# Patient Record
Sex: Female | Born: 1970 | Race: Black or African American | Hispanic: No | Marital: Single | Smoking: Current every day smoker
Health system: Southern US, Community
[De-identification: ages and names within clinical notes are randomized; demographics above are authoritative.]

## PROBLEM LIST (undated history)

## (undated) DIAGNOSIS — F329 Major depressive disorder, single episode, unspecified: Secondary | ICD-10-CM

## (undated) DIAGNOSIS — I10 Essential (primary) hypertension: Secondary | ICD-10-CM

## (undated) DIAGNOSIS — D649 Anemia, unspecified: Secondary | ICD-10-CM

## (undated) DIAGNOSIS — G2581 Restless legs syndrome: Secondary | ICD-10-CM

## (undated) DIAGNOSIS — E079 Disorder of thyroid, unspecified: Secondary | ICD-10-CM

## (undated) HISTORY — PX: APPENDECTOMY: SHX54

## (undated) HISTORY — PX: TUBAL LIGATION: SHX77

---

## 2019-12-29 DIAGNOSIS — I10 Essential (primary) hypertension: Secondary | ICD-10-CM

## 2019-12-29 DIAGNOSIS — E039 Hypothyroidism, unspecified: Secondary | ICD-10-CM | POA: Diagnosis present

## 2020-01-01 DIAGNOSIS — Z0000 Encounter for general adult medical examination without abnormal findings: Secondary | ICD-10-CM | POA: Insufficient documentation

## 2021-01-12 DIAGNOSIS — F331 Major depressive disorder, recurrent, moderate: Secondary | ICD-10-CM | POA: Insufficient documentation

## 2021-01-12 DIAGNOSIS — D649 Anemia, unspecified: Secondary | ICD-10-CM | POA: Insufficient documentation

## 2021-01-12 DIAGNOSIS — G4700 Insomnia, unspecified: Secondary | ICD-10-CM | POA: Insufficient documentation

## 2021-01-12 DIAGNOSIS — F17200 Nicotine dependence, unspecified, uncomplicated: Secondary | ICD-10-CM | POA: Insufficient documentation

## 2021-01-12 DIAGNOSIS — N393 Stress incontinence (female) (male): Secondary | ICD-10-CM | POA: Insufficient documentation

## 2021-01-13 DIAGNOSIS — D509 Iron deficiency anemia, unspecified: Secondary | ICD-10-CM | POA: Diagnosis present

## 2021-11-01 DIAGNOSIS — D219 Benign neoplasm of connective and other soft tissue, unspecified: Secondary | ICD-10-CM | POA: Diagnosis not present

## 2021-11-01 DIAGNOSIS — N921 Excessive and frequent menstruation with irregular cycle: Secondary | ICD-10-CM | POA: Diagnosis not present

## 2021-12-09 ENCOUNTER — Ambulatory Visit
Admission: EM | Admit: 2021-12-09 | Discharge: 2021-12-09 | Disposition: A | Discharge status: Home / Self Care | Payer: BC Managed Care – PPO | Attending: Physician Assistant | Admitting: Physician Assistant

## 2021-12-09 ENCOUNTER — Other Ambulatory Visit: Payer: Self-pay

## 2021-12-09 DIAGNOSIS — I10 Essential (primary) hypertension: Secondary | ICD-10-CM

## 2021-12-09 DIAGNOSIS — R110 Nausea: Secondary | ICD-10-CM

## 2021-12-09 DIAGNOSIS — F5083 Pica in adults: Secondary | ICD-10-CM

## 2021-12-09 DIAGNOSIS — G2581 Restless legs syndrome: Secondary | ICD-10-CM | POA: Diagnosis not present

## 2021-12-09 DIAGNOSIS — F5089 Other specified eating disorder: Secondary | ICD-10-CM | POA: Diagnosis not present

## 2021-12-09 HISTORY — DX: Essential (primary) hypertension: I10

## 2021-12-09 HISTORY — DX: Disorder of thyroid, unspecified: E07.9

## 2021-12-09 LAB — POCT URINE PREGNANCY: Preg Test, Ur: NEGATIVE

## 2021-12-09 NOTE — ED Provider Notes (Signed)
[facility]    CSN: [id number] Arrival date & time: 12/09/21  0858      History   Chief Complaint Chief Complaint  Patient presents with   Nausea    HPI Ashley Orr is a 51 y.o. female.   Patient here today for evaluation of 2-week history of nausea, vomiting.  She states that she has also felt more cold than she typically would and has had a tendency to eat ice.  She does have known uterine fibroids and states she just moved to this area and does need a GYN.  She has known hypertension and has not been taking her medications daily.  She does not have a PCP in this area.  She does have history of thyroid disease as well and has not had her TSH checked recently.  She reports that she has also had some restless legs recently.   The history is provided by the patient.   Past Medical History:  Diagnosis Date   Hypertension    Thyroid disease     Patient Active Problem List   Diagnosis Date Noted   Essential hypertension 12/09/2021    History reviewed. No pertinent surgical history.  OB History   No obstetric history on file.      Home Medications    Prior to Admission medications   Medication Sig Start Date End Date Taking? Authorizing Provider  cyclobenzaprine (FLEXERIL) 10 MG tablet Take 1 tablet by mouth 2 (two) times daily as needed. 12/29/19  Yes [provider]  hydrochlorothiazide (HYDRODIURIL) 25 MG tablet Take by mouth. 01/12/21  Yes [provider]  levothyroxine (SYNTHROID) 200 MCG tablet TAKE 1 TABLET(200 MCG) BY MOUTH DAILY AT 6 AM 12/29/19  Yes [provider]  losartan (COZAAR) 50 MG tablet Take by mouth. 12/29/19  Yes [provider]  oxybutynin (DITROPAN-XL) 5 MG 24 hr tablet Take by mouth. 01/12/21  Yes [provider]  traZODone (DESYREL) 100 MG tablet Take by mouth. 01/12/21  Yes [provider]  venlafaxine XR (EFFEXOR-XR) 37.5 MG 24 hr capsule Take by mouth. 12/29/19  Yes  [provider]  citalopram (CELEXA) 40 MG tablet Take by mouth.    [provider]    Family History History reviewed. No pertinent family history.  Social History Social History   Tobacco Use   Smoking status: Every Day    Types: Cigarettes   Smokeless tobacco: Never     Allergies   Codeine   Review of Systems Review of Systems  Constitutional:  Negative for chills and fever.  Eyes:  Negative for discharge and redness.  Respiratory:  Negative for shortness of breath and wheezing.   Cardiovascular:  Negative for chest pain.  Gastrointestinal:  Positive for nausea and vomiting. Negative for abdominal pain.    Physical Exam Triage Vital Signs ED Triage Vitals  Enc Vitals Group     BP      Pulse      Resp      Temp      Temp src      SpO2      Weight      Height      Head Circumference      Peak Flow      Pain Score      Pain Loc      Pain Edu?      Excl. in [place]?    No data found.  Updated Vital Signs BP (!) 158/100 (BP  Location: Left Arm) Comment: Bp meds not taken yet   Pulse 77    Temp 97.7 F (36.5 C) (Oral)    Resp 18    LMP 09/24/2021 (Exact Date)    SpO2 95%      Physical Exam Vitals and nursing note reviewed.  Constitutional:      General: She is not in acute distress.    Appearance: Normal appearance. She is not ill-appearing.  HENT:     Head: Normocephalic and atraumatic.  Eyes:     Conjunctiva/sclera: Conjunctivae normal.  Cardiovascular:     Rate and Rhythm: Normal rate and regular rhythm.     Heart sounds: Normal heart sounds. No murmur heard. Pulmonary:     Effort: Pulmonary effort is normal. No respiratory distress.     Breath sounds: Normal breath sounds. No wheezing, rhonchi or rales.  Neurological:     Mental Status: She is alert.  Psychiatric:        Mood and Affect: Mood normal.        Behavior: Behavior normal.        Thought Content: Thought content normal.     UC Treatments / Results  Labs (all  labs ordered are listed, but only abnormal results are displayed) Labs Reviewed  CBC WITH DIFFERENTIAL/PLATELET  COMPREHENSIVE METABOLIC PANEL  TSH  POCT URINE PREGNANCY    EKG   Radiology No results found.  Procedures Procedures (including critical care time)  Medications Ordered in UC Medications - No data to display  Initial Impression / Assessment and Plan / UC Course  I have reviewed the triage vital signs and the nursing notes.  Pertinent labs & imaging results that were available during my care of the patient were reviewed by me and considered in my medical decision making (see chart for details).    Pregnancy test negative in office. Will order other labs for further evaluation. Contact info given for local GYN and discussed using her MyChart to establish care with local PCP as well. Encouraged follow up with any further concerns.   Final Clinical Impressions(s) / UC Diagnoses   Final diagnoses:  Pica in adults  Nausea  Restless legs  Essential hypertension     Discharge Instructions       Please follow up with any further concerns.      ED Prescriptions   None    PDMP not reviewed this encounter.   Francene Finders, PA-C 12/09/21 1053

## 2021-12-09 NOTE — Discharge Instructions (Signed)
°  Please follow up with any further concerns.

## 2021-12-09 NOTE — ED Triage Notes (Addendum)
2 week h/o nausea, fatigue and emesis. Pt also complains of feeling cold more often than normal for her. She also chews on ice often. No meds taken. Pt would like to test for pregnancy. Notes h/o fibroids. Denies URI sxs and diarrhea.

## 2021-12-10 LAB — COMPREHENSIVE METABOLIC PANEL
ALT: 11 IU/L (ref 0–32)
AST: 15 IU/L (ref 0–40)
Albumin/Globulin Ratio: 1.5 (ref 1.2–2.2)
Albumin: 4 g/dL (ref 3.8–4.9)
Alkaline Phosphatase: 77 IU/L (ref 44–121)
BUN/Creatinine Ratio: 14 (ref 9–23)
BUN: 11 mg/dL (ref 6–24)
Bilirubin Total: <0.2 mg/dL (ref 0.0–1.2)
CO2: 21 mmol/L (ref 20–29)
Calcium: 9 mg/dL (ref 8.7–10.2)
Chloride: 104 mmol/L (ref 96–106)
Creatinine, Ser: 0.76 mg/dL (ref 0.57–1.00)
Globulin, Total: 2.7 g/dL (ref 1.5–4.5)
Glucose: 75 mg/dL (ref 70–99)
Potassium: 3.9 mmol/L (ref 3.5–5.2)
Sodium: 139 mmol/L (ref 134–144)
Total Protein: 6.7 g/dL (ref 6.0–8.5)
eGFR: 94 mL/min/{1.73_m2} (ref >59)

## 2021-12-10 LAB — CBC WITH DIFFERENTIAL/PLATELET
Basophils Absolute: 0 10*3/uL (ref 0.0–0.2)
Basos: 0 %
EOS (ABSOLUTE): 0.1 10*3/uL (ref 0.0–0.4)
Eos: 1 %
Hematocrit: 27.3 % — ABNORMAL LOW (ref 34.0–46.6)
Hemoglobin: 7.6 g/dL — ABNORMAL LOW (ref 11.1–15.9)
Immature Grans (Abs): 0 10*3/uL (ref 0.0–0.1)
Immature Granulocytes: 0 %
Lymphocytes Absolute: 1.6 10*3/uL (ref 0.7–3.1)
Lymphs: 21 %
MCH: 20.1 pg — ABNORMAL LOW (ref 26.6–33.0)
MCHC: 27.8 g/dL — ABNORMAL LOW (ref 31.5–35.7)
MCV: 72 fL — ABNORMAL LOW (ref 79–97)
Monocytes Absolute: 0.7 10*3/uL (ref 0.1–0.9)
Monocytes: 9 %
Neutrophils Absolute: 5.1 10*3/uL (ref 1.4–7.0)
Neutrophils: 69 %
Platelets: 217 10*3/uL (ref 150–450)
RBC: 3.78 x10E6/uL (ref 3.77–5.28)
RDW: 17.7 % — ABNORMAL HIGH (ref 11.7–15.4)
WBC: 7.5 10*3/uL (ref 3.4–10.8)

## 2021-12-10 LAB — TSH: TSH: 24.8 u[IU]/mL — ABNORMAL HIGH (ref 0.450–4.500)

## 2021-12-16 ENCOUNTER — Other Ambulatory Visit: Payer: Self-pay

## 2021-12-16 ENCOUNTER — Ambulatory Visit
Admission: EM | Admit: 2021-12-16 | Discharge: 2021-12-16 | Disposition: A | Discharge status: Home / Self Care | Payer: BC Managed Care – PPO

## 2021-12-16 ENCOUNTER — Encounter (HOSPITAL_BASED_OUTPATIENT_CLINIC_OR_DEPARTMENT_OTHER): Payer: Self-pay | Admitting: Emergency Medicine

## 2021-12-16 DIAGNOSIS — B9562 Methicillin resistant Staphylococcus aureus infection as the cause of diseases classified elsewhere: Secondary | ICD-10-CM | POA: Diagnosis present

## 2021-12-16 DIAGNOSIS — E669 Obesity, unspecified: Secondary | ICD-10-CM | POA: Diagnosis present

## 2021-12-16 DIAGNOSIS — Z885 Allergy status to narcotic agent status: Secondary | ICD-10-CM

## 2021-12-16 DIAGNOSIS — R220 Localized swelling, mass and lump, head: Secondary | ICD-10-CM | POA: Diagnosis not present

## 2021-12-16 DIAGNOSIS — L03211 Cellulitis of face: Principal | ICD-10-CM | POA: Diagnosis present

## 2021-12-16 DIAGNOSIS — F17210 Nicotine dependence, cigarettes, uncomplicated: Secondary | ICD-10-CM | POA: Diagnosis present

## 2021-12-16 DIAGNOSIS — D500 Iron deficiency anemia secondary to blood loss (chronic): Secondary | ICD-10-CM | POA: Diagnosis present

## 2021-12-16 DIAGNOSIS — Z79899 Other long term (current) drug therapy: Secondary | ICD-10-CM

## 2021-12-16 DIAGNOSIS — F419 Anxiety disorder, unspecified: Secondary | ICD-10-CM | POA: Diagnosis present

## 2021-12-16 DIAGNOSIS — Z20822 Contact with and (suspected) exposure to covid-19: Secondary | ICD-10-CM | POA: Diagnosis not present

## 2021-12-16 DIAGNOSIS — D259 Leiomyoma of uterus, unspecified: Secondary | ICD-10-CM | POA: Diagnosis not present

## 2021-12-16 DIAGNOSIS — E039 Hypothyroidism, unspecified: Secondary | ICD-10-CM | POA: Diagnosis present

## 2021-12-16 DIAGNOSIS — Z79890 Hormone replacement therapy (postmenopausal): Secondary | ICD-10-CM | POA: Diagnosis not present

## 2021-12-16 DIAGNOSIS — N920 Excessive and frequent menstruation with regular cycle: Secondary | ICD-10-CM | POA: Diagnosis present

## 2021-12-16 DIAGNOSIS — F32A Depression, unspecified: Secondary | ICD-10-CM | POA: Diagnosis present

## 2021-12-16 DIAGNOSIS — Z6836 Body mass index (BMI) 36.0-36.9, adult: Secondary | ICD-10-CM | POA: Diagnosis not present

## 2021-12-16 DIAGNOSIS — Z87892 Personal history of anaphylaxis: Secondary | ICD-10-CM | POA: Diagnosis not present

## 2021-12-16 DIAGNOSIS — M272 Inflammatory conditions of jaws: Secondary | ICD-10-CM | POA: Diagnosis not present

## 2021-12-16 DIAGNOSIS — I10 Essential (primary) hypertension: Secondary | ICD-10-CM | POA: Diagnosis present

## 2021-12-16 LAB — COMPREHENSIVE METABOLIC PANEL
ALT: 8 U/L (ref 0–44)
AST: 13 U/L — ABNORMAL LOW (ref 15–41)
Albumin: 4 g/dL (ref 3.5–5.0)
Alkaline Phosphatase: 64 U/L (ref 38–126)
Anion gap: 9 (ref 5–15)
BUN: 12 mg/dL (ref 6–20)
CO2: 22 mmol/L (ref 22–32)
Calcium: 9 mg/dL (ref 8.9–10.3)
Chloride: 107 mmol/L (ref 98–111)
Creatinine, Ser: 0.91 mg/dL (ref 0.44–1.00)
GFR, Estimated: >60 mL/min (ref >60)
Glucose, Bld: 118 mg/dL — ABNORMAL HIGH (ref 70–99)
Potassium: 3.7 mmol/L (ref 3.5–5.1)
Sodium: 138 mmol/L (ref 135–145)
Total Bilirubin: 0.2 mg/dL — ABNORMAL LOW (ref 0.3–1.2)
Total Protein: 7.2 g/dL (ref 6.5–8.1)

## 2021-12-16 LAB — CBC WITH DIFFERENTIAL/PLATELET
Abs Immature Granulocytes: 0.03 10*3/uL (ref 0.00–0.07)
Basophils Absolute: 0.1 10*3/uL (ref 0.0–0.1)
Basophils Relative: 1 %
Eosinophils Absolute: 0.1 10*3/uL (ref 0.0–0.5)
Eosinophils Relative: 1 %
HCT: 27.8 % — ABNORMAL LOW (ref 36.0–46.0)
Hemoglobin: 7.8 g/dL — ABNORMAL LOW (ref 12.0–15.0)
Immature Granulocytes: 0 %
Lymphocytes Relative: 18 %
Lymphs Abs: 1.9 10*3/uL (ref 0.7–4.0)
MCH: 19.7 pg — ABNORMAL LOW (ref 26.0–34.0)
MCHC: 28.1 g/dL — ABNORMAL LOW (ref 30.0–36.0)
MCV: 70.4 fL — ABNORMAL LOW (ref 80.0–100.0)
Monocytes Absolute: 0.8 10*3/uL (ref 0.1–1.0)
Monocytes Relative: 8 %
Neutro Abs: 7.8 10*3/uL — ABNORMAL HIGH (ref 1.7–7.7)
Neutrophils Relative %: 72 %
Platelets: 274 10*3/uL (ref 150–400)
RBC: 3.95 MIL/uL (ref 3.87–5.11)
RDW: 19.5 % — ABNORMAL HIGH (ref 11.5–15.5)
WBC: 10.7 10*3/uL — ABNORMAL HIGH (ref 4.0–10.5)
nRBC: 0 % (ref 0.0–0.2)

## 2021-12-16 MED ORDER — IBUPROFEN 400 MG PO TABS
400.0000 mg | ORAL_TABLET | Freq: Once | ORAL | Status: AC | PRN
  Administered 2021-12-16: 400 mg via ORAL
  Filled 2021-12-16: qty 1

## 2021-12-16 NOTE — Discharge Instructions (Signed)
Please go to the emergency department as soon as you leave urgent care for further evaluation and management. ?

## 2021-12-16 NOTE — ED Provider Notes (Signed)
[facility]    CSN: [id number] Arrival date & time: 12/16/21  1643      History   Chief Complaint Chief Complaint  Patient presents with   Abscess    HPI Ashley Orr is a 51 y.o. female.   Patient presents with abscess to left jaw that started approximately 5 days ago.  Patient reports that it started off small, and she decided to pop it with some drainage.  It has persistently gotten bigger since it originated.  Denies any fevers, body aches, chills.  Has had some purulent drainage.   Abscess  Past Medical History:  Diagnosis Date   Hypertension    Thyroid disease     Patient Active Problem List   Diagnosis Date Noted   Essential hypertension 12/09/2021    History reviewed. No pertinent surgical history.  OB History   No obstetric history on file.      Home Medications    Prior to Admission medications   Medication Sig Start Date End Date Taking? Authorizing Provider  citalopram (CELEXA) 40 MG tablet Take by mouth.    [provider]  cyclobenzaprine (FLEXERIL) 10 MG tablet Take 1 tablet by mouth 2 (two) times daily as needed. 12/29/19   [provider]  hydrochlorothiazide (HYDRODIURIL) 25 MG tablet Take by mouth. 01/12/21   [provider]  levothyroxine (SYNTHROID) 200 MCG tablet TAKE 1 TABLET(200 MCG) BY MOUTH DAILY AT 6 AM 12/29/19   [provider]  losartan (COZAAR) 50 MG tablet Take by mouth. 12/29/19   [provider]  oxybutynin (DITROPAN-XL) 5 MG 24 hr tablet Take by mouth. 01/12/21   [provider]  traZODone (DESYREL) 100 MG tablet Take by mouth. 01/12/21   [provider]  venlafaxine XR (EFFEXOR-XR) 37.5 MG 24 hr capsule Take by mouth. 12/29/19   [provider]    Family History History reviewed. No pertinent family history.  Social History Social History   Tobacco Use   Smoking status: Every Day    Types: Cigarettes   Smokeless tobacco: Never      Allergies   Codeine   Review of Systems Review of Systems Per HPI  Physical Exam Triage Vital Signs ED Triage Vitals [12/16/21 1745]  Enc Vitals Group     BP (!) 156/88     Pulse Rate 95     Resp 18     Temp 99.2 F (37.3 C)     Temp Source Oral     SpO2 98 %     Weight      Height      Head Circumference      Peak Flow      Pain Score 0     Pain Loc      Pain Edu?      Excl. in [place]?    No data found.  Updated Vital Signs BP (!) 156/88 (BP Location: Left Arm)    Pulse 95    Temp 99.2 F (37.3 C) (Oral)    Resp 18    SpO2 98%   Visual Acuity Right Eye Distance:   Left Eye Distance:   Bilateral Distance:    Right Eye Near:   Left Eye Near:    Bilateral Near:     Physical Exam Constitutional:      General: She is not in acute distress.    Appearance: Normal appearance. She is not toxic-appearing or diaphoretic.  HENT:     Head: Normocephalic and  atraumatic.  Eyes:     Extraocular Movements: Extraocular movements intact.     Conjunctiva/sclera: Conjunctivae normal.  Pulmonary:     Effort: Pulmonary effort is normal.  Skin:    General: Skin is warm and dry.     Comments: Patient has approximately 2.5 cm indurated abscess present to left chin.  No drainage noted at this time.  Patient is able to open jaw.   Neurological:     General: No focal deficit present.     Mental Status: She is alert and oriented to person, place, and time. Mental status is at baseline.  Psychiatric:        Mood and Affect: Mood normal.        Behavior: Behavior normal.        Thought Content: Thought content normal.        Judgment: Judgment normal.     UC Treatments / Results  Labs (all labs ordered are listed, but only abnormal results are displayed) Labs Reviewed - No data to display  EKG   Radiology No results found.  Procedures Procedures (including critical care time)  Medications Ordered in UC Medications - No data to display  Initial Impression /  Assessment and Plan / UC Course  I have reviewed the triage vital signs and the nursing notes.  Pertinent labs & imaging results that were available during my care of the patient were reviewed by me and considered in my medical decision making (see chart for details).     Patient has abscess to left jaw.  It does appear that it will need drainage.  Unable to perform drainage of this abscess given location at urgent care given safety risk.  Advised patient that she would need to go to the hospital for more extensive evaluation and treatment for abscess.  Patient was agreeable with plan.  Vital signs stable at discharge.  Agree with patient self transport to the hospital. Final Clinical Impressions(s) / UC Diagnoses   Final diagnoses:  Abscess, jaw     Discharge Instructions      Please go to the emergency department as soon as you leave urgent care for further evaluation and management.    ED Prescriptions   None    PDMP not reviewed this encounter.   Teodora Medici, [place] 12/16/21 1816

## 2021-12-16 NOTE — ED Triage Notes (Signed)
Pt c/o abscess to left jaw just under her lip. States she tried "popping" it and successfully drained the discharge however it is still present, it's hard, and painful.   Onset ~ Monday

## 2021-12-16 NOTE — ED Triage Notes (Signed)
Pt presents for abscess on L chin that began Monday and become progressively worse. Now experiencing swelling to L chin, neck, trouble swallowing, drooling (not noted during triage exam), pain (10/10), yellow drainage. Has tried warm compresses.

## 2021-12-17 ENCOUNTER — Inpatient Hospital Stay (HOSPITAL_BASED_OUTPATIENT_CLINIC_OR_DEPARTMENT_OTHER)
Admission: EM | Admit: 2021-12-17 | Discharge: 2021-12-19 | DRG: 603 | Disposition: A | Discharge status: Home / Self Care | Payer: BC Managed Care – PPO | Attending: Internal Medicine | Admitting: Internal Medicine

## 2021-12-17 ENCOUNTER — Encounter (HOSPITAL_BASED_OUTPATIENT_CLINIC_OR_DEPARTMENT_OTHER): Payer: Self-pay | Admitting: Family Medicine

## 2021-12-17 ENCOUNTER — Emergency Department (HOSPITAL_BASED_OUTPATIENT_CLINIC_OR_DEPARTMENT_OTHER): Payer: BC Managed Care – PPO

## 2021-12-17 DIAGNOSIS — R220 Localized swelling, mass and lump, head: Secondary | ICD-10-CM | POA: Diagnosis not present

## 2021-12-17 DIAGNOSIS — B9562 Methicillin resistant Staphylococcus aureus infection as the cause of diseases classified elsewhere: Secondary | ICD-10-CM | POA: Diagnosis present

## 2021-12-17 DIAGNOSIS — Z6836 Body mass index (BMI) 36.0-36.9, adult: Secondary | ICD-10-CM | POA: Diagnosis not present

## 2021-12-17 DIAGNOSIS — F17210 Nicotine dependence, cigarettes, uncomplicated: Secondary | ICD-10-CM | POA: Diagnosis present

## 2021-12-17 DIAGNOSIS — Z20822 Contact with and (suspected) exposure to covid-19: Secondary | ICD-10-CM | POA: Diagnosis present

## 2021-12-17 DIAGNOSIS — Z87892 Personal history of anaphylaxis: Secondary | ICD-10-CM | POA: Diagnosis not present

## 2021-12-17 DIAGNOSIS — D500 Iron deficiency anemia secondary to blood loss (chronic): Secondary | ICD-10-CM | POA: Diagnosis present

## 2021-12-17 DIAGNOSIS — L03211 Cellulitis of face: Secondary | ICD-10-CM | POA: Diagnosis not present

## 2021-12-17 DIAGNOSIS — E669 Obesity, unspecified: Secondary | ICD-10-CM | POA: Diagnosis present

## 2021-12-17 DIAGNOSIS — Z79899 Other long term (current) drug therapy: Secondary | ICD-10-CM | POA: Diagnosis not present

## 2021-12-17 DIAGNOSIS — N920 Excessive and frequent menstruation with regular cycle: Secondary | ICD-10-CM | POA: Diagnosis present

## 2021-12-17 DIAGNOSIS — D509 Iron deficiency anemia, unspecified: Secondary | ICD-10-CM | POA: Diagnosis present

## 2021-12-17 DIAGNOSIS — Z79890 Hormone replacement therapy (postmenopausal): Secondary | ICD-10-CM | POA: Diagnosis not present

## 2021-12-17 DIAGNOSIS — I10 Essential (primary) hypertension: Secondary | ICD-10-CM | POA: Diagnosis present

## 2021-12-17 DIAGNOSIS — F419 Anxiety disorder, unspecified: Secondary | ICD-10-CM | POA: Diagnosis present

## 2021-12-17 DIAGNOSIS — D259 Leiomyoma of uterus, unspecified: Secondary | ICD-10-CM | POA: Diagnosis present

## 2021-12-17 DIAGNOSIS — F32A Depression, unspecified: Secondary | ICD-10-CM | POA: Diagnosis present

## 2021-12-17 DIAGNOSIS — E039 Hypothyroidism, unspecified: Secondary | ICD-10-CM | POA: Diagnosis present

## 2021-12-17 DIAGNOSIS — Z885 Allergy status to narcotic agent status: Secondary | ICD-10-CM | POA: Diagnosis not present

## 2021-12-17 HISTORY — DX: Hypothyroidism, unspecified: E03.9

## 2021-12-17 LAB — RESP PANEL BY RT-PCR (FLU A&B, COVID) ARPGX2
Influenza A by PCR: NEGATIVE
Influenza B by PCR: NEGATIVE
SARS Coronavirus 2 by RT PCR: NEGATIVE

## 2021-12-17 LAB — LACTIC ACID, PLASMA: Lactic Acid, Venous: 0.6 mmol/L (ref 0.5–1.9)

## 2021-12-17 LAB — HIV ANTIBODY (ROUTINE TESTING W REFLEX): HIV Screen 4th Generation wRfx: NONREACTIVE

## 2021-12-17 MED ORDER — SODIUM CHLORIDE 0.9% FLUSH
10.0000 mL | Freq: Two times a day (BID) | INTRAVENOUS | Status: DC
  Administered 2021-12-17 – 2021-12-19 (×4): 10 mL

## 2021-12-17 MED ORDER — LEVOTHYROXINE SODIUM 100 MCG PO TABS
200.0000 ug | ORAL_TABLET | ORAL | Status: DC
  Administered 2021-12-18: 200 ug via ORAL
  Filled 2021-12-17 (×2): qty 2

## 2021-12-17 MED ORDER — BISACODYL 5 MG PO TBEC: 5.0000 mg | DELAYED_RELEASE_TABLET | Freq: Every day | ORAL | Status: DC | PRN

## 2021-12-17 MED ORDER — DOCUSATE SODIUM 100 MG PO CAPS
100.0000 mg | ORAL_CAPSULE | Freq: Two times a day (BID) | ORAL | Status: DC
  Administered 2021-12-17 – 2021-12-19 (×2): 100 mg via ORAL
  Filled 2021-12-17 (×3): qty 1

## 2021-12-17 MED ORDER — HYDRALAZINE HCL 20 MG/ML IJ SOLN: 5.0000 mg | INTRAMUSCULAR | Status: DC | PRN

## 2021-12-17 MED ORDER — VANCOMYCIN HCL 2000 MG/400ML IV SOLN
2000.0000 mg | Freq: Once | INTRAVENOUS | Status: DC
  Filled 2021-12-17: qty 400

## 2021-12-17 MED ORDER — LOSARTAN POTASSIUM 50 MG PO TABS
50.0000 mg | ORAL_TABLET | Freq: Every day | ORAL | Status: DC
  Administered 2021-12-17 – 2021-12-19 (×3): 50 mg via ORAL
  Filled 2021-12-17 (×3): qty 1

## 2021-12-17 MED ORDER — CHLORHEXIDINE GLUCONATE CLOTH 2 % EX PADS
6.0000 | MEDICATED_PAD | Freq: Every day | CUTANEOUS | Status: DC
  Administered 2021-12-17: 6 via TOPICAL

## 2021-12-17 MED ORDER — VANCOMYCIN HCL IN DEXTROSE 1-5 GM/200ML-% IV SOLN
1000.0000 mg | Freq: Once | INTRAVENOUS | Status: AC
  Administered 2021-12-17: 1000 mg via INTRAVENOUS
  Filled 2021-12-17: qty 200

## 2021-12-17 MED ORDER — ONDANSETRON HCL 4 MG PO TABS: 4.0000 mg | ORAL_TABLET | Freq: Four times a day (QID) | ORAL | Status: DC | PRN

## 2021-12-17 MED ORDER — SODIUM CHLORIDE 0.9 % IV SOLN
2.0000 g | Freq: Three times a day (TID) | INTRAVENOUS | Status: DC
  Administered 2021-12-17 – 2021-12-18 (×3): 2 g via INTRAVENOUS
  Filled 2021-12-17 (×3): qty 2

## 2021-12-17 MED ORDER — LEVOTHYROXINE SODIUM 75 MCG PO TABS
175.0000 ug | ORAL_TABLET | ORAL | Status: DC
  Administered 2021-12-17 – 2021-12-19 (×2): 175 ug via ORAL
  Filled 2021-12-17 (×3): qty 1

## 2021-12-17 MED ORDER — ENOXAPARIN SODIUM 40 MG/0.4ML IJ SOSY
40.0000 mg | PREFILLED_SYRINGE | INTRAMUSCULAR | Status: DC
  Administered 2021-12-17 – 2021-12-18 (×2): 40 mg via SUBCUTANEOUS
  Filled 2021-12-17 (×2): qty 0.4

## 2021-12-17 MED ORDER — SODIUM CHLORIDE 0.9 % IV SOLN
2.0000 g | Freq: Three times a day (TID) | INTRAVENOUS | Status: DC
  Filled 2021-12-17: qty 2

## 2021-12-17 MED ORDER — ACETAMINOPHEN 325 MG PO TABS
650.0000 mg | ORAL_TABLET | Freq: Four times a day (QID) | ORAL | Status: DC | PRN
  Administered 2021-12-17 – 2021-12-18 (×3): 650 mg via ORAL
  Filled 2021-12-17 (×3): qty 2

## 2021-12-17 MED ORDER — ONDANSETRON HCL 4 MG/2ML IJ SOLN: 4.0000 mg | Freq: Four times a day (QID) | INTRAMUSCULAR | Status: DC | PRN

## 2021-12-17 MED ORDER — HYDROCHLOROTHIAZIDE 25 MG PO TABS
25.0000 mg | ORAL_TABLET | Freq: Every day | ORAL | Status: DC
  Administered 2021-12-17 – 2021-12-19 (×3): 25 mg via ORAL
  Filled 2021-12-17 (×3): qty 1

## 2021-12-17 MED ORDER — POLYETHYLENE GLYCOL 3350 17 G PO PACK: 17.0000 g | PACK | Freq: Every day | ORAL | Status: DC | PRN

## 2021-12-17 MED ORDER — CYCLOBENZAPRINE HCL 10 MG PO TABS
10.0000 mg | ORAL_TABLET | Freq: Two times a day (BID) | ORAL | Status: DC | PRN
  Administered 2021-12-18: 10 mg via ORAL
  Filled 2021-12-17 (×2): qty 1

## 2021-12-17 MED ORDER — KETOROLAC TROMETHAMINE 15 MG/ML IJ SOLN
15.0000 mg | Freq: Once | INTRAMUSCULAR | Status: AC
  Administered 2021-12-17: 15 mg via INTRAVENOUS
  Filled 2021-12-17: qty 1

## 2021-12-17 MED ORDER — SODIUM CHLORIDE 0.9 % IV SOLN
2.0000 g | INTRAVENOUS | Status: AC
  Administered 2021-12-17: 2 g via INTRAVENOUS
  Filled 2021-12-17: qty 2

## 2021-12-17 MED ORDER — VENLAFAXINE HCL ER 37.5 MG PO CP24
37.5000 mg | ORAL_CAPSULE | Freq: Every day | ORAL | Status: DC
  Administered 2021-12-17 – 2021-12-19 (×3): 37.5 mg via ORAL
  Filled 2021-12-17 (×3): qty 1

## 2021-12-17 MED ORDER — OXYBUTYNIN CHLORIDE ER 5 MG PO TB24
5.0000 mg | ORAL_TABLET | Freq: Every evening | ORAL | Status: DC
  Administered 2021-12-17 – 2021-12-18 (×2): 5 mg via ORAL
  Filled 2021-12-17 (×3): qty 1

## 2021-12-17 MED ORDER — NICOTINE 14 MG/24HR TD PT24: 14.0000 mg | MEDICATED_PATCH | Freq: Every day | TRANSDERMAL | Status: DC | PRN

## 2021-12-17 MED ORDER — SODIUM CHLORIDE 0.9% FLUSH: 10.0000 mL | INTRAVENOUS | Status: DC | PRN

## 2021-12-17 MED ORDER — TRAZODONE HCL 100 MG PO TABS
100.0000 mg | ORAL_TABLET | Freq: Every day | ORAL | Status: DC
  Administered 2021-12-17 – 2021-12-18 (×2): 100 mg via ORAL
  Filled 2021-12-17 (×2): qty 1

## 2021-12-17 MED ORDER — VANCOMYCIN HCL 1250 MG/250ML IV SOLN
1250.0000 mg | INTRAVENOUS | Status: DC
  Administered 2021-12-18 – 2021-12-19 (×2): 1250 mg via INTRAVENOUS
  Filled 2021-12-17 (×2): qty 250

## 2021-12-17 MED ORDER — KETOROLAC TROMETHAMINE 15 MG/ML IJ SOLN
15.0000 mg | Freq: Four times a day (QID) | INTRAMUSCULAR | Status: DC | PRN
  Administered 2021-12-17 – 2021-12-19 (×5): 15 mg via INTRAVENOUS
  Filled 2021-12-17 (×5): qty 1

## 2021-12-17 MED ORDER — IOHEXOL 300 MG/ML  SOLN
100.0000 mL | Freq: Once | INTRAMUSCULAR | Status: AC | PRN
  Administered 2021-12-17: 75 mL via INTRAVENOUS

## 2021-12-17 MED ORDER — ACETAMINOPHEN 650 MG RE SUPP: 650.0000 mg | Freq: Four times a day (QID) | RECTAL | Status: DC | PRN

## 2021-12-17 MED ORDER — FERROUS SULFATE 325 (65 FE) MG PO TABS
325.0000 mg | ORAL_TABLET | Freq: Every day | ORAL | Status: DC
  Administered 2021-12-17 – 2021-12-19 (×3): 325 mg via ORAL
  Filled 2021-12-17 (×3): qty 1

## 2021-12-17 MED ORDER — KETOROLAC TROMETHAMINE 60 MG/2ML IM SOLN
30.0000 mg | Freq: Once | INTRAMUSCULAR | Status: AC
Start: 2021-12-17 — End: 2021-12-17
  Administered 2021-12-17: 30 mg via INTRAMUSCULAR
  Filled 2021-12-17: qty 2

## 2021-12-17 MED ORDER — CITALOPRAM HYDROBROMIDE 40 MG PO TABS
40.0000 mg | ORAL_TABLET | Freq: Every day | ORAL | Status: DC
  Administered 2021-12-17 – 2021-12-19 (×3): 40 mg via ORAL
  Filled 2021-12-17 (×3): qty 1

## 2021-12-17 NOTE — ED Notes (Signed)
Note about patient being called with no answer was made in error

## 2021-12-17 NOTE — ED Notes (Signed)
Handoff report given to [facility] 

## 2021-12-17 NOTE — H&P (Signed)
History and Physical    Patient: Ashley Orr HEN:[id number] DOB: [birth year] DOA: 12/17/2021 DOS: the patient was seen and examined on 12/17/2021 PCP: Pcp, No  Patient coming from: Home - lives with fiance; NOKValerie Roys, [phone]   Chief Complaint: Jaw abscess  HPI: Ashley Orr is a 51 y.o. female with medical history significant of HTN and hypothyroidism presenting with jaw abscess.  She reports that she was living in [place] and recently moved to [place]; she has not established care here.  She reports that she has several bad teeth and developed a left lower posterior tooth abscess recently; it resolved and now she has a skin tag there.  Meanwhile, she noticed a pimple on her L mandibular region on Monday and it got worse.  It came to a head and she popped it with some relief, but then it started getting swollen, firm, and red in the region with pain.  The redness and swelling extended onto her neck and she decided to go to urgent care and then was sent to the ER.  No fevers.    ER Course:  [facility] transfer, per Dr. Tonie Griffith:  51 yo with hx of HTN, thyroid disease presents with facial cellulitis. No abscess on CT scan. Has worsened over past week. Airway patent with no respiratory compromise.      Review of Systems: As mentioned in the history of present illness. All other systems reviewed and are negative. Past Medical History:  Diagnosis Date   Hypertension    Hypothyroidism (acquired) 12/17/2021   Thyroid disease    History reviewed. No pertinent surgical history. Social History:  reports that she has been smoking cigarettes. She has never used smokeless tobacco. No history on file for alcohol use and drug use.  Allergies  Allergen Reactions   Codeine Anaphylaxis    History reviewed. No pertinent family history.  Prior to Admission medications   Medication Sig Start Date End Date Taking? Authorizing Provider  citalopram  (CELEXA) 40 MG tablet Take by mouth.    [provider]  cyclobenzaprine (FLEXERIL) 10 MG tablet Take 1 tablet by mouth 2 (two) times daily as needed. 12/29/19   [provider]  hydrochlorothiazide (HYDRODIURIL) 25 MG tablet Take by mouth. 01/12/21   [provider]  levothyroxine (SYNTHROID) 200 MCG tablet TAKE 1 TABLET(200 MCG) BY MOUTH DAILY AT 6 AM 12/29/19   [provider]  losartan (COZAAR) 50 MG tablet Take by mouth. 12/29/19   [provider]  oxybutynin (DITROPAN-XL) 5 MG 24 hr tablet Take by mouth. 01/12/21   [provider]  traZODone (DESYREL) 100 MG tablet Take by mouth. 01/12/21   [provider]  venlafaxine XR (EFFEXOR-XR) 37.5 MG 24 hr capsule Take by mouth. 12/29/19   [provider]    Physical Exam: Vitals:   12/17/21 0205 12/17/21 0243 12/17/21 0730 12/17/21 1028  BP: (!) 166/106 (!) 149/93 (!) 154/103 (!) 162/88  Pulse: 80 81 78 70  Resp: 18 18 18 18   Temp:    98 F (36.7 C)  TempSrc:    Oral  SpO2: 100% 100% 100% 100%  Weight:       General:  Appears calm and comfortable and is in NAD Eyes:  EOMI, normal lids, iris ENT:  grossly normal hearing, lips & tongue, mmm; poor dentition without apparent abscess; L mandibular pustule with drainage and surrounding erythema/edema/fluctuance      Neck:  no LAD, masses or thyromegaly; small  chronic nodule on R submandibular region; mild erythema extending onto neck Cardiovascular:  RRR, no m/r/g. No LE edema.  Respiratory:   CTA bilaterally with no wheezes/rales/rhonchi.  Normal respiratory effort. Abdomen:  soft, NT, ND Skin:  pustule on L chin, as noted above, with surrounding erythema; no other lesions noted Musculoskeletal:  grossly normal tone BUE/BLE, good ROM, no bony abnormality Psychiatric:  grossly normal mood and affect, speech fluent and appropriate, AOx3 Neurologic:  CN 2-12 grossly intact, moves all extremities in coordinated  fashion   Radiological Exams on Admission: Independently reviewed - see discussion in A/P where applicable  CT Maxillofacial W Contrast  Result Date: 12/17/2021 CLINICAL DATA:  Facial abscess EXAM: CT MAXILLOFACIAL WITH CONTRAST TECHNIQUE: Multidetector CT imaging of the maxillofacial structures was performed with intravenous contrast. Multiplanar CT image reconstructions were also generated. RADIATION DOSE REDUCTION: This exam was performed according to the departmental dose-optimization program which includes automated exposure control, adjustment of the mA and/or kV according to patient size and/or use of iterative reconstruction technique. CONTRAST:  54mL OMNIPAQUE IOHEXOL 300 MG/ML  SOLN COMPARISON:  None. FINDINGS: Osseous: No facial or mandibular fracture. Orbits: The globes are intact. Normal appearance of the intra- and extraconal fat. Symmetric extraocular muscles. Sinuses: No fluid levels or advanced mucosal thickening. Soft tissues: Left submandibular cutaneous lesion with underlying edema. No fluid collection. Limited intracranial: Normal. Other: None. IMPRESSION: Left submandibular cutaneous lesion with underlying edema, likely cellulitis. No fluid collection. Electronically Signed   By: Ulyses Jarred M.D.   On: 12/17/2021 01:47    EKG: not done   Labs on Admission: I have personally reviewed the available labs and imaging studies at the time of the admission.  Pertinent labs:    Glucose 118 WBC 10.7 Hgb 7.8 - stable MCH 70.4, RDW 19.5 TSH 24.8 on 1/20 COVID/flu negative   Assessment/Plan * Cellulitis of face- (present on admission) -Patient without prior h/o abscess presenting with pustule along left chin and surrounding cellulitis -Imaging does not show concern for abscess formation and pustule is already draining -She was given Vanc and cefepime in the ER, will continue for now -Will admit to Med Surg for ongoing abx therapy  Iron deficiency anemia due to chronic  blood loss- (present on admission) -Known uterine fibroids -Hgb 7.8 (stable from 1/20) and she started her menses today -She often feels fatigued with menses -She started taking FeSO4 yesterday, will continue -Needs outpatient GYN f/u  Hypothyroidism (acquired)- (present on admission) -She had not been taking Synthroid and so recent TSH was markedly abnormal -Medication has been resumed -Needs repeat thyroid testing in 4-6 weeks  Essential hypertension- (present on admission) -Continue Cozaar, HCTZ -Needs outpatient PCP, TOC consult requested    Advance Care Planning:   Code Status: Full Code   Consults: TOC team  Family Communication: None present; her fiance was present on the telephone throughout most of our visit  Severity of Illness: The appropriate patient status for this patient is INPATIENT. Inpatient status is judged to be reasonable and necessary in order to provide the required intensity of service to ensure the patient's safety. [name] presenting symptoms, physical exam findings, and initial radiographic and laboratory data in the context of their chronic comorbidities is felt to place them at high risk for further clinical deterioration. Furthermore, it is not anticipated that the patient will be medically stable for discharge from the hospital within 2 midnights of admission.   * I certify that at the point of admission it is  my clinical judgment that the patient will require inpatient hospital care spanning beyond 2 midnights from the point of admission due to high intensity of service, high risk for further deterioration and high frequency of surveillance required.*  Author: Karmen Bongo, MD 12/17/2021 12:22 PM  For on call review [email or web address].

## 2021-12-17 NOTE — Assessment & Plan Note (Signed)
-  She had not been taking Synthroid and so recent TSH was markedly abnormal -Medication has been resumed -Needs repeat thyroid testing in 4-6 weeks

## 2021-12-17 NOTE — ED Provider Notes (Signed)
[facility] EMERGENCY DEPT Provider Note   CSN: [id number] Arrival date & time: 12/16/21  1902     History    Ashley Orr is a 51 y.o. female.  The history is provided by the patient.  Illness Location:  Chin and submandibular space Quality:  Redness warmth and swelling Severity:  Moderate Onset quality:  Gradual Duration:  5 days Timing:  Constant Progression:  Worsening Chronicity:  New Context:  Popped a lesion on the chin and then there was drainage and swelling that is now extending to the neck Relieved by:  Nothing Worsened by:  Time Ineffective treatments:  Popping the lesions and warm compresses Associated symptoms: no fever, no rhinorrhea, no vomiting and no wheezing   Risk factors:  None     Home Medications Prior to Admission medications   Medication Sig Start Date End Date Taking? Authorizing Provider  citalopram (CELEXA) 40 MG tablet Take by mouth.    [provider]  cyclobenzaprine (FLEXERIL) 10 MG tablet Take 1 tablet by mouth 2 (two) times daily as needed. 12/29/19   [provider]  hydrochlorothiazide (HYDRODIURIL) 25 MG tablet Take by mouth. 01/12/21   [provider]  levothyroxine (SYNTHROID) 200 MCG tablet TAKE 1 TABLET(200 MCG) BY MOUTH DAILY AT 6 AM 12/29/19   [provider]  losartan (COZAAR) 50 MG tablet Take by mouth. 12/29/19   [provider]  oxybutynin (DITROPAN-XL) 5 MG 24 hr tablet Take by mouth. 01/12/21   [provider]  traZODone (DESYREL) 100 MG tablet Take by mouth. 01/12/21   [provider]  venlafaxine XR (EFFEXOR-XR) 37.5 MG 24 hr capsule Take by mouth. 12/29/19   [provider]      Allergies    Codeine    Review of Systems   Review of Systems  Constitutional:  Negative for fever.  HENT:  Positive for facial swelling. Negative for rhinorrhea.   Eyes:  Negative for photophobia.  Respiratory:  Negative for wheezing.    Cardiovascular:  Negative for leg swelling.  Gastrointestinal:  Negative for vomiting.  Musculoskeletal:  Negative for neck stiffness.  Skin:  Positive for color change.  Neurological:  Negative for dizziness.  Psychiatric/Behavioral:  Negative for agitation.   All other systems reviewed and are negative.  Physical Exam Updated Vital Signs BP (!) 149/93    Pulse 81    Temp (!) 97.4 F (36.3 C)    Resp 18    Wt 91.6 kg    LMP 09/24/2021 (Exact Date) Comment: "I had a neg preg test last friday though"   SpO2 100%  Physical Exam Vitals and nursing note reviewed.  Constitutional:      Appearance: Normal appearance. She is not diaphoretic.  HENT:     Head: Normocephalic and atraumatic.     Jaw: Tenderness present. No trismus.      Nose: Nose normal.  Eyes:     Conjunctiva/sclera: Conjunctivae normal.     Pupils: Pupils are equal, round, and reactive to light.  Cardiovascular:     Rate and Rhythm: Normal rate and regular rhythm.     Pulses: Normal pulses.     Heart sounds: Normal heart sounds.  Pulmonary:     Effort: Pulmonary effort is normal.     Breath sounds: Normal breath sounds.  Abdominal:     General: Abdomen is flat. Bowel sounds are normal.     Palpations: Abdomen is soft.     Tenderness: There is no abdominal tenderness.  Musculoskeletal:        General: Normal range of motion.     Cervical back: Normal range of motion and neck supple.  Lymphadenopathy:     Cervical: Cervical adenopathy present.  Skin:    General: Skin is warm and dry.     Capillary Refill: Capillary refill takes less than 2 seconds.  Neurological:     General: No focal deficit present.     Mental Status: She is alert and oriented to person, place, and time.     Deep Tendon Reflexes: Reflexes normal.  Psychiatric:        Mood and Affect: Mood normal.        Behavior: Behavior normal.    ED Results / Procedures / Treatments   Labs (all labs ordered are listed, but only abnormal results are  displayed) Results for orders placed or performed during the hospital encounter of 12/17/21  Resp Panel by RT-PCR (Flu A&B, Covid) Nasopharyngeal Swab   Specimen: Nasopharyngeal Swab; Nasopharyngeal(NP) swabs in vial transport medium  Result Value Ref Range   SARS Coronavirus 2 by RT PCR NEGATIVE NEGATIVE   Influenza A by PCR NEGATIVE NEGATIVE   Influenza B by PCR NEGATIVE NEGATIVE  Comprehensive metabolic panel  Result Value Ref Range   Sodium 138 135 - 145 mmol/L   Potassium 3.7 3.5 - 5.1 mmol/L   Chloride 107 98 - 111 mmol/L   CO2 22 22 - 32 mmol/L   Glucose, Bld 118 (H) 70 - 99 mg/dL   BUN 12 6 - 20 mg/dL   Creatinine, Ser 0.91 0.44 - 1.00 mg/dL   Calcium 9.0 8.9 - 10.3 mg/dL   Total Protein 7.2 6.5 - 8.1 g/dL   Albumin 4.0 3.5 - 5.0 g/dL   AST 13 (L) 15 - 41 U/L   ALT 8 0 - 44 U/L   Alkaline Phosphatase 64 38 - 126 U/L   Total Bilirubin 0.2 (L) 0.3 - 1.2 mg/dL   GFR, Estimated >60 >60 mL/min   Anion gap 9 5 - 15  CBC with Differential  Result Value Ref Range   WBC 10.7 (H) 4.0 - 10.5 K/uL   RBC 3.95 3.87 - 5.11 MIL/uL   Hemoglobin 7.8 (L) 12.0 - 15.0 g/dL   HCT 27.8 (L) 36.0 - 46.0 %   MCV 70.4 (L) 80.0 - 100.0 fL   MCH 19.7 (L) 26.0 - 34.0 pg   MCHC 28.1 (L) 30.0 - 36.0 g/dL   RDW 19.5 (H) 11.5 - 15.5 %   Platelets 274 150 - 400 K/uL   nRBC 0.0 0.0 - 0.2 %   Neutrophils Relative % 72 %   Neutro Abs 7.8 (H) 1.7 - 7.7 K/uL   Lymphocytes Relative 18 %   Lymphs Abs 1.9 0.7 - 4.0 K/uL   Monocytes Relative 8 %   Monocytes Absolute 0.8 0.1 - 1.0 K/uL   Eosinophils Relative 1 %   Eosinophils Absolute 0.1 0.0 - 0.5 K/uL   Basophils Relative 1 %   Basophils Absolute 0.1 0.0 - 0.1 K/uL   Immature Granulocytes 0 %   Abs Immature Granulocytes 0.03 0.00 - 0.07 K/uL   CT Maxillofacial W Contrast  Result Date: 12/17/2021 CLINICAL DATA:  Facial abscess EXAM: CT MAXILLOFACIAL WITH CONTRAST TECHNIQUE: Multidetector CT imaging of the maxillofacial structures was performed with  intravenous contrast. Multiplanar CT image reconstructions were also generated. RADIATION DOSE REDUCTION: This exam was performed according to the departmental dose-optimization program which includes automated exposure control,  adjustment of the mA and/or kV according to patient size and/or use of iterative reconstruction technique. CONTRAST:  83mL OMNIPAQUE IOHEXOL 300 MG/ML  SOLN COMPARISON:  None. FINDINGS: Osseous: No facial or mandibular fracture. Orbits: The globes are intact. Normal appearance of the intra- and extraconal fat. Symmetric extraocular muscles. Sinuses: No fluid levels or advanced mucosal thickening. Soft tissues: Left submandibular cutaneous lesion with underlying edema. No fluid collection. Limited intracranial: Normal. Other: None. IMPRESSION: Left submandibular cutaneous lesion with underlying edema, likely cellulitis. No fluid collection. Electronically Signed   By: Ulyses Jarred M.D.   On: 12/17/2021 01:47     Radiology CT Maxillofacial W Contrast  Result Date: 12/17/2021 CLINICAL DATA:  Facial abscess EXAM: CT MAXILLOFACIAL WITH CONTRAST TECHNIQUE: Multidetector CT imaging of the maxillofacial structures was performed with intravenous contrast. Multiplanar CT image reconstructions were also generated. RADIATION DOSE REDUCTION: This exam was performed according to the departmental dose-optimization program which includes automated exposure control, adjustment of the mA and/or kV according to patient size and/or use of iterative reconstruction technique. CONTRAST:  72mL OMNIPAQUE IOHEXOL 300 MG/ML  SOLN COMPARISON:  None. FINDINGS: Osseous: No facial or mandibular fracture. Orbits: The globes are intact. Normal appearance of the intra- and extraconal fat. Symmetric extraocular muscles. Sinuses: No fluid levels or advanced mucosal thickening. Soft tissues: Left submandibular cutaneous lesion with underlying edema. No fluid collection. Limited intracranial: Normal. Other: None.  IMPRESSION: Left submandibular cutaneous lesion with underlying edema, likely cellulitis. No fluid collection. Electronically Signed   By: Ulyses Jarred M.D.   On: 12/17/2021 01:47    Procedures Procedures    Medications Ordered in ED Medications  vancomycin (VANCOCIN) IVPB 1000 mg/200 mL premix (has no administration in time range)  ceFEPIme (MAXIPIME) 2 g in sodium chloride 0.9 % 100 mL IVPB (has no administration in time range)  ibuprofen (ADVIL) tablet 400 mg (400 mg Oral Given 12/16/21 1956)  iohexol (OMNIPAQUE) 300 MG/ML solution 100 mL (75 mLs Intravenous Contrast Given 12/17/21 0107)  ketorolac (TORADOL) injection 30 mg (30 mg Intramuscular Given 12/17/21 0206)    ED Course/ Medical Decision Making/ A&P                           Medical Decision Making Swelling of the face. No f/c/r no vomiting.  No diabetes sent from urgent care   Problems Addressed: Cellulitis of face: acute illness or injury  Amount and/or Complexity of Data Reviewed External Data Reviewed: notes.    Details: urgent care note reviewed and appreciate note but no abscess is present Labs: ordered.    Details: elevated white count normal electrolytes Radiology: ordered.    Details: CT without abscess by me  Risk Prescription drug management. Decision regarding hospitalization. Risk Details: Decision made for IV antibiotics and hospitalization given extension under mandibular.  No abscess to drain at this time.  No signs of elevation of the floor of the mouth.     Final Clinical Impression(s) / ED Diagnoses Final diagnoses:  Cellulitis of face   The patient appears reasonably stabilized for admission considering the current resources, flow, and capabilities available in the ED at this time, and I doubt any other [facility] requiring further screening and/or treatment in the ED prior to admission.    Haylea Schlichting, MD 12/17/21 [phone]

## 2021-12-17 NOTE — Assessment & Plan Note (Signed)
-  Patient without prior h/o abscess presenting with pustule along left chin and surrounding cellulitis -Imaging does not show concern for abscess formation and pustule is already draining -She was given Vanc and cefepime in the ER, will continue for now -Will admit to Med Surg for ongoing abx therapy

## 2021-12-17 NOTE — ED Notes (Signed)
Pt called to room no answer x 1

## 2021-12-17 NOTE — Assessment & Plan Note (Signed)
-  Continue Cozaar, HCTZ -Needs outpatient PCP, TOC consult requested

## 2021-12-17 NOTE — Assessment & Plan Note (Signed)
-  Known uterine fibroids -Hgb 7.8 (stable from 1/20) and she started her menses today -She often feels fatigued with menses -She started taking FeSO4 yesterday, will continue -Needs outpatient GYN f/u

## 2021-12-17 NOTE — ED Notes (Signed)
Ct scan notified RN of pts IV possibly being infiltrated.  Pt returned from ct scan and swelling noted above IV site in left ac.  Pt reports that area is tender to touch.  Per ct scan protocol for contrast infiltration, alternate between heat and ice every 15 mins along with elevating the extremity.  Heat applied to site and LUE elevated.

## 2021-12-17 NOTE — Progress Notes (Signed)
Pharmacy Antibiotic Note  Ashley Orr is a 51 y.o. female admitted on 12/17/2021 with facial cellulitis/abscess.  Pharmacy has been consulted for vancomycin and cefepime dosing. Each antibiotic has a 7d stop date per consult.   1/28: WBC 10.7, afebrile   Plan: Vancomycin 1 g IV x 2 (total 2g- given one dose in ED)  Vancomycin 1250 mg IV Q24H (eAUC 526.1, Scr used 0.91)  Cefepime 2g IV Q8H (7d DOT)  Monitor renal function, de-escalation, clinical improvement, levels as indicated   Weight: 91.6 kg (202 lb)  Temp (24hrs), Avg:98.2 F (36.8 C), Min:97.4 F (36.3 C), Max:99.2 F (37.3 C)  Recent Labs  Lab 12/16/21 2045 12/17/21 0952  WBC 10.7*  --   CREATININE 0.91  --   LATICACIDVEN  --  0.6    CrCl cannot be calculated (Unknown ideal weight.).    Allergies  Allergen Reactions   Codeine Anaphylaxis    Antimicrobials this admission: Cefepime 1/28>> Vancomycin 1/28>>  Microbiology results: 1/28 Bcx: sent   Thank you for allowing pharmacy to be a part of this patients care.  Adria Dill, PharmD PGY-1 Acute Care Resident  12/17/2021 12:27 PM

## 2021-12-17 NOTE — ED Notes (Signed)
[facility] at bedside 

## 2021-12-17 NOTE — ED Notes (Signed)
Handoff report given to [facility] RN on 48M at [facility]

## 2021-12-18 DIAGNOSIS — L03211 Cellulitis of face: Secondary | ICD-10-CM | POA: Diagnosis not present

## 2021-12-18 LAB — BASIC METABOLIC PANEL
Anion gap: 8 (ref 5–15)
BUN: 8 mg/dL (ref 6–20)
CO2: 21 mmol/L — ABNORMAL LOW (ref 22–32)
Calcium: 8.4 mg/dL — ABNORMAL LOW (ref 8.9–10.3)
Chloride: 108 mmol/L (ref 98–111)
Creatinine, Ser: 0.81 mg/dL (ref 0.44–1.00)
GFR, Estimated: >60 mL/min (ref >60)
Glucose, Bld: 87 mg/dL (ref 70–99)
Potassium: 3.6 mmol/L (ref 3.5–5.1)
Sodium: 137 mmol/L (ref 135–145)

## 2021-12-18 LAB — CBC
HCT: 25 % — ABNORMAL LOW (ref 36.0–46.0)
Hemoglobin: 7.2 g/dL — ABNORMAL LOW (ref 12.0–15.0)
MCH: 20.3 pg — ABNORMAL LOW (ref 26.0–34.0)
MCHC: 28.8 g/dL — ABNORMAL LOW (ref 30.0–36.0)
MCV: 70.6 fL — ABNORMAL LOW (ref 80.0–100.0)
Platelets: 225 10*3/uL (ref 150–400)
RBC: 3.54 MIL/uL — ABNORMAL LOW (ref 3.87–5.11)
RDW: 19.3 % — ABNORMAL HIGH (ref 11.5–15.5)
WBC: 6.3 10*3/uL (ref 4.0–10.5)
nRBC: 0 % (ref 0.0–0.2)

## 2021-12-18 MED ORDER — FERROUS SULFATE 325 (65 FE) MG PO TABS
325.0000 mg | ORAL_TABLET | ORAL | Status: DC
Start: 2021-12-18 — End: 2021-12-18

## 2021-12-18 MED ORDER — LIP MEDEX EX OINT
TOPICAL_OINTMENT | CUTANEOUS | Status: DC | PRN
  Filled 2021-12-18: qty 7

## 2021-12-18 NOTE — Progress Notes (Signed)

## 2021-12-18 NOTE — Progress Notes (Signed)
PROGRESS NOTE    Ashley Orr  WUJ:[id number] DOB: 10-21-1971 DOA: 12/17/2021 PCP: Merryl Hacker, No  Outpatient Specialists: none    Brief Narrative:   Ashley Orr is a 51 y.o. female with medical history significant of HTN and hypothyroidism presenting with jaw abscess.  She reports that she was living in [place] and recently moved to [place]; she has not established care here.  She reports that she has several bad teeth and developed a left lower posterior tooth abscess recently; it resolved and now she has a skin tag there.  Meanwhile, she noticed a pimple on her L mandibular region on Monday and it got worse.  It came to a head and she popped it with some relief, but then it started getting swollen, firm, and red in the region with pain.  The redness and swelling extended onto her neck and she decided to go to urgent care and then was sent to the ER.  No fevers.   Assessment & Plan:   Principal Problem:   Cellulitis of face Active Problems:   Essential hypertension   Hypothyroidism (acquired)   Iron deficiency anemia due to chronic blood loss  # Cellulitis/abscess of chin Appears to be superficial. Imaging and symptoms not suggestive of deep space infection. Today spontaneously draining, I expressed a moderate amount of pus at bedside. Has responded to iv vancomycin - f/u culture, suspect mrsa - cont vanc - offered d/c today but patient wants 1 more day to monitor for continued improvement which is reasonable - ent consulted for I & D, didn't think that necessary, will f/u tomorrow  # Iron deficiency anemia # Heavy menstrual bleeding Patient reports hx of HMB 2/2 known fibroids. Is not currently established w/ ob/gyn - start oral iron - referral to gyn @ d/c  # Hypothyroidism - cont synthroid  # HTN - cont home meds.         DVT prophylaxis: lovenox Code Status: full Family Communication: none @ bedside  Level of care: Med-Surg Status is:  Inpatient  Remains inpatient appropriate because: iv abx        Consultants:  ENT  Procedures: none  Antimicrobials:  vancomycin    Subjective: Pain and swelling improved  Objective: Vitals:   12/17/21 1710 12/17/21 2047 12/18/21 0620 12/18/21 1014  BP: 138/82 121/79 134/70 121/73  Pulse: 81 76 79 74  Resp: 12 16 14 16   Temp: 98.5 F (36.9 C) 98.3 F (36.8 C) 98.6 F (37 C) 98.1 F (36.7 C)  TempSrc: Oral Oral  Oral  SpO2: 99% 100% 99% 100%  Weight:      Height:        Intake/Output Summary (Last 24 hours) at 12/18/2021 1408 Last data filed at 12/17/2021 2110 Gross per 24 hour  Intake 340 ml  Output 0 ml  Net 340 ml   Filed Weights   12/16/21 1947 12/17/21 1700  Weight: 91.6 kg 91.6 kg    Examination:  General exam: Appears calm and comfortable  Respiratory system: Clear to auscultation. Respiratory effort normal. Cardiovascular system: S1 & S2 heard, RRR. No JVD, murmurs, rubs, gallops or clicks. No pedal edema. Gastrointestinal system: Abdomen is nondistended, soft and nontender. No organomegaly or masses felt. Normal bowel sounds heard. Central nervous system: Alert and oriented. No focal neurological deficits. Extremities: Symmetric 5 x 5 power. Skin: left chin erythema and induration draining pus Psychiatry: Judgement and insight appear normal. Mood & affect appropriate.     Data Reviewed: I have  personally reviewed following labs and imaging studies  CBC: Recent Labs  Lab 12/16/21 2045 12/18/21 0231  WBC 10.7* 6.3  NEUTROABS 7.8*  --   HGB 7.8* 7.2*  HCT 27.8* 25.0*  MCV 70.4* 70.6*  PLT 274 659   Basic Metabolic Panel: Recent Labs  Lab 12/16/21 2045 12/18/21 0231  NA 138 137  K 3.7 3.6  CL 107 108  CO2 22 21*  GLUCOSE 118* 87  BUN 12 8  CREATININE 0.91 0.81  CALCIUM 9.0 8.4*   GFR: Estimated Creatinine Clearance: 85.5 mL/min (by C-G formula based on SCr of 0.81 mg/dL). Liver Function Tests: Recent Labs  Lab  12/16/21 2045  AST 13*  ALT 8  ALKPHOS 64  BILITOT 0.2*  PROT 7.2  ALBUMIN 4.0   No results for input(s): LIPASE, AMYLASE in the last 168 hours. No results for input(s): AMMONIA in the last 168 hours. Coagulation Profile: No results for input(s): INR, PROTIME in the last 168 hours. Cardiac Enzymes: No results for input(s): CKTOTAL, CKMB, CKMBINDEX, TROPONINI in the last 168 hours. BNP (last 3 results) No results for input(s): PROBNP in the last 8760 hours. HbA1C: No results for input(s): HGBA1C in the last 72 hours. CBG: No results for input(s): GLUCAP in the last 168 hours. Lipid Profile: No results for input(s): CHOL, HDL, LDLCALC, TRIG, CHOLHDL, LDLDIRECT in the last 72 hours. Thyroid Function Tests: No results for input(s): TSH, T4TOTAL, FREET4, T3FREE, THYROIDAB in the last 72 hours. Anemia Panel: No results for input(s): VITAMINB12, FOLATE, FERRITIN, TIBC, IRON, RETICCTPCT in the last 72 hours. Urine analysis: No results found for: COLORURINE, APPEARANCEUR, LABSPEC, PHURINE, GLUCOSEU, HGBUR, BILIRUBINUR, KETONESUR, PROTEINUR, UROBILINOGEN, NITRITE, LEUKOCYTESUR Sepsis Labs: @LABRCNTIP (procalcitonin:4,lacticidven:4)  ) Recent Results (from the past 240 hour(s))  Resp Panel by RT-PCR (Flu A&B, Covid) Nasopharyngeal Swab     Status: None   Collection Time: 12/16/21 11:47 PM   Specimen: Nasopharyngeal Swab; Nasopharyngeal(NP) swabs in vial transport medium  Result Value Ref Range Status   SARS Coronavirus 2 by RT PCR NEGATIVE NEGATIVE Final    Comment: (NOTE) SARS-CoV-2 target nucleic acids are NOT DETECTED.  The SARS-CoV-2 RNA is generally detectable in upper respiratory specimens during the acute phase of infection. The lowest concentration of SARS-CoV-2 viral copies this assay can detect is 138 copies/mL. A negative result does not preclude SARS-Cov-2 infection and should not be used as the sole basis for treatment or other patient management decisions. A negative  result may occur with  improper specimen collection/handling, submission of specimen other than nasopharyngeal swab, presence of viral mutation(s) within the areas targeted by this assay, and inadequate number of viral copies(<138 copies/mL). A negative result must be combined with clinical observations, patient history, and epidemiological information. The expected result is Negative.  Fact Sheet for Patients:  EntrepreneurPulse.com.au  Fact Sheet for Healthcare Providers:  IncredibleEmployment.be  This test is no t yet approved or cleared by the [place] FDA and  has been authorized for detection and/or diagnosis of SARS-CoV-2 by FDA under an Emergency Use Authorization (EUA). This EUA will remain  in effect (meaning this test can be used) for the duration of the COVID-19 declaration under Section 564(b)(1) of the Act, 21 U.S.C.section 360bbb-3(b)(1), unless the authorization is terminated  or revoked sooner.       Influenza A by PCR NEGATIVE NEGATIVE Final   Influenza B by PCR NEGATIVE NEGATIVE Final    Comment: (NOTE) The Xpert Xpress SARS-CoV-2/FLU/RSV plus assay is intended as an aid in  the diagnosis of influenza from Nasopharyngeal swab specimens and should not be used as a sole basis for treatment. Nasal washings and aspirates are unacceptable for Xpert Xpress SARS-CoV-2/FLU/RSV testing.  Fact Sheet for Patients: EntrepreneurPulse.com.au  Fact Sheet for Healthcare Providers: IncredibleEmployment.be  This test is not yet approved or cleared by the [place] FDA and has been authorized for detection and/or diagnosis of SARS-CoV-2 by FDA under an Emergency Use Authorization (EUA). This EUA will remain in effect (meaning this test can be used) for the duration of the COVID-19 declaration under Section 564(b)(1) of the Act, 21 U.S.C. section 360bbb-3(b)(1), unless the authorization is  terminated or revoked.  Performed at [facility], [address], [place], [place] [address]   Culture, blood (routine x 2)     Status: None (Preliminary result)   Collection Time: 12/17/21  1:44 PM   Specimen: BLOOD LEFT ARM  Result Value Ref Range Status   Specimen Description BLOOD LEFT ARM  Final   Special Requests   Final    BOTTLES DRAWN AEROBIC AND ANAEROBIC Blood Culture adequate volume   Culture   Final    NO GROWTH < 24 HOURS Performed at [facility], [place] [address], [place], [place] [address]    Report Status PENDING  Incomplete  Culture, blood (routine x 2)     Status: None (Preliminary result)   Collection Time: 12/17/21  1:46 PM   Specimen: BLOOD  Result Value Ref Range Status   Specimen Description BLOOD LEFT ANTECUBITAL  Final   Special Requests   Final    BOTTLES DRAWN AEROBIC AND ANAEROBIC Blood Culture adequate volume   Culture   Final    NO GROWTH < 24 HOURS Performed at [facility], [place] [address], [place], [place] [address]    Report Status PENDING  Incomplete         Radiology Studies: CT Maxillofacial W Contrast  Result Date: 12/17/2021 CLINICAL DATA:  Facial abscess EXAM: CT MAXILLOFACIAL WITH CONTRAST TECHNIQUE: Multidetector CT imaging of the maxillofacial structures was performed with intravenous contrast. Multiplanar CT image reconstructions were also generated. RADIATION DOSE REDUCTION: This exam was performed according to the departmental dose-optimization program which includes automated exposure control, adjustment of the mA and/or kV according to patient size and/or use of iterative reconstruction technique. CONTRAST:  23mL OMNIPAQUE IOHEXOL 300 MG/ML  SOLN COMPARISON:  None. FINDINGS: Osseous: No facial or mandibular fracture. Orbits: The globes are intact. Normal appearance of the intra- and extraconal fat. Symmetric extraocular muscles. Sinuses: No fluid levels or advanced mucosal thickening. Soft tissues:  Left submandibular cutaneous lesion with underlying edema. No fluid collection. Limited intracranial: Normal. Other: None. IMPRESSION: Left submandibular cutaneous lesion with underlying edema, likely cellulitis. No fluid collection. Electronically Signed   By: Ulyses Jarred M.D.   On: 12/17/2021 01:47        Scheduled Meds:  Chlorhexidine Gluconate Cloth  6 each Topical Daily   citalopram  40 mg Oral Daily   docusate sodium  100 mg Oral BID   enoxaparin (LOVENOX) injection  40 mg Subcutaneous Q24H   ferrous sulfate  325 mg Oral Q breakfast   hydrochlorothiazide  25 mg Oral Daily   levothyroxine  175 mcg Oral QODAY   levothyroxine  200 mcg Oral QODAY   losartan  50 mg Oral Daily   oxybutynin  5 mg Oral QPM   sodium chloride flush  10-40 mL Intracatheter Q12H   traZODone  100 mg Oral  QHS   venlafaxine XR  37.5 mg Oral Q breakfast   Continuous Infusions:  vancomycin 1,250 mg (12/18/21 0932)     LOS: 1 day    Time spent: 49 min    Desma Maxim, MD [facility]   If 7PM-7AM, please contact night-coverage [email or web address] Password [facility] 12/18/2021, 2:08 PM

## 2021-12-18 NOTE — Consult Note (Signed)
Reason for Consult: Facial infeciton Referring Physician: Hospitalist  Ashley Orr is an 51 y.o. female.  HPI: 51 year old female developed a pustule on the left chin on Monday that she was able to unroof.  There has been some drainage but the surrounding area of swelling and pain worsened.  She was admitted to the hospital yesterday and treated with vancomycin and Rocephin.  A culture was sent this morning and Rocephin was stopped.  She had an infected left lower posterior tooth abscess recently that resolved.  She has not had fever.  Past Medical History:  Diagnosis Date   Hypertension    Hypothyroidism (acquired) 12/17/2021   Thyroid disease     History reviewed. No pertinent surgical history.  History reviewed. No pertinent family history.  Social History:  reports that she has been smoking cigarettes. She has never used smokeless tobacco. No history on file for alcohol use and drug use.  Allergies:  Allergies  Allergen Reactions   Codeine Anaphylaxis    Medications: I have reviewed the patient's current medications.  Results for orders placed or performed during the hospital encounter of 12/17/21 (from the past 48 hour(s))  Comprehensive metabolic panel     Status: Abnormal   Collection Time: 12/16/21  8:45 PM  Result Value Ref Range   Sodium 138 135 - 145 mmol/L   Potassium 3.7 3.5 - 5.1 mmol/L   Chloride 107 98 - 111 mmol/L   CO2 22 22 - 32 mmol/L   Glucose, Bld 118 (H) 70 - 99 mg/dL    Comment: Glucose reference range applies only to samples taken after fasting for at least 8 hours.   BUN 12 6 - 20 mg/dL   Creatinine, Ser 0.91 0.44 - 1.00 mg/dL   Calcium 9.0 8.9 - 10.3 mg/dL   Total Protein 7.2 6.5 - 8.1 g/dL   Albumin 4.0 3.5 - 5.0 g/dL   AST 13 (L) 15 - 41 U/L   ALT 8 0 - 44 U/L   Alkaline Phosphatase 64 38 - 126 U/L   Total Bilirubin 0.2 (L) 0.3 - 1.2 mg/dL   GFR, Estimated >60 >60 mL/min    Comment: (NOTE) Calculated using the CKD-EPI Creatinine  Equation (2021)    Anion gap 9 5 - 15    Comment: Performed at [facility], [place], [place] [address]  CBC with Differential     Status: Abnormal   Collection Time: 12/16/21  8:45 PM  Result Value Ref Range   WBC 10.7 (H) 4.0 - 10.5 K/uL   RBC 3.95 3.87 - 5.11 MIL/uL   Hemoglobin 7.8 (L) 12.0 - 15.0 g/dL    Comment: Reticulocyte Hemoglobin testing may be clinically indicated, consider ordering this additional test [id number]    HCT 27.8 (L) 36.0 - 46.0 %   MCV 70.4 (L) 80.0 - 100.0 fL   MCH 19.7 (L) 26.0 - 34.0 pg   MCHC 28.1 (L) 30.0 - 36.0 g/dL   RDW 19.5 (H) 11.5 - 15.5 %   Platelets 274 150 - 400 K/uL   nRBC 0.0 0.0 - 0.2 %   Neutrophils Relative % 72 %   Neutro Abs 7.8 (H) 1.7 - 7.7 K/uL   Lymphocytes Relative 18 %   Lymphs Abs 1.9 0.7 - 4.0 K/uL   Monocytes Relative 8 %   Monocytes Absolute 0.8 0.1 - 1.0 K/uL   Eosinophils Relative 1 %   Eosinophils Absolute 0.1 0.0 - 0.5 K/uL  Basophils Relative 1 %   Basophils Absolute 0.1 0.0 - 0.1 K/uL   Immature Granulocytes 0 %   Abs Immature Granulocytes 0.03 0.00 - 0.07 K/uL    Comment: Performed at [facility], [address], [place], [place] [address]  Resp Panel by RT-PCR (Flu A&B, Covid) Nasopharyngeal Swab     Status: None   Collection Time: 12/16/21 11:47 PM   Specimen: Nasopharyngeal Swab; Nasopharyngeal(NP) swabs in vial transport medium  Result Value Ref Range   SARS Coronavirus 2 by RT PCR NEGATIVE NEGATIVE    Comment: (NOTE) SARS-CoV-2 target nucleic acids are NOT DETECTED.  The SARS-CoV-2 RNA is generally detectable in upper respiratory specimens during the acute phase of infection. The lowest concentration of SARS-CoV-2 viral copies this assay can detect is 138 copies/mL. A negative result does not preclude SARS-Cov-2 infection and should not be used as the sole basis for treatment or other patient management decisions. A negative result may occur  with  improper specimen collection/handling, submission of specimen other than nasopharyngeal swab, presence of viral mutation(s) within the areas targeted by this assay, and inadequate number of viral copies(<138 copies/mL). A negative result must be combined with clinical observations, patient history, and epidemiological information. The expected result is Negative.  Fact Sheet for Patients:  EntrepreneurPulse.com.au  Fact Sheet for Healthcare Providers:  IncredibleEmployment.be  This test is no t yet approved or cleared by the [place] FDA and  has been authorized for detection and/or diagnosis of SARS-CoV-2 by FDA under an Emergency Use Authorization (EUA). This EUA will remain  in effect (meaning this test can be used) for the duration of the COVID-19 declaration under Section 564(b)(1) of the Act, 21 U.S.C.section 360bbb-3(b)(1), unless the authorization is terminated  or revoked sooner.       Influenza A by PCR NEGATIVE NEGATIVE   Influenza B by PCR NEGATIVE NEGATIVE    Comment: (NOTE) The Xpert Xpress SARS-CoV-2/FLU/RSV plus assay is intended as an aid in the diagnosis of influenza from Nasopharyngeal swab specimens and should not be used as a sole basis for treatment. Nasal washings and aspirates are unacceptable for Xpert Xpress SARS-CoV-2/FLU/RSV testing.  Fact Sheet for Patients: EntrepreneurPulse.com.au  Fact Sheet for Healthcare Providers: IncredibleEmployment.be  This test is not yet approved or cleared by the [place] FDA and has been authorized for detection and/or diagnosis of SARS-CoV-2 by FDA under an Emergency Use Authorization (EUA). This EUA will remain in effect (meaning this test can be used) for the duration of the COVID-19 declaration under Section 564(b)(1) of the Act, 21 U.S.C. section 360bbb-3(b)(1), unless the authorization is terminated or revoked.  Performed  at [facility], [address] [place], [place] [address]   Lactic acid, plasma     Status: None   Collection Time: 12/17/21  9:52 AM  Result Value Ref Range   Lactic Acid, Venous 0.6 0.5 - 1.9 mmol/L    Comment: Performed at [facility], [address], [place], [place] [address]  Culture, blood (routine x 2)     Status: None (Preliminary result)   Collection Time: 12/17/21  1:44 PM   Specimen: BLOOD LEFT ARM  Result Value Ref Range   Specimen Description BLOOD LEFT ARM    Special Requests      BOTTLES DRAWN AEROBIC AND ANAEROBIC Blood Culture adequate volume   Culture      NO GROWTH < 24 HOURS Performed at [facility], [place] [address] [employer] [place], [place]  [address]    Report Status PENDING   Culture, blood (routine x 2)     Status: None (Preliminary result)   Collection Time: 12/17/21  1:46 PM   Specimen: BLOOD  Result Value Ref Range   Specimen Description BLOOD LEFT ANTECUBITAL    Special Requests      BOTTLES DRAWN AEROBIC AND ANAEROBIC Blood Culture adequate volume   Culture      NO GROWTH < 24 HOURS Performed at [facility], [place] [address], [place], [place] [address]    Report Status PENDING   HIV Antibody (routine testing w rflx)     Status: None   Collection Time: 12/17/21  1:47 PM  Result Value Ref Range   HIV Screen 4th Generation wRfx Non Reactive Non Reactive    Comment: Performed at [facility], [place] [address], [place], [place] [address]  Basic metabolic panel     Status: Abnormal   Collection Time: 12/18/21  2:31 AM  Result Value Ref Range   Sodium 137 135 - 145 mmol/L   Potassium 3.6 3.5 - 5.1 mmol/L   Chloride 108 98 - 111 mmol/L   CO2 21 (L) 22 - 32 mmol/L   Glucose, Bld 87 70 - 99 mg/dL    Comment: Glucose reference range applies only to samples taken after fasting for at least 8 hours.   BUN 8 6 - 20 mg/dL   Creatinine, Ser 0.81 0.44 - 1.00 mg/dL   Calcium 8.4 (L) 8.9 - 10.3 mg/dL   GFR, Estimated >60  >60 mL/min    Comment: (NOTE) Calculated using the CKD-EPI Creatinine Equation (2021)    Anion gap 8 5 - 15    Comment: Performed at [facility] [address] [place], [place] [address]  CBC     Status: Abnormal   Collection Time: 12/18/21  2:31 AM  Result Value Ref Range   WBC 6.3 4.0 - 10.5 K/uL   RBC 3.54 (L) 3.87 - 5.11 MIL/uL   Hemoglobin 7.2 (L) 12.0 - 15.0 g/dL    Comment: Reticulocyte Hemoglobin testing may be clinically indicated, consider ordering this additional test [id number]    HCT 25.0 (L) 36.0 - 46.0 %   MCV 70.6 (L) 80.0 - 100.0 fL   MCH 20.3 (L) 26.0 - 34.0 pg   MCHC 28.8 (L) 30.0 - 36.0 g/dL   RDW 19.3 (H) 11.5 - 15.5 %   Platelets 225 150 - 400 K/uL    Comment: REPEATED TO VERIFY   nRBC 0.0 0.0 - 0.2 %    Comment: Performed at [facility], [place] [address] [place], [place] [address]    CT Maxillofacial W Contrast  Result Date: 12/17/2021 CLINICAL DATA:  Facial abscess EXAM: CT MAXILLOFACIAL WITH CONTRAST TECHNIQUE: Multidetector CT imaging of the maxillofacial structures was performed with intravenous contrast. Multiplanar CT image reconstructions were also generated. RADIATION DOSE REDUCTION: This exam was performed according to the departmental dose-optimization program which includes automated exposure control, adjustment of the mA and/or kV according to patient size and/or use of iterative reconstruction technique. CONTRAST:  32mL OMNIPAQUE IOHEXOL 300 MG/ML  SOLN COMPARISON:  None. FINDINGS: Osseous: No facial or mandibular fracture. Orbits: The globes are intact. Normal appearance of the intra- and extraconal fat. Symmetric extraocular muscles. Sinuses: No fluid levels or advanced mucosal thickening. Soft tissues: Left submandibular cutaneous lesion with underlying edema. No fluid collection. Limited intracranial: Normal. Other: None. IMPRESSION: Left submandibular cutaneous lesion with underlying  edema, likely cellulitis. No fluid collection.  Electronically Signed   By: Ulyses Jarred M.D.   On: 12/17/2021 01:47    Review of Systems  HENT:  Positive for facial swelling.   All other systems reviewed and are negative. Blood pressure 121/73, pulse 74, temperature 98.1 F (36.7 C), temperature source Oral, resp. rate 16, height 5\' 2"  (1.575 m), weight 91.6 kg, last menstrual period 09/24/2021, SpO2 100 %. Physical Exam Constitutional:      Appearance: Normal appearance. She is normal weight.  HENT:     Head: Normocephalic and atraumatic.     Right Ear: External ear normal.     Left Ear: External ear normal.     Nose: Nose normal.     Mouth/Throat:     Mouth: Mucous membranes are moist.     Pharynx: Oropharynx is clear.  Eyes:     Extraocular Movements: Extraocular movements intact.     Conjunctiva/sclera: Conjunctivae normal.     Pupils: Pupils are equal, round, and reactive to light.  Neck:     Comments: Left chin with 3 cm area of induration and tenderness with central purulent drainage, nothing expressible. Cardiovascular:     Rate and Rhythm: Normal rate.  Pulmonary:     Effort: Pulmonary effort is normal.  Skin:    General: Skin is warm and dry.  Neurological:     General: No focal deficit present.     Mental Status: She is alert and oriented to person, place, and time.  Psychiatric:        Mood and Affect: Mood normal.        Behavior: Behavior normal.        Thought Content: Thought content normal.        Judgment: Judgment normal.    Assessment/Plan: Left chin cellulitis  I personally reviewed her CT scan not demonstrating a fluid collection.  Her exam is consistent with primarily cellulitis with the appearance of a MRSA infection.  WBC normal.  No fever.  No surgery needed at this time.  Continue IV antibiotics until improvement is evident and then OK to discharge on oral antibiotics.  There is a slight lucency around her lower incisors on the CT scan.  I recommended she seek dentistry evaluation as  outpatient.  Melida Quitter 12/18/2021, 11:37 AM

## 2021-12-19 DIAGNOSIS — L03211 Cellulitis of face: Secondary | ICD-10-CM | POA: Diagnosis not present

## 2021-12-19 MED ORDER — LINEZOLID 600 MG PO TABS: 600.0000 mg | ORAL_TABLET | Freq: Two times a day (BID) | ORAL | Status: DC

## 2021-12-19 MED ORDER — LINEZOLID 600 MG PO TABS: 600.0000 mg | ORAL_TABLET | Freq: Two times a day (BID) | ORAL | 0 refills | Status: AC

## 2021-12-19 MED ORDER — HYDROCODONE-ACETAMINOPHEN 5-325 MG PO TABS: 1.0000 | ORAL_TABLET | Freq: Four times a day (QID) | ORAL | 0 refills | Status: AC | PRN

## 2021-12-19 NOTE — Discharge Summary (Signed)
Physician Discharge Summary  Ashley Orr OAC:[id number] DOB: 04-27-1971 DOA: 12/17/2021  PCP: Pcp, No  Admit date: 12/17/2021 Discharge date: 12/19/2021  Admitted From: Home Discharge disposition: Home   Code Status: Full Code  Diet Recommendation: Cardiac diet  Discharge Diagnosis:   Principal Problem:   Cellulitis of face Active Problems:   Essential hypertension   Hypothyroidism (acquired)   Iron deficiency anemia due to chronic blood loss   History of Present Illness / Brief narrative:  Ashley Orr is a 51 y.o. female with medical history significant of HTN and hypothyroidism. Patient presented to the ED with complaint of a growing boil with purulent discharge. Patient reports she developed a pustule in the left chin on Monday a week ago that she was able to unroof.  She had some drainage but the surrounding area of swelling and pain worsened and hence she presented to the ED. Of note, patient also reports several bad teeth and developed a left lower posterior tooth abscess recently.  She has not seen a [employer]er, [employer] yet.   In the ED, CT scan maxillofacial was obtained which showed a left submandibular cutaneous lesion with underlying edema likely cellulitis and no fluid collection. Admitted to hospitalist service. ENT consultation was obtained   Subjective:  Seen and examined this morning.  Pleasant middle-aged African-American female.  Lying on bed.  Not in distress.  She states the cellulitis is improving.  No more purulent discharge.  Hospital Course:  Left chin cellulitis -CT imaging as above. -Clinically considered for MRSA infection.  No need of surgical intervention.  She was treated with IV antibiotics.  Switch to oral Zyvox today.  4 more days of Zyvox at home to complete 1 week of antibiotics. -Follow-up with ENT as an outpatient if needed. -Limited course of Norco for pain control.  Poor dental hygiene -CT maxillofacial showed  lucency around her lower incisors.  Patient also reports recent tooth abscess. -Need to see dentistry as an outpatient  Chronic iron deficiency anemia Chronic menstrual bleeding Uterine fibroids -History of fibroids with chronic menstrual bleed.  Hemoglobin at baseline between 7 and 8.  Patient reports she has never required blood transfusion. -Patient is in the process to see OB/GYN as an outpatient. -Continue oral iron supplement.  Recent Labs    12/09/21 1043 12/16/21 2045 12/18/21 0231  HGB 7.6* 7.8* 7.2*  MCV 72* 70.4* 70.6*   Hypothyroidism -Continue Synthroid  Hypertension -Home meds include HCTZ 25 mg daily, losartan 50 mg daily -Continue same.  Anxiety/depression Continue Effexor, trazodone, Celexa     Discharge Medications:   Allergies as of 12/19/2021       Reactions   Codeine Anaphylaxis        Medication List     TAKE these medications    Advil Dual Action 125-250 MG Tabs Generic drug: Ibuprofen-Acetaminophen Take 2 tablets by mouth every 6 (six) hours as needed (pain).   Cholecalciferol 50 MCG (2000 UT) Caps Take 2,000 Units by mouth daily.   citalopram 40 MG tablet Commonly known as: CELEXA Take 40 mg by mouth daily.   cyclobenzaprine 10 MG tablet Commonly known as: FLEXERIL Take 10 mg by mouth 2 (two) times daily as needed for muscle spasms.   ferrous sulfate 325 (65 FE) MG tablet Take 325 mg by mouth daily with breakfast.   hydrochlorothiazide 25 MG tablet Commonly known as: HYDRODIURIL Take 25 mg by mouth daily.   HYDROcodone-acetaminophen 5-325 MG tablet Commonly known as: NORCO/VICODIN Take 1  tablet by mouth every 6 (six) hours as needed for up to 3 days for moderate pain.   levothyroxine 200 MCG tablet Commonly known as: SYNTHROID Take 200 mcg by mouth See admin instructions. Alternating with 175 mcg every other day.   levothyroxine 175 MCG tablet Commonly known as: SYNTHROID Take 175 mcg by mouth See admin instructions.  Alternating with 200 mcg every other day   linezolid 600 MG tablet Commonly known as: ZYVOX Take 1 tablet (600 mg total) by mouth every 12 (twelve) hours for 4 days. Start taking on: December 20, 2021   losartan 50 MG tablet Commonly known as: COZAAR Take 50 mg by mouth daily.   oxybutynin 5 MG 24 hr tablet Commonly known as: DITROPAN-XL Take 5 mg by mouth every evening.   traZODone 100 MG tablet Commonly known as: DESYREL Take 100 mg by mouth at bedtime.   venlafaxine XR 37.5 MG 24 hr capsule Commonly known as: EFFEXOR-XR Take 37.5 mg by mouth daily with breakfast.   vitamin C 500 MG tablet Commonly known as: ASCORBIC ACID Take 500 mg by mouth daily.        Wound care:     Discharge Instructions:   Discharge Instructions     Call MD for:  difficulty breathing, headache or visual disturbances   Complete by: As directed    Call MD for:  extreme fatigue   Complete by: As directed    Call MD for:  hives   Complete by: As directed    Call MD for:  persistant dizziness or light-headedness   Complete by: As directed    Call MD for:  persistant nausea and vomiting   Complete by: As directed    Call MD for:  severe uncontrolled pain   Complete by: As directed    Call MD for:  temperature >100.4   Complete by: As directed    Diet - low sodium heart healthy   Complete by: As directed    Discharge instructions   Complete by: As directed    General discharge instructions:  Follow with Primary MD Pcp, No in 7 days   Get CBC/BMP checked in next visit within 1 week by PCP or SNF MD. (We routinely change or add medications that can affect your baseline labs and fluid status, therefore we recommend that you get the mentioned basic workup next visit with your PCP, your PCP may decide not to get them or add new tests based on their clinical decision)  On your next visit with your PCP, please get your medicines reviewed and adjusted.  Please request your PCP  to go over  all hospital tests, procedures, radiology results at the follow up, please get all Hospital records sent to your PCP by signing hospital release before you go home.  Activity: As tolerated with Full fall precautions use walker/cane & assistance as needed  Avoid using any recreational substances like cigarette, tobacco, alcohol, or non-prescribed drug.  If you experience worsening of your admission symptoms, develop shortness of breath, life threatening emergency, suicidal or homicidal thoughts you must seek medical attention immediately by calling 911 or calling your MD immediately  if symptoms less severe.  You must read complete instructions/literature along with all the possible adverse reactions/side effects for all the medicines you take and that have been prescribed to you. Take any new medicine only after you have completely understood and accepted all the possible adverse reactions/side effects.   Do not drive, operate heavy machinery, perform  activities at heights, swimming or participation in water activities or provide baby sitting services if your were admitted for syncope or siezures until you have seen by Primary MD or a Neurologist and advised to do so again.  Do not drive when taking Pain medications.  Do not take more than prescribed Pain, Sleep and Anxiety Medications  Wear Seat belts while driving.  Please note You were cared for by a hospitalist during your hospital stay. If you have any questions about your discharge medications or the care you received while you were in the hospital after you are discharged, you can call the unit and asked to speak with the hospitalist on call if the hospitalist that took care of you is not available. Once you are discharged, your primary care physician will handle any further medical issues. Please note that NO REFILLS for any discharge medications will be authorized once you are discharged, as it is imperative that you return to your primary  care physician (or establish a relationship with a primary care physician if you do not have one) for your aftercare needs so that they can reassess your need for medications and monitor your lab values.   Increase activity slowly   Complete by: As directed        Follow ups:    Follow-up [facility] Follow up.   Contact information: [address] [place] [address] [phone]                Discharge Exam:   Vitals:   12/18/21 1714 12/18/21 1959 12/19/21 0651 12/19/21 0931  BP: (!) 148/76 133/88 136/79 140/81  Pulse: 78 80 79 91  Resp:  16 16 18   Temp: 98.1 F (36.7 C) 98.2 F (36.8 C) 97.9 F (36.6 C) 98.5 F (36.9 C)  TempSrc: Oral Oral Axillary Oral  SpO2: 100% 100% 100% 100%  Weight:      Height:        Body mass index is 36.95 kg/m.  General exam: Pleasant, middle-aged African-American female.  Not in distress Skin: No rashes, lesions or ulcers. HEENT: Atraumatic, normocephalic, no obvious bleeding.  Area selective stenting in the chin.  Local dressing present Lungs: Clear to auscultation bilaterally CVS: Regular rate and rhythm, no murmur GI/Abd soft, nontender, nondistended, bowel sound present CNS: Alert, awake abound x3 Psychiatry: Mood appropriate Extremities: No pedal edema, no calf tenderness  Time coordinating discharge: 35 minutes   The results of significant diagnostics from this hospitalization (including imaging, microbiology, ancillary and laboratory) are listed below for reference.    Procedures and Diagnostic Studies:   CT Maxillofacial W Contrast  Result Date: 12/17/2021 CLINICAL DATA:  Facial abscess EXAM: CT MAXILLOFACIAL WITH CONTRAST TECHNIQUE: Multidetector CT imaging of the maxillofacial structures was performed with intravenous contrast. Multiplanar CT image reconstructions were also generated. RADIATION DOSE REDUCTION: This exam was performed according  to the departmental dose-optimization program which includes automated exposure control, adjustment of the mA and/or kV according to patient size and/or use of iterative reconstruction technique. CONTRAST:  66mL OMNIPAQUE IOHEXOL 300 MG/ML  SOLN COMPARISON:  None. FINDINGS: Osseous: No facial or mandibular fracture. Orbits: The globes are intact. Normal appearance of the intra- and extraconal fat. Symmetric extraocular muscles. Sinuses: No fluid levels or advanced mucosal thickening. Soft tissues: Left submandibular cutaneous lesion with underlying edema. No fluid collection. Limited intracranial: Normal. Other: None. IMPRESSION: Left submandibular cutaneous lesion with underlying edema, likely cellulitis.  No fluid collection. Electronically Signed   By: Ulyses Jarred M.D.   On: 12/17/2021 01:47     Labs:   Basic Metabolic Panel: Recent Labs  Lab 12/16/21 2045 12/18/21 0231  NA 138 137  K 3.7 3.6  CL 107 108  CO2 22 21*  GLUCOSE 118* 87  BUN 12 8  CREATININE 0.91 0.81  CALCIUM 9.0 8.4*   GFR Estimated Creatinine Clearance: 85.5 mL/min (by C-G formula based on SCr of 0.81 mg/dL). Liver Function Tests: Recent Labs  Lab 12/16/21 2045  AST 13*  ALT 8  ALKPHOS 64  BILITOT 0.2*  PROT 7.2  ALBUMIN 4.0   No results for input(s): LIPASE, AMYLASE in the last 168 hours. No results for input(s): AMMONIA in the last 168 hours. Coagulation profile No results for input(s): INR, PROTIME in the last 168 hours.  CBC: Recent Labs  Lab 12/16/21 2045 12/18/21 0231  WBC 10.7* 6.3  NEUTROABS 7.8*  --   HGB 7.8* 7.2*  HCT 27.8* 25.0*  MCV 70.4* 70.6*  PLT 274 225   Cardiac Enzymes: No results for input(s): CKTOTAL, CKMB, CKMBINDEX, TROPONINI in the last 168 hours. BNP: Invalid input(s): POCBNP CBG: No results for input(s): GLUCAP in the last 168 hours. D-Dimer No results for input(s): DDIMER in the last 72 hours. Hgb A1c No results for input(s): HGBA1C in the last 72 hours. Lipid  Profile No results for input(s): CHOL, HDL, LDLCALC, TRIG, CHOLHDL, LDLDIRECT in the last 72 hours. Thyroid function studies No results for input(s): TSH, T4TOTAL, T3FREE, THYROIDAB in the last 72 hours.  Invalid input(s): FREET3 Anemia work up No results for input(s): VITAMINB12, FOLATE, FERRITIN, TIBC, IRON, RETICCTPCT in the last 72 hours. Microbiology Recent Results (from the past 240 hour(s))  Resp Panel by RT-PCR (Flu A&B, Covid) Nasopharyngeal Swab     Status: None   Collection Time: 12/16/21 11:47 PM   Specimen: Nasopharyngeal Swab; Nasopharyngeal(NP) swabs in vial transport medium  Result Value Ref Range Status   SARS Coronavirus 2 by RT PCR NEGATIVE NEGATIVE Final    Comment: (NOTE) SARS-CoV-2 target nucleic acids are NOT DETECTED.  The SARS-CoV-2 RNA is generally detectable in upper respiratory specimens during the acute phase of infection. The lowest concentration of SARS-CoV-2 viral copies this assay can detect is 138 copies/mL. A negative result does not preclude SARS-Cov-2 infection and should not be used as the sole basis for treatment or other patient management decisions. A negative result may occur with  improper specimen collection/handling, submission of specimen other than nasopharyngeal swab, presence of viral mutation(s) within the areas targeted by this assay, and inadequate number of viral copies(<138 copies/mL). A negative result must be combined with clinical observations, patient history, and epidemiological information. The expected result is Negative.  Fact Sheet for Patients:  EntrepreneurPulse.com.au  Fact Sheet for Healthcare Providers:  IncredibleEmployment.be  This test is no t yet approved or cleared by the [place] FDA and  has been authorized for detection and/or diagnosis of SARS-CoV-2 by FDA under an Emergency Use Authorization (EUA). This EUA will remain  in effect (meaning this test can be used)  for the duration of the COVID-19 declaration under Section 564(b)(1) of the Act, 21 U.S.C.section 360bbb-3(b)(1), unless the authorization is terminated  or revoked sooner.       Influenza A by PCR NEGATIVE NEGATIVE Final   Influenza B by PCR NEGATIVE NEGATIVE Final    Comment: (NOTE) The Xpert Xpress SARS-CoV-2/FLU/RSV plus assay is intended as an aid  in the diagnosis of influenza from Nasopharyngeal swab specimens and should not be used as a sole basis for treatment. Nasal washings and aspirates are unacceptable for Xpert Xpress SARS-CoV-2/FLU/RSV testing.  Fact Sheet for Patients: EntrepreneurPulse.com.au  Fact Sheet for Healthcare Providers: IncredibleEmployment.be  This test is not yet approved or cleared by the [place] FDA and has been authorized for detection and/or diagnosis of SARS-CoV-2 by FDA under an Emergency Use Authorization (EUA). This EUA will remain in effect (meaning this test can be used) for the duration of the COVID-19 declaration under Section 564(b)(1) of the Act, 21 U.S.C. section 360bbb-3(b)(1), unless the authorization is terminated or revoked.  Performed at [facility], [address] [place], [place] [address]   Culture, blood (routine x 2)     Status: None (Preliminary result)   Collection Time: 12/17/21  1:44 PM   Specimen: BLOOD LEFT ARM  Result Value Ref Range Status   Specimen Description BLOOD LEFT ARM  Final   Special Requests   Final    BOTTLES DRAWN AEROBIC AND ANAEROBIC Blood Culture adequate volume   Culture   Final    NO GROWTH < 24 HOURS Performed at [facility], [place] [address] [place], [place] [address]    Report Status PENDING  Incomplete  Culture, blood (routine x 2)     Status: None (Preliminary result)   Collection Time: 12/17/21  1:46 PM   Specimen: BLOOD  Result Value Ref Range Status   Specimen Description BLOOD LEFT ANTECUBITAL  Final   Special  Requests   Final    BOTTLES DRAWN AEROBIC AND ANAEROBIC Blood Culture adequate volume   Culture   Final    NO GROWTH < 24 HOURS Performed at [facility], [place] [address] [place], [place] [address]    Report Status PENDING  Incomplete  Aerobic Culture w Gram Stain (superficial specimen)     Status: None (Preliminary result)   Collection Time: 12/18/21  8:25 AM   Specimen: Abscess  Result Value Ref Range Status   Specimen Description ABSCESS  Final   Special Requests NONE  Final   Gram Stain   Final    RARE WBC PRESENT, PREDOMINANTLY PMN RARE GRAM POSITIVE COCCI    Culture   Final    FEW STAPHYLOCOCCUS AUREUS SUSCEPTIBILITIES TO FOLLOW Performed at [facility], [place] [address], [place], [place] [address]    Report Status PENDING  Incomplete     Signed: Marlowe Aschoff Jerianne Anselmo  [facility] 12/19/2021, 11:41 AM

## 2021-12-19 NOTE — Progress Notes (Signed)
DISCHARGE NOTE HOME Ashley Orr to be discharged Home per MD order. Discussed prescriptions and follow up appointments with the patient. Prescriptions given to patient; medication list explained in detail. Patient verbalized understanding.  Skin clean, dry and intact without evidence of skin break down, no evidence of skin tears noted. IV catheter discontinued intact. Site without signs and symptoms of complications. Dressing and pressure applied. Pt denies pain at the site currently. No complaints noted.  Patient free of lines, drains, and wounds.   An After Visit Summary (AVS) was printed and given to the patient. Patient escorted via wheelchair, and discharged home via private auto.  Arlyss Repress, RN

## 2021-12-19 NOTE — Progress Notes (Signed)
Ok to change vanc to PO linezolid for 4 more days starting 1/31 to complete 7d for cellulitis per Dr. Pietro Cassis. Due to short course, risk for serotonin syndrome is minimal.  Onnie Boer, PharmD, BCIDP, AAHIVP, CPP Infectious Disease Pharmacist 12/19/2021 11:13 AM

## 2021-12-19 NOTE — Plan of Care (Signed)
  Problem: Education: Goal: Knowledge of General Education information will improve Description Including pain rating scale, medication(s)/side effects and non-pharmacologic comfort measures Outcome: Progressing   

## 2021-12-19 NOTE — TOC Transition Note (Signed)
Transition of Care [facility]) - CM/SW Discharge Note   Patient Details  Name: Ashley Orr MRN: [id number] Date of Birth: [birth year]  Transition of [facility]) CM/SW Contact:  Tom-Johnson, Renea Ee, RN Phone Number: 12/19/2021, 1:14 PM   Clinical Narrative:     Patient is scheduled for discharge today. Admitted for Face Cellulitis. CM spoke to patient at bedside about post hospital followup. Patient states she recently moved to [place] from [place]. Lives at home with her boyfriend. Has three children. Employed as a Armed forces logistics/support/administrative officer at [employer]. Independent with care and drives self prior to hospitalization. Does not own and states she does not need DME's. Followup appointment scheduled with [facility]. Information on AVS. Boyfriend to transport at discharge. No further TOC needs noted.   Final next level of care: Home/Self Care Barriers to Discharge: Barriers Resolved   Patient Goals and CMS Choice Patient states their goals for this hospitalization and ongoing recovery are:: To return home CMS Medicare.gov Compare Post Acute Care list provided to:: Patient Choice offered to / list presented to : NA  Discharge Placement                       Discharge Plan and Services                DME Arranged: N/A DME Agency: NA       HH Arranged: NA HH Agency: NA        Social Determinants of Health (SDOH) Interventions     Readmission Risk Interventions No flowsheet data found.

## 2021-12-20 LAB — AEROBIC CULTURE W GRAM STAIN (SUPERFICIAL SPECIMEN)

## 2021-12-22 LAB — CULTURE, BLOOD (ROUTINE X 2)
Culture: NO GROWTH
Culture: NO GROWTH
Special Requests: ADEQUATE
Special Requests: ADEQUATE

## 2022-01-12 ENCOUNTER — Encounter: Payer: BC Managed Care – PPO | Admitting: Nurse Practitioner

## 2022-01-12 ENCOUNTER — Telehealth: Payer: 59 | Admitting: Nurse Practitioner

## 2022-01-12 DIAGNOSIS — N946 Dysmenorrhea, unspecified: Secondary | ICD-10-CM | POA: Diagnosis not present

## 2022-01-12 MED ORDER — NAPROXEN 500 MG PO TABS: 500.0000 mg | ORAL_TABLET | Freq: Two times a day (BID) | ORAL | 1 refills | Status: DC

## 2022-01-12 MED ORDER — CYCLOBENZAPRINE HCL 10 MG PO TABS: 10.0000 mg | ORAL_TABLET | Freq: Three times a day (TID) | ORAL | 1 refills | Status: DC | PRN

## 2022-01-12 NOTE — Progress Notes (Signed)
Patient accidentally scheduled 2 appointments. Notes are on other appointment

## 2022-01-12 NOTE — Progress Notes (Signed)
Virtual Visit Consent   Ashley Orr, you are scheduled for a virtual visit with Mary-Margaret Hassell Done, [address], a [facility] provider, today.     Just as with appointments in the office, your consent must be obtained to participate.  Your consent will be active for this visit and any virtual visit you may have with one of our providers in the next 365 days.     If you have a MyChart account, a copy of this consent can be sent to you electronically.  All virtual visits are billed to your insurance company just like a traditional visit in the office.    As this is a virtual visit, video technology does not allow for your provider to perform a traditional examination.  This may limit your provider's ability to fully assess your condition.  If your provider identifies any concerns that need to be evaluated in person or the need to arrange testing (such as labs, EKG, etc.), we will make arrangements to do so.     Although advances in technology are sophisticated, we cannot ensure that it will always work on either your end or our end.  If the connection with a video visit is poor, the visit may have to be switched to a telephone visit.  With either a video or telephone visit, we are not always able to ensure that we have a secure connection.     I need to obtain your verbal consent now.   Are you willing to proceed with your visit today? YES   Ashley Orr has provided verbal consent on 01/12/2022 for a virtual visit (video or telephone).   Mary-Margaret Hassell Done, FNP   Date: 01/12/2022 12:26 PM   Virtual Visit via Video Note   I, Mary-Margaret Hassell Done, connected with Ashley Orr ([id number], 10-Jul-1971) on 01/12/22 at  1:15 PM EST by a video-enabled telemedicine application and verified that I am speaking with the correct person using two identifiers.  Location: Patient: Virtual Visit Location Patient: Home Provider: Virtual Visit Location Provider: Mobile   I  discussed the limitations of evaluation and management by telemedicine and the availability of in person appointments. The patient expressed understanding and agreed to proceed.    History of Present Illness: Ashley Orr is a 51 y.o. who identifies as a female who was assigned female at birth, and is being seen today for menustral  issues.  HPI: Patient schedules video visit to discuss menustral bleeding. She was dx with uterine fibroids last year. She started her menses on 01/10/22 and bleeding is real heavy with cramping. This is the second month that her menses has been this heavy. She as taken advil OTC and has not helped at all. She has used flexeril in the past which has helped.   Review of Systems  Gastrointestinal:  Negative for nausea and vomiting.   Problems:  Patient Active Problem List   Diagnosis Date Noted   Cellulitis of face 12/17/2021   Hypothyroidism (acquired) 12/17/2021   Iron deficiency anemia due to chronic blood loss 12/17/2021   Essential hypertension 12/09/2021   Smoker 01/12/2021   MDD (major depressive disorder), recurrent episode, moderate ([place]) 01/12/2021   Anemia 01/12/2021   Insomnia 01/12/2021    Allergies:  Allergies  Allergen Reactions   Codeine Anaphylaxis   Medications:  Current Outpatient Medications:    Cholecalciferol 50 MCG (2000 UT) CAPS, Take 2,000 Units by mouth daily., Disp: , Rfl:    citalopram (CELEXA) 40 MG tablet,  Take 40 mg by mouth daily., Disp: , Rfl:    cyclobenzaprine (FLEXERIL) 10 MG tablet, Take 10 mg by mouth 2 (two) times daily as needed for muscle spasms., Disp: , Rfl:    ferrous sulfate 325 (65 FE) MG tablet, Take 325 mg by mouth daily with breakfast., Disp: , Rfl:    hydrochlorothiazide (HYDRODIURIL) 25 MG tablet, Take 25 mg by mouth daily., Disp: , Rfl:    Ibuprofen-Acetaminophen (ADVIL DUAL ACTION) 125-250 MG TABS, Take 2 tablets by mouth every 6 (six) hours as needed (pain)., Disp: , Rfl:    levothyroxine  (SYNTHROID) 175 MCG tablet, Take 175 mcg by mouth See admin instructions. Alternating with 200 mcg every other day, Disp: , Rfl:    levothyroxine (SYNTHROID) 200 MCG tablet, Take 200 mcg by mouth See admin instructions. Alternating with 175 mcg every other day., Disp: , Rfl:    losartan (COZAAR) 50 MG tablet, Take 50 mg by mouth daily., Disp: , Rfl:    oxybutynin (DITROPAN-XL) 5 MG 24 hr tablet, Take 5 mg by mouth every evening., Disp: , Rfl:    traZODone (DESYREL) 100 MG tablet, Take 100 mg by mouth at bedtime., Disp: , Rfl:    venlafaxine XR (EFFEXOR-XR) 37.5 MG 24 hr capsule, Take 37.5 mg by mouth daily with breakfast., Disp: , Rfl:    vitamin C (ASCORBIC ACID) 500 MG tablet, Take 500 mg by mouth daily., Disp: , Rfl:   Observations/Objective: Patient is well-developed, well-nourished in no acute distress.  Resting comfortably  at home.  Head is normocephalic, atraumatic.  No labored breathing.  Speech is clear and coherent with logical content.  Patient is alert and oriented at baseline.    Assessment and Plan  Ashley Orr in today with chief complaint of Dysmenorrhea   1. Dysmenorrhea Warm compresses Keep appointment with new PCP in March  Meds ordered this encounter  Medications   naproxen (NAPROSYN) 500 MG tablet    Sig: Take 1 tablet (500 mg total) by mouth 2 (two) times daily with a meal.    Dispense:  60 tablet    Refill:  1    Order Specific Question:   Supervising Provider    Answer:   Sabra Heck, BRIAN [[id number]]   cyclobenzaprine (FLEXERIL) 10 MG tablet    Sig: Take 1 tablet (10 mg total) by mouth 3 (three) times daily as needed for muscle spasms.    Dispense:  30 tablet    Refill:  1    Order Specific Question:   Supervising Provider    Answer:   Noemi Chapel [[id number]]       Follow Up Instructions: I discussed the assessment and treatment plan with the patient. The patient was provided an opportunity to ask questions and all were answered. The patient  agreed with the plan and demonstrated an understanding of the instructions.  A copy of instructions were sent to the patient via MyChart.  The patient was advised to call back or seek an in-person evaluation if the symptoms worsen or if the condition fails to improve as anticipated.  Time:  I spent 8 minutes with the patient via telehealth technology discussing the above problems/concerns.    Mary-Margaret Hassell Done, FNP

## 2022-01-12 NOTE — Patient Instructions (Signed)
Dysmenorrhea Dysmenorrhea means cramps during your period (menstrual period) that cause pain in your lower belly (abdomen). The pain is caused by the tightening (contracting) of the muscles of the womb (uterus). The pain may be mild or very bad. Primary dysmenorrhea is cramps that last a couple of days when a woman starts having periods or soon after. As a woman gets older or has a baby, the cramps will usually lessen or disappear. Secondary dysmenorrhea begins later in life and is caused by other problems. What are the causes? This condition may be caused by problems with the: Tissue that lines the womb. This tissue may grow: Outside of the womb. Into the walls of the womb. Blood vessels in the area between your hip bones (pelvis). Tissue in the lower part of the womb (cervix), including growths (polyps). Muscles that hold up the womb. Bladder. Bowels. It can also be caused by cancer. Other causes include: A very tipped womb. The lower part of the womb having a small opening. Tumors in the womb that are not cancer. Pelvic inflammatory disease (PID). Scars from surgeries you have had. A cyst in the ovaries. An IUD (intrauterine device). What increases the risk? Being younger than age 69. Having started puberty early. Having irregular bleeding or heavy bleeding. Never having given birth. Having a family history of period cramps. Smoking or using products with nicotine. Having a high body weight or a low body weight. What are the signs or symptoms? Cramps and pain in the lower belly or lower back. A feeling of fullness in the lower belly. Periods lasting for longer than 7 days. Headaches. Bloating. Tiredness (fatigue). Feeling like you may vomit (nauseous) or vomiting. Watery poop (diarrhea) or loose poop (stool). Sweating. Dizziness. How is this treated? Treatment depends on the cause of the cramps. Treatment may include medicines, such as: Medicines for pain. Medicines for  bleeding. Body chemical (hormone) replacement therapy. Shots (injections) to stop the menstrual period. Birth control pills. An IUD. NSAIDs, such as ibuprofen. Other treatments may include: Surgeries. Procedures. Nerve stimulation. Doing exercises. Yoga and alternative treatments. Work with your doctor to find what treatments are best for you. Follow these instructions at home: Helping pain and cramping  If told, put heat on your lower back or belly when you have pain or cramps. Do this as often as told by your doctor. Use the heat source that your doctor recommends, such as a moist heat pack or a heating pad. Place a towel between your skin and the heat. Leave the heat on for 20-30 minutes. Take off the heat if your skin turns bright red. This is very important. If you cannot feel pain, heat, or cold, you have a greater risk of getting burned. Do not sleep with a heating pad. Exercise. Walking, swimming, or biking can help take away cramps. Massage your lower back or belly. This may help lessen pain. General instructions Take over-the-counter and prescription medicines only as told by your doctor. Ask your doctor if you should avoid driving or using machines while you are taking your medicine. Avoid alcohol and caffeine during and right before your period. These can make cramps worse. Do not smoke or use any products that contain nicotine or tobacco. If you need help quitting, ask your doctor. Keep all follow-up visits. Contact a doctor if: You have pain that gets worse. You have pain that does not get better with medicine. You have pain during sex. You feel like you may vomit or you vomit  during your period and medicine does not help. Get help right away if: You faint. Summary Dysmenorrhea means painful cramps during your period. Put heat on your lower back or belly when you have pain or cramps. Do exercises like walking, swimming, or biking to help with cramps. Contact a  doctor if you have pain during sex. This information is not intended to replace advice given to you by your health care provider. Make sure you discuss any questions you have with your health care provider. Document Revised: 06/23/2020 Document Reviewed: 06/23/2020 Elsevier Patient Education  2022 [employer].

## 2022-01-27 NOTE — Progress Notes (Signed)
Subjective:    Ashley Orr - 51 y.o. female MRN [id number]  Date of birth: [birth year]  HPI  Ashley Orr is to establish care and hospital discharge follow-up.   Current issues and/or concerns: HOSPITAL DISCHARGE FOLLOW-UP: 12/17/2021 - 12/19/2021 [facility] per MD note: Principal Problem:   Cellulitis of face Active Problems:   Essential hypertension   Hypothyroidism (acquired)   Iron deficiency anemia due to chronic blood loss   Left chin cellulitis -CT imaging as above. -Clinically considered for MRSA infection.  No need of surgical intervention.  She was treated with IV antibiotics.  Switch to oral Zyvox today.  4 more days of Zyvox at home to complete 1 week of antibiotics. -Follow-up with ENT as an outpatient if needed. -Limited course of Norco for pain control.   Poor dental hygiene -CT maxillofacial showed lucency around her lower incisors.  Patient also reports recent tooth abscess. -Need to see dentistry as an outpatient   Chronic iron deficiency anemia Chronic menstrual bleeding Uterine fibroids -History of fibroids with chronic menstrual bleed.  Hemoglobin at baseline between 7 and 8.  Patient reports she has never required blood transfusion. -Patient is in the process to see OB/GYN as an outpatient. -Continue oral iron supplement.   Hypothyroidism -Continue Synthroid   Hypertension -Home meds include HCTZ 25 mg daily, losartan 50 mg daily -Continue same.   Anxiety/depression Continue Effexor, trazodone, Celexa  Today's Visit 01/30/2022: Left chin cellulitis follow-up: Completed antibiotics prescribed at hospital discharge. Left chin back to normal excluding a tiny bump which doesn't cause pain and no drainage.   Poor dental hygiene follow-up: Left lower back tooth still hurting. Needs referral to dentist.   Chronic iron deficiency anemia follow-up: Chronic menstrual bleeding follow-up: Uterine fibroids  follow-up: Still taking iron supplement purchased over-the-counter. Advil Dual action not helping with fibroids pain, requesting Ibuprofen 800 mg. Ditropan helping with frequent urination secondary to fibroids. Has not established appointment with Gynecology as of present.   Hypothyroidism follow-up: Taking Levothyroxine 200 mg and 175 mg doses on alternating days. Reports not taking as prescribed sometimes missing several days weekly.  Hypertension follow-up: Taking Hydrochlorothiazide and Losartan but not as prescribed sometimes missing several days weekly. Trying over-the-counter natural remedies to assist with blood pressure such as beets and green veggies.  Have you taken your blood pressure medication today: '[]'$  Yes '[x]'$  No  Med Adherence: '[]'$  Yes    '[x]'$  No Medication side effects: '[]'$  Yes    '[x]'$  No Adherence with salt restriction (low-salt diet): '[x]'$  Yes  '[]'$  No Exercise: Yes '[]'$  No '[x]'$  Home Monitoring?: '[x]'$  Yes    '[]'$  No Monitoring Frequency: '[]'$  Yes    '[x]'$  No Smoking '[x]'$  Yes, 1 pack daily  SOB? '[]'$  Yes    '[x]'$  No Chest Pain?: '[]'$  Yes    '[x]'$  No  Anxiety/depression follow-up: Reports does not have anxiety only depression. Primarily related to when both parents passed away in 27-Feb-2012. Reports some days are better than others. Sometimes sleeping a lot. Taking Celexa and Trazodone. Doesn't feel medications are helping as much as she would like. Open to trying an add on medication but concerned about side effects of nightmares. Not interested in referral to Psychiatry or counseling as of present. 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 Denies thoughts of self-harm, suicidal ideations, and homicidal ideations.  Weight Management: Goal weight 180 pounds. Weight typically fluctuates between 185 pounds to 220 pounds. Reports has improved diet. Does not want  to exercise.   Depression screen [facility] 2/9 01/30/2022  Decreased Interest 0  Down, Depressed, Hopeless 0  PHQ - 2 Score 0   Altered sleeping 0  Tired, decreased energy 0  Change in appetite 0  Feeling bad or failure about yourself  0  Trouble concentrating 0  Moving slowly or fidgety/restless 0  PHQ-9 Score 0  Difficult doing work/chores Not difficult at all     ROS per HPI   Health Maintenance:  Health Maintenance Due  Topic Date Due   COVID-19 Vaccine (1) Never done   Hepatitis C Screening  Never done   TETANUS/TDAP  Never done   PAP SMEAR-Modifier  Never done   COLONOSCOPY (Pts 45-47yr Insurance coverage will need to be confirmed)  Never done   MAMMOGRAM  Never done   Zoster Vaccines- Shingrix (1 of 2) Never done     Past Medical History: Patient Active Problem List   Diagnosis Date Noted   Cellulitis of face 12/17/2021   Iron deficiency anemia 01/13/2021   Smoker 01/12/2021   MDD (major depressive disorder), recurrent episode, moderate [place] 01/12/2021   Anemia 01/12/2021   Insomnia 01/12/2021   Stress incontinence 01/12/2021   Health care maintenance 01/01/2020   Essential hypertension 12/29/2019   Acquired hypothyroidism 12/29/2019    Social History   reports that she has been smoking cigarettes. She has never used smokeless tobacco.   Family History  family history is not on file.   Medications: reviewed and updated   Objective:   Physical Exam BP (!) 158/94 (BP Location: Left Arm, Patient Position: Sitting, Cuff Size: Large)    Pulse 84    Temp 98.5 F (36.9 C)    Resp 18    Ht '5\' 2"'$  (1.575 m)    Wt 208 lb (94.3 kg)    SpO2 96%    BMI 38.04 kg/m   Physical Exam HENT:     Head: Normocephalic and atraumatic.  Eyes:     Extraocular Movements: Extraocular movements intact.     Conjunctiva/sclera: Conjunctivae normal.     Pupils: Pupils are equal, round, and reactive to light.  Cardiovascular:     Rate and Rhythm: Normal rate and regular rhythm.     Pulses: Normal pulses.     Heart sounds: Normal heart sounds.  Pulmonary:     Effort: Pulmonary effort is normal.      Breath sounds: Normal breath sounds.  Musculoskeletal:     Cervical back: Normal range of motion and neck supple.  Neurological:     General: No focal deficit present.     Mental Status: She is alert and oriented to person, place, and time.  Psychiatric:        Mood and Affect: Mood normal.        Behavior: Behavior normal.      Assessment & Plan:  1. Encounter to establish care: - Patient presents today to establish care.  - Return for annual physical examination, labs, and health maintenance. Arrive fasting meaning having no food for at least 8 hours prior to appointment. You may have only water or black coffee. Please take scheduled medications as normal.  2. Hospital discharge follow-up: - Reviewed hospital course, current medications, ensured proper follow-up in place, and addressed concerns.   3. Cellulitis of chin: - Resolved.   4. Poor dental hygiene: - Referral to Dentistry for further evaluation and management.  - Ambulatory referral to Dentistry  5. Iron deficiency anemia due to chronic blood  loss: 6. Disorder of menstrual bleeding: 7. Uterine leiomyoma, unspecified location: - Continue over-the-counter ferrous sulfate.  - Ibuprofen as prescribed for pain secondary to uterine leiomyoma. - Oxybutynin as prescribed for urinary frequency secondary to uterine leiomyoma. - Referral to Gynecology for further evaluation and management.  - Ambulatory referral to Gynecology - ibuprofen (ADVIL) 800 MG tablet; Take 1 tablet (800 mg total) by mouth every 8 (eight) hours as needed.  Dispense: 30 tablet; Refill: 0 - oxybutynin (DITROPAN-XL) 5 MG 24 hr tablet; Take 1 tablet (5 mg total) by mouth every evening.  Dispense: 30 tablet; Refill: 2  8. Hypothyroidism (acquired): - Continue Levothyroxine as prescribed.  - Referral to Endocrinology for further evaluation and management.  - levothyroxine (SYNTHROID) 200 MCG tablet; Take 1 tablet (200 mcg total) by mouth See admin  instructions. Alternating with 175 mcg every other day.  Dispense: 90 tablet; Refill: 0 - levothyroxine (SYNTHROID) 175 MCG tablet; Take 1 tablet (175 mcg total) by mouth See admin instructions. Alternating with 200 mcg every other day  Dispense: 90 tablet; Refill: 0 - Ambulatory referral to Endocrinology  9. Essential (primary) hypertension: - Blood pressure not at goal during today's visit. Patient asymptomatic without chest pressure, chest pain, palpitations, shortness of breath, worst headache of life, and any additional red flag symptoms. - Patient has not taken blood pressure medications today as of present.  - Continue Hydrochlorothiazide and Losartan as prescribed.  - Counseled on blood pressure goal of less than 130/80, low-sodium, DASH diet, medication compliance, 150 minutes of moderate intensity exercise per week as tolerated. Discussed medication compliance, adverse effects. - Follow-up with primary provider in 2 weeks or sooner if needed.  - hydrochlorothiazide (HYDRODIURIL) 25 MG tablet; Take 1 tablet (25 mg total) by mouth daily.  Dispense: 30 tablet; Refill: 0 - losartan (COZAAR) 50 MG tablet; Take 1 tablet (50 mg total) by mouth daily.  Dispense: 30 tablet; Refill: 0  10. Depression, unspecified depression type: - Patient denies thoughts of self-harm, suicidal ideations, homicidal ideations. - Continue Citalopram and Trazodone as prescribed.  - Begin Bupropion as prescribed. Counseled on medication adherence and adverse effects.  - Follow-up with primary provider in 4 weeks or sooner if needed.  - citalopram (CELEXA) 40 MG tablet; Take 1 tablet (40 mg total) by mouth daily.  Dispense: 30 tablet; Refill: 2 - buPROPion (WELLBUTRIN SR) 150 MG 12 hr tablet; Take 1 tablet (150 mg total) by mouth daily.  Dispense: 30 tablet; Refill: 0 - traZODone (DESYREL) 100 MG tablet; Take 1 tablet (100 mg total) by mouth at bedtime.  Dispense: 30 tablet; Refill: 2  11. Encounter for weight  management: - Phentermine as prescribed. Counseled on medication adherence and adverse effects. Patient verbalized understanding.  - Counseled patient that Phentermine is not intended for long-term use and that a medication holiday will need to begin after 3 months of taking medication. Counseled weight loss goal of 5% within 12 weeks or recommendation to discontinue regimen. Also, counseled on the importance of weight checks every 4 weeks. Patient verbalized understanding. - I did check the [place] and [place] prescription drug database and found no frequent prescribers of opiates or evidence of aberrant behavior. - Counseled on low-sodium DASH diet and 150 minutes of moderate intensity exercise per week as tolerated to assist with weight management.  - Follow-up with primary provider in 4 weeks or sooner if needed. - phentermine 15 MG capsule; Take 1 capsule (15 mg total) by mouth every morning.  Dispense: 30  capsule; Refill: 0    Patient was given clear instructions to go to Emergency Department or return to medical center if symptoms don't improve, worsen, or new problems develop.The patient verbalized understanding.  I discussed the assessment and treatment plan with the patient. The patient was provided an opportunity to ask questions and all were answered. The patient agreed with the plan and demonstrated an understanding of the instructions.   The patient was advised to call back or seek an in-person evaluation if the symptoms worsen or if the condition fails to improve as anticipated.    Durene Fruits, NP 01/30/2022, 8:55 PM Primary Care at [facility]

## 2022-01-30 ENCOUNTER — Encounter: Payer: Self-pay | Admitting: Family

## 2022-01-30 ENCOUNTER — Other Ambulatory Visit: Payer: Self-pay

## 2022-01-30 ENCOUNTER — Ambulatory Visit (INDEPENDENT_AMBULATORY_CARE_PROVIDER_SITE_OTHER): Payer: BC Managed Care – PPO | Admitting: Family

## 2022-01-30 VITALS — BP 158/94 | HR 84 | Temp 98.5°F | Resp 18 | Ht 62.0 in | Wt 208.0 lb

## 2022-01-30 DIAGNOSIS — D259 Leiomyoma of uterus, unspecified: Secondary | ICD-10-CM

## 2022-01-30 DIAGNOSIS — Z09 Encounter for follow-up examination after completed treatment for conditions other than malignant neoplasm: Secondary | ICD-10-CM

## 2022-01-30 DIAGNOSIS — F32A Depression, unspecified: Secondary | ICD-10-CM

## 2022-01-30 DIAGNOSIS — N939 Abnormal uterine and vaginal bleeding, unspecified: Secondary | ICD-10-CM

## 2022-01-30 DIAGNOSIS — L03211 Cellulitis of face: Secondary | ICD-10-CM

## 2022-01-30 DIAGNOSIS — I10 Essential (primary) hypertension: Secondary | ICD-10-CM

## 2022-01-30 DIAGNOSIS — E039 Hypothyroidism, unspecified: Secondary | ICD-10-CM

## 2022-01-30 DIAGNOSIS — Z7689 Persons encountering health services in other specified circumstances: Secondary | ICD-10-CM

## 2022-01-30 DIAGNOSIS — Z9189 Other specified personal risk factors, not elsewhere classified: Secondary | ICD-10-CM

## 2022-01-30 DIAGNOSIS — D500 Iron deficiency anemia secondary to blood loss (chronic): Secondary | ICD-10-CM

## 2022-01-30 DIAGNOSIS — N926 Irregular menstruation, unspecified: Secondary | ICD-10-CM

## 2022-01-30 MED ORDER — OXYBUTYNIN CHLORIDE ER 5 MG PO TB24: 5.0000 mg | ORAL_TABLET | Freq: Every evening | ORAL | 2 refills | Status: DC

## 2022-01-30 MED ORDER — LEVOTHYROXINE SODIUM 175 MCG PO TABS: 175.0000 ug | ORAL_TABLET | ORAL | 0 refills | Status: DC

## 2022-01-30 MED ORDER — BUPROPION HCL ER (SR) 150 MG PO TB12: 150.0000 mg | ORAL_TABLET | Freq: Every day | ORAL | 0 refills | Status: DC

## 2022-01-30 MED ORDER — CITALOPRAM HYDROBROMIDE 40 MG PO TABS: 40.0000 mg | ORAL_TABLET | Freq: Every day | ORAL | 2 refills | Status: DC

## 2022-01-30 MED ORDER — LOSARTAN POTASSIUM 50 MG PO TABS: 50.0000 mg | ORAL_TABLET | Freq: Every day | ORAL | 0 refills | Status: DC

## 2022-01-30 MED ORDER — IBUPROFEN 800 MG PO TABS: 800.0000 mg | ORAL_TABLET | Freq: Three times a day (TID) | ORAL | 0 refills | Status: DC | PRN

## 2022-01-30 MED ORDER — PHENTERMINE HCL 15 MG PO CAPS: 15.0000 mg | ORAL_CAPSULE | ORAL | 0 refills | Status: DC

## 2022-01-30 MED ORDER — LEVOTHYROXINE SODIUM 200 MCG PO TABS: 200.0000 ug | ORAL_TABLET | ORAL | 0 refills | Status: DC

## 2022-01-30 MED ORDER — TRAZODONE HCL 100 MG PO TABS: 100.0000 mg | ORAL_TABLET | Freq: Every day | ORAL | 2 refills | Status: DC

## 2022-01-30 MED ORDER — HYDROCHLOROTHIAZIDE 25 MG PO TABS: 25.0000 mg | ORAL_TABLET | Freq: Every day | ORAL | 0 refills | Status: DC

## 2022-01-30 NOTE — Progress Notes (Signed)
Pt presents to establish care and hfu,  ?Pt request referral to Gyn for history of fibroids  ?Needs med refills  ?Pt states that she has not been taking BP meds regularly  ?

## 2022-02-20 ENCOUNTER — Ambulatory Visit: Payer: Self-pay | Admitting: Family

## 2022-02-22 ENCOUNTER — Other Ambulatory Visit: Payer: Self-pay | Admitting: Family

## 2022-02-22 ENCOUNTER — Other Ambulatory Visit: Payer: Self-pay

## 2022-02-22 DIAGNOSIS — F32A Depression, unspecified: Secondary | ICD-10-CM

## 2022-02-22 DIAGNOSIS — I10 Essential (primary) hypertension: Secondary | ICD-10-CM

## 2022-02-22 DIAGNOSIS — D259 Leiomyoma of uterus, unspecified: Secondary | ICD-10-CM

## 2022-02-22 MED ORDER — LOSARTAN POTASSIUM 50 MG PO TABS: 50.0000 mg | ORAL_TABLET | Freq: Every day | ORAL | 0 refills | Status: DC

## 2022-02-22 MED ORDER — OXYBUTYNIN CHLORIDE ER 5 MG PO TB24: 5.0000 mg | ORAL_TABLET | Freq: Every evening | ORAL | 0 refills | Status: AC

## 2022-02-22 MED ORDER — HYDROCHLOROTHIAZIDE 25 MG PO TABS: 25.0000 mg | ORAL_TABLET | Freq: Every day | ORAL | 0 refills | Status: DC

## 2022-02-22 NOTE — Telephone Encounter (Signed)
Losartan refilled per patient request.

## 2022-02-22 NOTE — Telephone Encounter (Signed)
Losartan refilled per patient request. ?

## 2022-03-02 ENCOUNTER — Encounter: Payer: Self-pay | Admitting: Family

## 2022-03-04 NOTE — Progress Notes (Signed)
Erroneous encounter

## 2022-03-09 ENCOUNTER — Encounter: Payer: Self-pay | Admitting: Family

## 2022-03-09 DIAGNOSIS — F32A Depression, unspecified: Secondary | ICD-10-CM

## 2022-03-09 DIAGNOSIS — I10 Essential (primary) hypertension: Secondary | ICD-10-CM

## 2022-03-09 DIAGNOSIS — Z1231 Encounter for screening mammogram for malignant neoplasm of breast: Secondary | ICD-10-CM

## 2022-03-09 DIAGNOSIS — Z0000 Encounter for general adult medical examination without abnormal findings: Secondary | ICD-10-CM

## 2022-03-09 DIAGNOSIS — Z7689 Persons encountering health services in other specified circumstances: Secondary | ICD-10-CM

## 2022-03-09 DIAGNOSIS — Z131 Encounter for screening for diabetes mellitus: Secondary | ICD-10-CM

## 2022-03-09 DIAGNOSIS — Z13220 Encounter for screening for lipoid disorders: Secondary | ICD-10-CM

## 2022-03-09 DIAGNOSIS — Z124 Encounter for screening for malignant neoplasm of cervix: Secondary | ICD-10-CM

## 2022-03-09 DIAGNOSIS — Z1159 Encounter for screening for other viral diseases: Secondary | ICD-10-CM

## 2022-03-09 DIAGNOSIS — Z1211 Encounter for screening for malignant neoplasm of colon: Secondary | ICD-10-CM

## 2022-03-09 DIAGNOSIS — Z113 Encounter for screening for infections with a predominantly sexual mode of transmission: Secondary | ICD-10-CM

## 2022-03-10 NOTE — Progress Notes (Signed)
Virtual Visit via Telephone Note ? ?I connected with Ashley Orr, on 3/61/4431 at 10:03 AM by telephone and verified that I am speaking with the correct person using two identifiers. ?  ?Consent: ?I discussed the limitations, risks, security and privacy concerns of performing an evaluation and management service by telephone and the availability of in person appointments. I also discussed with the patient that there may be a patient responsible charge related to this service. The patient expressed understanding and agreed to proceed. ? ? ?Location of Patient: ?Home ? ?Location of Provider: ?[facility] ? ? ?Persons participating in Telemedicine visit: ?Fairy Ashlock ?Durene Fruits, NP ? ?History of Present Illness: ?Ashley Orr is a 51 year-old female who presents for follow-up. ? ?Hypertension follow-up: ?01/30/2022: ?- Continue Hydrochlorothiazide and Losartan as prescribed.  ? ?03/16/2022: ?Doing well on current regimen. No side effects. No issues/concerns. Denies chest pain, shortness of breath, worst headache of life and additional red flag symptoms.  ? ?2. Depression follow-up: ?01/30/2022: ?- Continue Citalopram and Trazodone as prescribed.  ?- Begin Bupropion as prescribed. ? ?03/16/2022: ?Reports initially had nightmares but since then has resolved. Doing well on current regimen without additional issues or concerns.  ? ?3. Weight management follow-up: ?01/30/2022: ?- Phentermine as prescribed.  ? ?03/16/2022: ?Doing well on current regimen, no issues/concerns. Weight between 198 pounds to 200 pounds. She is monitoring what she eats and drinking plenty of water. Has not began exercising as of yet related to bilateral lower extremity sciatica right > left. Requesting refills of Cyclobenzaprine. Reports standing and walking extensively causes pain. Pain radiates throughout bilateral lower extremities.  ? ?4. Vaginal itching: ?Reports vaginal itching  without additional symptoms.  ? ?Past Medical History:  ?Diagnosis Date  ? Hypertension   ? Hypothyroidism (acquired) 12/17/2021  ? Thyroid disease   ? ?Allergies  ?Allergen Reactions  ? Codeine Anaphylaxis  ? ? ?Current Outpatient Medications on File Prior to Visit  ?Medication Sig Dispense Refill  ? buPROPion (WELLBUTRIN SR) 150 MG 12 hr tablet TAKE 1 TABLET BY MOUTH EVERY DAY 90 tablet 0  ? Cholecalciferol 50 MCG (2000 UT) CAPS Take 2,000 Units by mouth daily.    ? citalopram (CELEXA) 40 MG tablet TAKE 1 TABLET BY MOUTH EVERY DAY 90 tablet 0  ? cyclobenzaprine (FLEXERIL) 10 MG tablet Take 1 tablet (10 mg total) by mouth 3 (three) times daily as needed for muscle spasms. 30 tablet 1  ? ferrous sulfate 325 (65 FE) MG tablet Take 325 mg by mouth daily with breakfast.    ? hydrochlorothiazide (HYDRODIURIL) 25 MG tablet Take 1 tablet (25 mg total) by mouth daily. 90 tablet 0  ? ibuprofen (ADVIL) 800 MG tablet Take 1 tablet (800 mg total) by mouth every 8 (eight) hours as needed. 30 tablet 0  ? levothyroxine (SYNTHROID) 175 MCG tablet Take 1 tablet (175 mcg total) by mouth See admin instructions. Alternating with 200 mcg every other day 90 tablet 0  ? levothyroxine (SYNTHROID) 200 MCG tablet Take 1 tablet (200 mcg total) by mouth See admin instructions. Alternating with 175 mcg every other day. 90 tablet 0  ? losartan (COZAAR) 50 MG tablet Take 1 tablet (50 mg total) by mouth daily. 90 tablet 0  ? oxybutynin (DITROPAN-XL) 5 MG 24 hr tablet Take 1 tablet (5 mg total) by mouth every evening. 90 tablet 0  ? phentermine 15 MG capsule Take 1 capsule (15 mg total) by mouth every morning. [place]  capsule 0  ? traZODone (DESYREL) 100 MG tablet Take 1 tablet (100 mg total) by mouth at bedtime. 90 tablet 0  ? vitamin C (ASCORBIC ACID) 500 MG tablet Take 500 mg by mouth daily.    ? ?No current facility-administered medications on file prior to visit.  ? ? ?Observations/Objective: ?Alert and oriented x 3. Not in acute distress. Physical  examination not completed as this is a telemedicine visit. ? ?Assessment and Plan: ?1. Essential (primary) hypertension: ?- Continue Hydrochlorothiazide and Losartan as prescribed. No refills needed as of present.  ?- Counseled on blood pressure goal of less than 130/80, low-sodium, DASH diet, medication compliance, and 150 minutes of moderate intensity exercise per week as tolerated. Counseled on medication adherence and adverse effects. ?- Follow-up with primary provider in 3 months or sooner if needed.  ? ?2. Depression, unspecified depression type: ?- Patient denies thoughts of self-harm, suicidal ideations, homicidal ideations. ?- Continue Citalopram, Trazodone, and Bupropion as prescribed. No refills needed as of present.  ?- Follow-up with primary provider in 3 months or sooner if needed.  ? ?3. Encounter for weight management: ?- Increase Phentermine from 15 mg daily to 30 mg daily.  ?- Follow-up with primary provider in 4 weeks or sooner if needed for weight check. ?- phentermine 30 MG capsule; Take 1 capsule (30 mg total) by mouth every morning.  Dispense: 30 capsule; Refill: 0 ? ?4. Bilateral sciatica: ?- Continue Cyclobenzaprine as prescribed. Counseled on medication adherence and adverse effects.  ?- Follow-up with primary provider as scheduled.  ?- cyclobenzaprine (FLEXERIL) 10 MG tablet; Take 1 tablet (10 mg total) by mouth 3 (three) times daily as needed for muscle spasms.  Dispense: 30 tablet; Refill: 2 ? ?5. Vaginal itching: ?- Screening for possible causes.  ?- POCT URINALYSIS DIP (CLINITEK); Future ?- Cervicovaginal ancillary only; Future ? ? ?Follow Up Instructions: ?- Follow-up in 3 months or sooner if needed for HTN, depression. ?- Follow-up in 4 weeks or sooner if needed for weight check. ?- Report to office when best for patient for point-of-care testing listed above. ?  ?Patient was given clear instructions to go to Emergency Department or return to medical center if symptoms don't improve,  worsen, or new problems develop.The patient verbalized understanding. ? ?I discussed the assessment and treatment plan with the patient. The patient was provided an opportunity to ask questions and all were answered. The patient agreed with the plan and demonstrated an understanding of the instructions. ?  ?The patient was advised to call back or seek an in-person evaluation if the symptoms worsen or if the condition fails to improve as anticipated. ? ? ? ?I provided 16 minutes total of non-face-to-face time during this encounter. ? ? ?Camillia Herter, NP  ?[facility] ?[place], [place] ?[phone] ?03/16/2022, 10:03 AM ?

## 2022-03-16 ENCOUNTER — Ambulatory Visit (INDEPENDENT_AMBULATORY_CARE_PROVIDER_SITE_OTHER): Payer: Self-pay | Admitting: Family

## 2022-03-16 DIAGNOSIS — M5432 Sciatica, left side: Secondary | ICD-10-CM

## 2022-03-16 DIAGNOSIS — M5431 Sciatica, right side: Secondary | ICD-10-CM

## 2022-03-16 DIAGNOSIS — F32A Depression, unspecified: Secondary | ICD-10-CM

## 2022-03-16 DIAGNOSIS — Z7689 Persons encountering health services in other specified circumstances: Secondary | ICD-10-CM

## 2022-03-16 DIAGNOSIS — N898 Other specified noninflammatory disorders of vagina: Secondary | ICD-10-CM

## 2022-03-16 DIAGNOSIS — I10 Essential (primary) hypertension: Secondary | ICD-10-CM

## 2022-03-16 MED ORDER — PHENTERMINE HCL 30 MG PO CAPS: 30.0000 mg | ORAL_CAPSULE | ORAL | 0 refills | Status: DC

## 2022-03-16 MED ORDER — CYCLOBENZAPRINE HCL 10 MG PO TABS: 10.0000 mg | ORAL_TABLET | Freq: Three times a day (TID) | ORAL | 2 refills | Status: DC | PRN

## 2022-03-17 ENCOUNTER — Other Ambulatory Visit: Payer: Self-pay | Admitting: Family

## 2022-03-17 DIAGNOSIS — N939 Abnormal uterine and vaginal bleeding, unspecified: Secondary | ICD-10-CM

## 2022-03-17 NOTE — Telephone Encounter (Signed)
Ibuprofen refilled per patient request.

## 2022-03-17 NOTE — Telephone Encounter (Signed)
Requested medication (s) are due for refill today - unsure ? ?Requested medication (s) are on the active medication list -yes ? ?Future visit scheduled -yes ? ?Last refill: 01/30/22 #30 ? ?Notes to clinic: Request RF: Abnormal Hgb/Hct ? ?Requested Prescriptions  ?Pending Prescriptions Disp Refills  ? ibuprofen (ADVIL) 800 MG tablet [Pharmacy Med Name: IBUPROFEN 800 MG TABLET] 30 tablet 0  ?  Sig: TAKE 1 TABLET BY MOUTH EVERY 8 HOURS AS NEEDED  ?  ? Analgesics:  NSAIDS Failed - 03/17/2022  9:08 AM  ?  ?  Failed - Manual Review: Labs are only required if the patient has taken medication for more than 8 weeks.  ?  ?  Failed - HGB in normal range and within 360 days  ?  Hemoglobin  ?Date Value Ref Range Status  ?12/18/2021 7.2 (L) 12.0 - 15.0 g/dL Final  ?  Comment:  ?  Reticulocyte Hemoglobin testing ?may be clinically indicated, ?consider ordering this additional ?test [id number] ?  ?12/09/2021 7.6 (L) 11.1 - 15.9 g/dL Final  ?  ?  ?  ?  Failed - HCT in normal range and within 360 days  ?  HCT  ?Date Value Ref Range Status  ?12/18/2021 25.0 (L) 36.0 - 46.0 % Final  ? ?Hematocrit  ?Date Value Ref Range Status  ?12/09/2021 27.3 (L) 34.0 - 46.6 % Final  ?  ?  ?  ?  Passed - Cr in normal range and within 360 days  ?  Creatinine, Ser  ?Date Value Ref Range Status  ?12/18/2021 0.81 0.44 - 1.00 mg/dL Final  ?  ?  ?  ?  Passed - PLT in normal range and within 360 days  ?  Platelets  ?Date Value Ref Range Status  ?12/18/2021 225 150 - 400 K/uL Final  ?  Comment:  ?  REPEATED TO VERIFY  ?12/09/2021 217 150 - 450 x10E3/uL Final  ?  ?  ?  ?  Passed - eGFR is 30 or above and within 360 days  ?  GFR, Estimated  ?Date Value Ref Range Status  ?12/18/2021 >60 >60 mL/min Final  ?  Comment:  ?  (NOTE) ?Calculated using the CKD-EPI Creatinine Equation (2021) ?  ? ?eGFR  ?Date Value Ref Range Status  ?12/09/2021 94 >59 mL/min/1.73 Final  ?  ?  ?  ?  Passed - Patient is not pregnant  ?  ?  Passed - Valid encounter within last 12 months  ?   Recent Outpatient Visits   ? ?      ? Yesterday Essential (primary) hypertension  ? Primary Care at [facility], Amy J, NP  ? 1 month ago Encounter to establish care  ? Primary Care at [facility], [place], NP  ? ?  ?  ?Future Appointments   ? ?        ? In 1 week Camillia Herter, NP Primary Care at [facility]  ? In 4 weeks Camillia Herter, NP Primary Care at [facility]  ? ?  ? ? ?  ?  ?  ? ? ? ?Requested Prescriptions  ?Pending Prescriptions Disp Refills  ? ibuprofen (ADVIL) 800 MG tablet [Pharmacy Med Name: IBUPROFEN 800 MG TABLET] 30 tablet 0  ?  Sig: TAKE 1 TABLET BY MOUTH EVERY 8 HOURS AS NEEDED  ?  ? Analgesics:  NSAIDS Failed - 03/17/2022  9:08 AM  ?  ?  Failed - Manual Review: Labs are only  required if the patient has taken medication for more than 8 weeks.  ?  ?  Failed - HGB in normal range and within 360 days  ?  Hemoglobin  ?Date Value Ref Range Status  ?12/18/2021 7.2 (L) 12.0 - 15.0 g/dL Final  ?  Comment:  ?  Reticulocyte Hemoglobin testing ?may be clinically indicated, ?consider ordering this additional ?test [id number] ?  ?12/09/2021 7.6 (L) 11.1 - 15.9 g/dL Final  ?  ?  ?  ?  Failed - HCT in normal range and within 360 days  ?  HCT  ?Date Value Ref Range Status  ?12/18/2021 25.0 (L) 36.0 - 46.0 % Final  ? ?Hematocrit  ?Date Value Ref Range Status  ?12/09/2021 27.3 (L) 34.0 - 46.6 % Final  ?  ?  ?  ?  Passed - Cr in normal range and within 360 days  ?  Creatinine, Ser  ?Date Value Ref Range Status  ?12/18/2021 0.81 0.44 - 1.00 mg/dL Final  ?  ?  ?  ?  Passed - PLT in normal range and within 360 days  ?  Platelets  ?Date Value Ref Range Status  ?12/18/2021 225 150 - 400 K/uL Final  ?  Comment:  ?  REPEATED TO VERIFY  ?12/09/2021 217 150 - 450 x10E3/uL Final  ?  ?  ?  ?  Passed - eGFR is 30 or above and within 360 days  ?  GFR, Estimated  ?Date Value Ref Range Status  ?12/18/2021 >60 >60 mL/min Final  ?  Comment:  ?  (NOTE) ?Calculated using the CKD-EPI Creatinine Equation  (2021) ?  ? ?eGFR  ?Date Value Ref Range Status  ?12/09/2021 94 >59 mL/min/1.73 Final  ?  ?  ?  ?  Passed - Patient is not pregnant  ?  ?  Passed - Valid encounter within last 12 months  ?  Recent Outpatient Visits   ? ?      ? Yesterday Essential (primary) hypertension  ? Primary Care at [facility], Amy J, NP  ? 1 month ago Encounter to establish care  ? Primary Care at [facility], [place], NP  ? ?  ?  ?Future Appointments   ? ?        ? In 1 week Camillia Herter, NP Primary Care at [facility]  ? In 4 weeks Camillia Herter, NP Primary Care at [facility]  ? ?  ? ? ?  ?  ?  ? ? ? ?

## 2022-03-24 NOTE — Progress Notes (Signed)
Erroneous encounter

## 2022-03-28 ENCOUNTER — Encounter: Payer: Self-pay | Admitting: Family

## 2022-03-28 DIAGNOSIS — Z1211 Encounter for screening for malignant neoplasm of colon: Secondary | ICD-10-CM

## 2022-03-28 DIAGNOSIS — Z131 Encounter for screening for diabetes mellitus: Secondary | ICD-10-CM

## 2022-03-28 DIAGNOSIS — Z13220 Encounter for screening for lipoid disorders: Secondary | ICD-10-CM

## 2022-03-28 DIAGNOSIS — Z1159 Encounter for screening for other viral diseases: Secondary | ICD-10-CM

## 2022-03-28 DIAGNOSIS — Z0000 Encounter for general adult medical examination without abnormal findings: Secondary | ICD-10-CM

## 2022-03-28 DIAGNOSIS — Z1231 Encounter for screening mammogram for malignant neoplasm of breast: Secondary | ICD-10-CM

## 2022-03-28 DIAGNOSIS — Z13228 Encounter for screening for other metabolic disorders: Secondary | ICD-10-CM

## 2022-03-28 DIAGNOSIS — Z113 Encounter for screening for infections with a predominantly sexual mode of transmission: Secondary | ICD-10-CM

## 2022-03-28 DIAGNOSIS — Z124 Encounter for screening for malignant neoplasm of cervix: Secondary | ICD-10-CM

## 2022-04-08 NOTE — Progress Notes (Signed)
Patient ID: Ashley Orr, female    DOB: 10/22/1971  MRN: [id number]  CC: Weight Check  Subjective: Ashley Orr is a 51 y.o. female who presents for weight check.   Her concerns today include:  Weight check: 03/16/2022: - Increase Phentermine from 15 mg daily to 30 mg daily.   04/14/2022: Doing well on current regimen, no issues/concerns. Has noticed some fluctuations of weight recently.   2. Hypothyroidism follow-up: Has not heard from Endocrinology referral.   Patient Active Problem List   Diagnosis Date Noted   Abnormal uterine bleeding 04/13/2022   Cellulitis of face 12/17/2021   Iron deficiency anemia 01/13/2021   Smoker 01/12/2021   MDD (major depressive disorder), recurrent episode, moderate ([place]) 01/12/2021   Anemia 01/12/2021   Insomnia 01/12/2021   Stress incontinence 01/12/2021   Health care maintenance 01/01/2020   Essential hypertension 12/29/2019   Acquired hypothyroidism 12/29/2019     Current Outpatient Medications on File Prior to Visit  Medication Sig Dispense Refill   buPROPion (WELLBUTRIN SR) 150 MG 12 hr tablet TAKE 1 TABLET BY MOUTH EVERY DAY 90 tablet 0   Cholecalciferol 50 MCG (2000 UT) CAPS Take 2,000 Units by mouth daily.     citalopram (CELEXA) 40 MG tablet TAKE 1 TABLET BY MOUTH EVERY DAY 90 tablet 0   cyclobenzaprine (FLEXERIL) 10 MG tablet Take 1 tablet (10 mg total) by mouth 3 (three) times daily as needed for muscle spasms. 30 tablet 2   ferrous sulfate 325 (65 FE) MG tablet Take 325 mg by mouth daily with breakfast.     hydrochlorothiazide (HYDRODIURIL) 25 MG tablet Take 1 tablet (25 mg total) by mouth daily. 90 tablet 0   ibuprofen (ADVIL) 800 MG tablet TAKE 1 TABLET BY MOUTH EVERY 8 HOURS AS NEEDED 30 tablet 2   levothyroxine (SYNTHROID) 175 MCG tablet Take 1 tablet (175 mcg total) by mouth See admin instructions. Alternating with 200 mcg every other day 90 tablet 0   levothyroxine (SYNTHROID) 200 MCG tablet Take 1  tablet (200 mcg total) by mouth See admin instructions. Alternating with 175 mcg every other day. 90 tablet 0   losartan (COZAAR) 50 MG tablet Take 1 tablet (50 mg total) by mouth daily. 90 tablet 0   naproxen (NAPROSYN) 500 MG tablet Take 500 mg by mouth 2 (two) times daily.     norethindrone (AYGESTIN) 5 MG tablet Take 1 tablet (5 mg total) by mouth daily. 90 tablet 3   oxybutynin (DITROPAN-XL) 5 MG 24 hr tablet Take 1 tablet (5 mg total) by mouth every evening. 90 tablet 0   traZODone (DESYREL) 100 MG tablet Take 1 tablet (100 mg total) by mouth at bedtime. 90 tablet 0   vitamin C (ASCORBIC ACID) 500 MG tablet Take 500 mg by mouth daily.     No current facility-administered medications on file prior to visit.    Allergies  Allergen Reactions   Codeine Anaphylaxis    Social History   Socioeconomic History   Marital status: Single    Spouse name: Not on file   Number of children: Not on file   Years of education: Not on file   Highest education level: Not on file  Occupational History   Not on file  Tobacco Use   Smoking status: Every Day    Packs/day: 1.00    Types: Cigarettes   Smokeless tobacco: Never  Substance and Sexual Activity   Alcohol use: Not Currently   Drug use: Not Currently  Sexual activity: Yes    Birth control/protection: Surgical  Other Topics Concern   Not on file  Social History Narrative   Not on file   Social Determinants of Health   Financial Resource Strain: Not on file  Food Insecurity: Not on file  Transportation Needs: Not on file  Physical Activity: Not on file  Stress: Not on file  Social Connections: Not on file  Intimate Partner Violence: Not on file    Family History  Problem Relation Age of Onset   Stroke Mother    COPD Mother    Hypertension Mother    Heart disease Father    Diabetes Father    Hypertension Father    Hypertension Brother    Diabetes Brother     Past Surgical History:  Procedure Laterality Date    APPENDECTOMY     CESAREAN SECTION     TUBAL LIGATION      ROS: Review of Systems Negative except as stated above  PHYSICAL EXAM: BP 137/75 (BP Location: Left Arm, Patient Position: Sitting, Cuff Size: Large)   Pulse 86   Temp 98.3 F (36.8 C)   Resp 18   Ht 5' 2.01" (1.575 m)   Wt 210 lb (95.3 kg)   LMP  (LMP Unknown)   SpO2 98%   BMI 38.40 kg/m   Physical Exam HENT:     Head: Normocephalic and atraumatic.  Eyes:     Extraocular Movements: Extraocular movements intact.     Conjunctiva/sclera: Conjunctivae normal.     Pupils: Pupils are equal, round, and reactive to light.  Cardiovascular:     Rate and Rhythm: Normal rate and regular rhythm.     Pulses: Normal pulses.     Heart sounds: Normal heart sounds.  Pulmonary:     Effort: Pulmonary effort is normal.     Breath sounds: Normal breath sounds.  Musculoskeletal:     Cervical back: Normal range of motion and neck supple.  Neurological:     General: No focal deficit present.     Mental Status: She is alert and oriented to person, place, and time.  Psychiatric:        Mood and Affect: Mood normal.        Behavior: Behavior normal.   ASSESSMENT AND PLAN: 1. Encounter for weight management: - Increase Phentermine from 30 mg daily to 37.5 mg daily.  - Patient will be on Phentermine holiday for 3 months. Discussed will consider restarting around September 2023.  - phentermine 37.5 MG capsule; Take 1 capsule (37.5 mg total) by mouth every morning.  Dispense: 30 capsule; Refill: 0  2. Hypothyroidism (acquired): - New referral placed to Endocrinology. I have reached out to referral coordinator to see if we can get referral expedited.  - Ambulatory referral to Endocrinology    Patient was given the opportunity to ask questions.  Patient verbalized understanding of the plan and was able to repeat key elements of the plan. Patient was given clear instructions to go to Emergency Department or return to medical center if  symptoms don't improve, worsen, or new problems develop.The patient verbalized understanding.   Orders Placed This Encounter  Procedures   Ambulatory referral to Endocrinology    Requested Prescriptions   Signed Prescriptions Disp Refills   phentermine 37.5 MG capsule 30 capsule 0    Sig: Take 1 capsule (37.5 mg total) by mouth every morning.    Follow-up with primary provider as scheduled.  Camillia Herter, NP

## 2022-04-13 ENCOUNTER — Encounter: Payer: Self-pay | Admitting: Family Medicine

## 2022-04-13 ENCOUNTER — Ambulatory Visit (INDEPENDENT_AMBULATORY_CARE_PROVIDER_SITE_OTHER): Payer: 59 | Admitting: Family Medicine

## 2022-04-13 VITALS — BP 159/109 | HR 93 | Wt 211.7 lb

## 2022-04-13 DIAGNOSIS — Z1211 Encounter for screening for malignant neoplasm of colon: Secondary | ICD-10-CM | POA: Diagnosis not present

## 2022-04-13 DIAGNOSIS — N938 Other specified abnormal uterine and vaginal bleeding: Secondary | ICD-10-CM | POA: Insufficient documentation

## 2022-04-13 DIAGNOSIS — I10 Essential (primary) hypertension: Secondary | ICD-10-CM

## 2022-04-13 DIAGNOSIS — N939 Abnormal uterine and vaginal bleeding, unspecified: Secondary | ICD-10-CM | POA: Diagnosis not present

## 2022-04-13 DIAGNOSIS — D500 Iron deficiency anemia secondary to blood loss (chronic): Secondary | ICD-10-CM | POA: Diagnosis not present

## 2022-04-13 DIAGNOSIS — F17200 Nicotine dependence, unspecified, uncomplicated: Secondary | ICD-10-CM

## 2022-04-13 DIAGNOSIS — R69 Illness, unspecified: Secondary | ICD-10-CM | POA: Diagnosis not present

## 2022-04-13 DIAGNOSIS — E039 Hypothyroidism, unspecified: Secondary | ICD-10-CM | POA: Diagnosis not present

## 2022-04-13 DIAGNOSIS — Z1231 Encounter for screening mammogram for malignant neoplasm of breast: Secondary | ICD-10-CM

## 2022-04-13 MED ORDER — NORETHINDRONE ACETATE 5 MG PO TABS: 5.0000 mg | ORAL_TABLET | Freq: Every day | ORAL | 3 refills | Status: DC

## 2022-04-13 NOTE — Assessment & Plan Note (Signed)
Consider smoking cessation prior to surgery.

## 2022-04-13 NOTE — Progress Notes (Signed)
Subjective:    Patient ID: Ashley Orr is a 51 y.o. female presenting with Fibroids  on 04/13/2022  HPI: Having pain, feels tired all the time and bleeding constantly.Cyles last 7-21 days. They are heavy, has bleeding of 1 pad/hour. Lots of clots. No signs of menopause yet. Cycles are monthly. Has h/o prior c-section x 3 with vertical midline skin incisions. Reports prior w/u in [place] with EMB 2 years ago. S/p BTL. Wants to preserve uterus.  Review of Systems  Constitutional:  Negative for chills and fever.  Respiratory:  Negative for shortness of breath.   Cardiovascular:  Negative for chest pain.  Gastrointestinal:  Negative for abdominal pain, nausea and vomiting.  Genitourinary:  Positive for vaginal bleeding. Negative for dysuria.  Skin:  Negative for rash.     Objective:    BP (!) 159/109   Pulse 93   Wt 211 lb 11.2 oz (96 kg)   LMP  (LMP Unknown)   BMI 38.72 kg/m  Physical Exam Exam conducted with a chaperone present.  Constitutional:      General: She is not in acute distress.    Appearance: She is well-developed.  HENT:     Head: Normocephalic and atraumatic.  Eyes:     General: No scleral icterus. Cardiovascular:     Rate and Rhythm: Normal rate.  Pulmonary:     Effort: Pulmonary effort is normal.  Abdominal:     Palpations: Abdomen is soft.  Musculoskeletal:     Cervical back: Neck supple.  Skin:    General: Skin is warm and dry.  Neurological:     Mental Status: She is alert and oriented to person, place, and time.   U/s with [facility] 07/2021 6 x 6 x 11 cm with fibroid measuring 4.1 cm Stripe is 19m.     Assessment & Plan:   Problem List Items Addressed This Visit       Unprioritized   Essential hypertension    Seeing PCP on tomorrow--will need improved control prior to surgery       Acquired hypothyroidism    Last TSH was 24--pt. Reports no changes to meds with that--need improved thyroid funciton--likely to help her bleeding  as well.       Iron deficiency anemia    Consider IV iron infusion priorl to surgery--check level today       Smoker    Consider smoking cessation prior to surgery.       Abnormal uterine bleeding - Primary    Has 3 prior Cesarean deliveries and fibroid and not well controlled thyroid disease. Likely has some adenomyosis. Needs endometrial sampling and pap smear--declined today--but will bring her back for this. We will repeat labs, check u/s, EMB--control thyroid and begin Aygestin. Discussed all treatment options with the patient and after careful consideration, she would want endometrial ablation. Declines oral meds and IUD. Declines hysterectomy.        Relevant Medications   norethindrone (AYGESTIN) 5 MG tablet   Other Relevant Orders   TSH   Follicle stimulating hormone   CBC   UKoreaPELVIC COMPLETE WITH TRANSVAGINAL   Other Visit Diagnoses     Screen for colon cancer       referral for colonoscopy   Relevant Orders   Ambulatory referral to Gastroenterology   Encounter for screening mammogram for malignant neoplasm of breast       health maintenance gaps closed   Relevant Orders   MM 3D SCREEN BREAST BILATERAL  Return in about 3 weeks (around 05/04/2022) for pap and endometrial biopsy.  [name], MD 04/13/2022 2:18 PM

## 2022-04-13 NOTE — Assessment & Plan Note (Signed)
Has 3 prior Cesarean deliveries and fibroid and not well controlled thyroid disease. Likely has some adenomyosis. Needs endometrial sampling and pap smear--declined today--but will bring her back for this. We will repeat labs, check u/s, EMB--control thyroid and begin Aygestin. Discussed all treatment options with the patient and after careful consideration, she would want endometrial ablation. Declines oral meds and IUD. Declines hysterectomy.

## 2022-04-13 NOTE — Assessment & Plan Note (Signed)
Last TSH was 24--pt. Reports no changes to meds with that--need improved thyroid funciton--likely to help her bleeding as well.

## 2022-04-13 NOTE — Assessment & Plan Note (Signed)
Seeing PCP on tomorrow--will need improved control prior to surgery

## 2022-04-13 NOTE — Assessment & Plan Note (Signed)
Consider IV iron infusion priorl to surgery--check level today

## 2022-04-14 ENCOUNTER — Encounter: Payer: Self-pay | Admitting: Family

## 2022-04-14 ENCOUNTER — Telehealth: Payer: Self-pay | Admitting: Family

## 2022-04-14 ENCOUNTER — Ambulatory Visit (INDEPENDENT_AMBULATORY_CARE_PROVIDER_SITE_OTHER): Payer: 59 | Admitting: Family

## 2022-04-14 VITALS — BP 137/75 | HR 86 | Temp 98.3°F | Resp 18 | Ht 62.01 in | Wt 210.0 lb

## 2022-04-14 DIAGNOSIS — Z7689 Persons encountering health services in other specified circumstances: Secondary | ICD-10-CM | POA: Diagnosis not present

## 2022-04-14 DIAGNOSIS — E039 Hypothyroidism, unspecified: Secondary | ICD-10-CM | POA: Diagnosis not present

## 2022-04-14 LAB — CBC
Hematocrit: 27.5 % — ABNORMAL LOW (ref 34.0–46.6)
Hemoglobin: 7.9 g/dL — ABNORMAL LOW (ref 11.1–15.9)
MCH: 19.9 pg — ABNORMAL LOW (ref 26.6–33.0)
MCHC: 28.7 g/dL — ABNORMAL LOW (ref 31.5–35.7)
MCV: 69 fL — ABNORMAL LOW (ref 79–97)
Platelets: 327 10*3/uL (ref 150–450)
RBC: 3.97 x10E6/uL (ref 3.77–5.28)
RDW: 20.3 % — ABNORMAL HIGH (ref 11.7–15.4)
WBC: 6.7 10*3/uL (ref 3.4–10.8)

## 2022-04-14 LAB — FOLLICLE STIMULATING HORMONE: FSH: 11.9 m[IU]/mL

## 2022-04-14 LAB — TSH: TSH: 12.7 u[IU]/mL — ABNORMAL HIGH (ref 0.450–4.500)

## 2022-04-14 MED ORDER — PHENTERMINE HCL 37.5 MG PO CAPS
37.5000 mg | ORAL_CAPSULE | ORAL | 0 refills | Status: DC
Start: 2022-04-14 — End: 2022-12-01

## 2022-04-14 NOTE — Progress Notes (Signed)
Pt presents for weight check

## 2022-04-26 ENCOUNTER — Ambulatory Visit: Admission: RE | Admit: 2022-04-26 | Payer: 59 | Source: Ambulatory Visit

## 2022-04-26 ENCOUNTER — Encounter: Payer: Self-pay | Admitting: Family

## 2022-04-26 NOTE — Telephone Encounter (Signed)
Thank you :)

## 2022-05-02 ENCOUNTER — Ambulatory Visit
Admission: RE | Admit: 2022-05-02 | Discharge: 2022-05-02 | Disposition: A | Discharge status: Home / Self Care | Payer: 59 | Source: Ambulatory Visit | Attending: Family Medicine | Admitting: Family Medicine

## 2022-05-02 DIAGNOSIS — Z1231 Encounter for screening mammogram for malignant neoplasm of breast: Secondary | ICD-10-CM

## 2022-05-03 ENCOUNTER — Other Ambulatory Visit: Payer: 59

## 2022-05-22 ENCOUNTER — Encounter: Payer: Self-pay | Admitting: Family Medicine

## 2022-05-22 ENCOUNTER — Other Ambulatory Visit (HOSPITAL_COMMUNITY)
Admission: RE | Admit: 2022-05-22 | Discharge: 2022-05-22 | Disposition: A | Discharge status: Home / Self Care | Payer: 59 | Source: Ambulatory Visit | Attending: Family Medicine | Admitting: Family Medicine

## 2022-05-22 ENCOUNTER — Ambulatory Visit (INDEPENDENT_AMBULATORY_CARE_PROVIDER_SITE_OTHER): Payer: 59 | Admitting: Family Medicine

## 2022-05-22 VITALS — BP 129/87 | HR 109 | Wt 210.7 lb

## 2022-05-22 DIAGNOSIS — Z124 Encounter for screening for malignant neoplasm of cervix: Secondary | ICD-10-CM | POA: Insufficient documentation

## 2022-05-22 DIAGNOSIS — N939 Abnormal uterine and vaginal bleeding, unspecified: Secondary | ICD-10-CM | POA: Diagnosis not present

## 2022-05-22 DIAGNOSIS — Z3202 Encounter for pregnancy test, result negative: Secondary | ICD-10-CM

## 2022-05-22 DIAGNOSIS — D500 Iron deficiency anemia secondary to blood loss (chronic): Secondary | ICD-10-CM

## 2022-05-22 DIAGNOSIS — N858 Other specified noninflammatory disorders of uterus: Secondary | ICD-10-CM | POA: Diagnosis not present

## 2022-05-22 LAB — POCT PREGNANCY, URINE: Preg Test, Ur: NEGATIVE

## 2022-05-22 NOTE — Progress Notes (Signed)
   Subjective:    Patient ID: Ashley Orr is a 51 y.o. female presenting with Procedure  on 05/22/2022  HPI: On Aygestin and notes that her cycles return if she does  not take it.  Review of Systems  Constitutional:  Negative for chills and fever.  Respiratory:  Negative for shortness of breath.   Cardiovascular:  Negative for chest pain.  Gastrointestinal:  Negative for abdominal pain, nausea and vomiting.  Genitourinary:  Negative for dysuria.  Skin:  Negative for rash.      Objective:    BP 129/87   Pulse (!) 109   Wt 210 lb 11.2 oz (95.6 kg)   SpO2 100%   BMI 38.53 kg/m  Physical Exam Exam conducted with a chaperone present.  Constitutional:      General: She is not in acute distress.    Appearance: She is well-developed.  HENT:     Head: Normocephalic and atraumatic.  Eyes:     General: No scleral icterus. Cardiovascular:     Rate and Rhythm: Normal rate.  Pulmonary:     Effort: Pulmonary effort is normal.  Abdominal:     Palpations: Abdomen is soft.  Genitourinary:    Comments: BUS normal, vagina is pink and rugated, cervix is nulliparous without lesion, uterus is small and anteverted, no adnexal mass or tenderness.  Musculoskeletal:     Cervical back: Neck supple.  Skin:    General: Skin is warm and dry.  Neurological:     Mental Status: She is alert and oriented to person, place, and time.    Procedure: Patient given informed consent, signed copy in the chart, time out was performed. Appropriate time out taken. . The patient was placed in the lithotomy position and the cervix brought into view with sterile speculum.  Portio of cervix cleansed x 2 with betadine swabs.  A tenaculum was placed in the anterior lip of the cervix.  The uterus was sounded for depth of 7 cm. A pipelle was introduced to into the uterus, suction created,  and an endometrial sample was obtained. All equipment was removed and accounted for.  The patient tolerated the  procedure well.       Assessment & Plan:   Problem List Items Addressed This Visit       Unprioritized   Iron deficiency anemia    Repeat CBC--next week      Abnormal uterine bleeding - Primary    Pap + EMB today--continue Aygestin. For HTA next week.      Relevant Orders   Surgical pathology( [place]/ [facility])   Other Visit Diagnoses     Papanicolaou smear for cervical cancer screening       Relevant Orders   Cytology - PAP( [place])        No follow-ups on file.  [name], MD 05/22/2022 1:50 PM

## 2022-05-24 ENCOUNTER — Encounter: Payer: Self-pay | Admitting: Family Medicine

## 2022-05-24 LAB — SURGICAL PATHOLOGY

## 2022-05-24 NOTE — Assessment & Plan Note (Addendum)
Repeat CBC next week 

## 2022-05-24 NOTE — Assessment & Plan Note (Signed)
Pap + EMB today--continue Aygestin. For HTA next week.

## 2022-05-25 ENCOUNTER — Telehealth: Payer: Self-pay

## 2022-05-25 LAB — CYTOLOGY - PAP
Adequacy: ABSENT
Chlamydia: NEGATIVE
Comment: NEGATIVE
Comment: NEGATIVE
Comment: NEGATIVE
Comment: NORMAL
High risk HPV: NEGATIVE
Neisseria Gonorrhea: NEGATIVE
Trichomonas: NEGATIVE

## 2022-05-25 NOTE — Telephone Encounter (Signed)
Pt notified of results and to schedule in one year for pap smear.  Pt verbalized understanding.   Frances Nickels  05/25/22

## 2022-05-25 NOTE — Telephone Encounter (Addendum)
-----   Message from [name], MD sent at 05/25/2022  7:48 AM EDT ----- 1 year pap recall  Left message for pt that I am calling with non urgent results please give [name] a call at the office or respond to [name] message.  MyChart message sent.    Ashley Orr  05/25/22

## 2022-05-26 ENCOUNTER — Encounter (HOSPITAL_COMMUNITY): Payer: Self-pay | Admitting: Family Medicine

## 2022-05-29 ENCOUNTER — Other Ambulatory Visit: Payer: Self-pay

## 2022-05-29 ENCOUNTER — Encounter (HOSPITAL_COMMUNITY): Payer: Self-pay | Admitting: Family Medicine

## 2022-05-29 NOTE — Pre-Procedure Instructions (Signed)
[id number] [id number]:: [phone]:: [phone]  PCP - ADurene Fruits NP  EKG - [place]ublin- Clears until 1230  Anesthesia review: N  Patient verbally denies any shortness of breath, fever, cough and chest pain during phone call   -------------  SDW INSTRUCTIONS given:  Your procedure is scheduled on 05/30/22.  [facility] at 1:00 P.M., and check in at the Admitting office.  Call this number if you have problems the morning of surgery:  [phone]   Remember:  Do not eat after midnight the night before your surgery  You may drink clear liquids until 1230 the afternoon of your surgery.   Clear liquids allowed are: Water, Non-Citrus Juices (without pulp), Carbonated Beverages, Clear Tea, Black Coffee Only, and Gatorade    Take these medicines the morning of surgery with A SIP OF WATER  buPROPion (WELLBUTRIN SR)  citalopram (CELEXA)  levothyroxine (SYNTHROID) Venlafaxine  cyclobenzaprine (FLEXERIL)-if needed  As of today, STOP taking any phentermine, Aleve, Naproxen, Ibuprofen, Motrin, Advil, Goody's, BC's, all herbal medications, fish oil, and all vitamins.                      Do not wear jewelry, make up, or nail polish            Do not wear lotions, powders, perfumes/colognes, or deodorant.            Do not shave 48 hours prior to surgery.  Men may shave face and neck.            Do not bring valuables to the hospital.            [facility] or [facility].  Do NOT Smoke (Tobacco/Vaping) 24 hours prior to your procedure If you use a CPAP at night, you may bring all equipment for your overnight stay.   Contacts, glasses, dentures or bridgework may not be worn into surgery.      For patients admitted to the hospital, discharge time will be determined by your treatment team.   Patients discharged the day of  surgery will not be allowed to drive home, and someone needs to stay with them for 24 hours.    Special instructions:   [place]- Preparing For Surgery  Before surgery, you can play an important role. Because skin is not sterile, your skin needs to be as free of germs as possible. You can reduce the number of germs on your skin by washing with CHG (chlorahexidine gluconate) Soap before surgery.  CHG is an antiseptic cleaner which kills germs and bonds with the skin to continue killing germs even after washing.    Oral Hygiene is also important to reduce your risk of infection.  Remember - BRUSH YOUR TEETH THE MORNING OF SURGERY WITH YOUR REGULAR TOOTHPASTE  Please do not use if you have an allergy to CHG or antibacterial soaps. If your skin becomes reddened/irritated stop using the CHG.  Do not shave (including legs and underarms) for at least 48 hours prior to first CHG shower. It is OK to shave your face.  Please follow these instructions carefully.   Shower the NIGHT BEFORE SURGERY and the MORNING OF SURGERY with DIAL Soap.   Pat yourself dry with a CLEAN TOWEL.  Wear CLEAN PAJAMAS to bed the night before surgery  Place CLEAN  SHEETS on your bed the night of your first shower and DO NOT SLEEP WITH PETS.   Day of Surgery: Please shower morning of surgery  Wear Clean/Comfortable clothing the morning of surgery Do not apply any deodorants/lotions.   Remember to brush your teeth WITH YOUR REGULAR TOOTHPASTE.   Questions were answered. Patient verbalized understanding of instructions.

## 2022-05-30 ENCOUNTER — Ambulatory Visit (HOSPITAL_COMMUNITY)
Admission: RE | Admit: 2022-05-30 | Discharge: 2022-05-30 | Disposition: A | Discharge status: Home / Self Care | Payer: 59 | Attending: Family Medicine | Admitting: Family Medicine

## 2022-05-30 ENCOUNTER — Other Ambulatory Visit: Payer: Self-pay

## 2022-05-30 ENCOUNTER — Encounter (HOSPITAL_COMMUNITY)
Admission: RE | Disposition: A | Discharge status: Home / Self Care | Payer: Self-pay | Source: Home / Self Care | Attending: Family Medicine

## 2022-05-30 ENCOUNTER — Ambulatory Visit (HOSPITAL_BASED_OUTPATIENT_CLINIC_OR_DEPARTMENT_OTHER): Payer: 59

## 2022-05-30 ENCOUNTER — Encounter (HOSPITAL_COMMUNITY): Payer: Self-pay | Admitting: Family Medicine

## 2022-05-30 ENCOUNTER — Ambulatory Visit (HOSPITAL_COMMUNITY): Payer: 59

## 2022-05-30 DIAGNOSIS — Z98891 History of uterine scar from previous surgery: Secondary | ICD-10-CM | POA: Diagnosis not present

## 2022-05-30 DIAGNOSIS — F17210 Nicotine dependence, cigarettes, uncomplicated: Secondary | ICD-10-CM | POA: Diagnosis not present

## 2022-05-30 DIAGNOSIS — F32A Depression, unspecified: Secondary | ICD-10-CM | POA: Insufficient documentation

## 2022-05-30 DIAGNOSIS — N920 Excessive and frequent menstruation with regular cycle: Secondary | ICD-10-CM

## 2022-05-30 DIAGNOSIS — R69 Illness, unspecified: Secondary | ICD-10-CM | POA: Diagnosis not present

## 2022-05-30 DIAGNOSIS — E669 Obesity, unspecified: Secondary | ICD-10-CM | POA: Diagnosis not present

## 2022-05-30 DIAGNOSIS — D250 Submucous leiomyoma of uterus: Secondary | ICD-10-CM | POA: Insufficient documentation

## 2022-05-30 DIAGNOSIS — Z6838 Body mass index (BMI) 38.0-38.9, adult: Secondary | ICD-10-CM | POA: Insufficient documentation

## 2022-05-30 DIAGNOSIS — N939 Abnormal uterine and vaginal bleeding, unspecified: Secondary | ICD-10-CM

## 2022-05-30 DIAGNOSIS — E039 Hypothyroidism, unspecified: Secondary | ICD-10-CM | POA: Insufficient documentation

## 2022-05-30 DIAGNOSIS — I10 Essential (primary) hypertension: Secondary | ICD-10-CM | POA: Diagnosis not present

## 2022-05-30 DIAGNOSIS — D500 Iron deficiency anemia secondary to blood loss (chronic): Secondary | ICD-10-CM

## 2022-05-30 DIAGNOSIS — N938 Other specified abnormal uterine and vaginal bleeding: Secondary | ICD-10-CM | POA: Diagnosis present

## 2022-05-30 DIAGNOSIS — N840 Polyp of corpus uteri: Secondary | ICD-10-CM | POA: Diagnosis not present

## 2022-05-30 HISTORY — PX: DILITATION & CURRETTAGE/HYSTROSCOPY WITH HYDROTHERMAL ABLATION: SHX5570

## 2022-05-30 HISTORY — DX: Major depressive disorder, single episode, unspecified: F32.9

## 2022-05-30 HISTORY — DX: Restless legs syndrome: G25.81

## 2022-05-30 HISTORY — PX: DILATATION & CURETTAGE/HYSTEROSCOPY WITH MYOSURE: SHX6511

## 2022-05-30 HISTORY — DX: Anemia, unspecified: D64.9

## 2022-05-30 LAB — SURGICAL PCR SCREEN
MRSA, PCR: NEGATIVE
Staphylococcus aureus: NEGATIVE

## 2022-05-30 LAB — CBC
HCT: 28.2 % — ABNORMAL LOW (ref 36.0–46.0)
Hemoglobin: 7.8 g/dL — ABNORMAL LOW (ref 12.0–15.0)
MCH: 19.4 pg — ABNORMAL LOW (ref 26.0–34.0)
MCHC: 27.7 g/dL — ABNORMAL LOW (ref 30.0–36.0)
MCV: 70.1 fL — ABNORMAL LOW (ref 80.0–100.0)
Platelets: ADEQUATE 10*3/uL (ref 150–400)
RBC: 4.02 MIL/uL (ref 3.87–5.11)
RDW: 21.9 % — ABNORMAL HIGH (ref 11.5–15.5)
WBC: 7 10*3/uL (ref 4.0–10.5)
nRBC: 0 % (ref 0.0–0.2)

## 2022-05-30 LAB — BASIC METABOLIC PANEL
Anion gap: 14 (ref 5–15)
BUN: 10 mg/dL (ref 6–20)
CO2: 17 mmol/L — ABNORMAL LOW (ref 22–32)
Calcium: 9.4 mg/dL (ref 8.9–10.3)
Chloride: 104 mmol/L (ref 98–111)
Creatinine, Ser: 1.15 mg/dL — ABNORMAL HIGH (ref 0.44–1.00)
GFR, Estimated: 57 mL/min — ABNORMAL LOW (ref >60)
Glucose, Bld: 101 mg/dL — ABNORMAL HIGH (ref 70–99)
Potassium: 3.3 mmol/L — ABNORMAL LOW (ref 3.5–5.1)
Sodium: 135 mmol/L (ref 135–145)

## 2022-05-30 LAB — POCT PREGNANCY, URINE: Preg Test, Ur: NEGATIVE

## 2022-05-30 LAB — NO BLOOD PRODUCTS

## 2022-05-30 SURGERY — DILATATION & CURETTAGE/HYSTEROSCOPY WITH HYDROTHERMAL ABLATION
Anesthesia: General | Site: Uterus

## 2022-05-30 MED ORDER — SCOPOLAMINE 1 MG/3DAYS TD PT72
MEDICATED_PATCH | TRANSDERMAL | Status: AC
  Filled 2022-05-30: qty 1

## 2022-05-30 MED ORDER — SCOPOLAMINE 1 MG/3DAYS TD PT72
MEDICATED_PATCH | TRANSDERMAL | Status: DC | PRN
  Administered 2022-05-30: 1 via TRANSDERMAL

## 2022-05-30 MED ORDER — LACTATED RINGERS IV SOLN: INTRAVENOUS | Status: DC

## 2022-05-30 MED ORDER — ONDANSETRON HCL 4 MG/2ML IJ SOLN
INTRAMUSCULAR | Status: DC | PRN
  Administered 2022-05-30 (×2): 4 mg via INTRAVENOUS

## 2022-05-30 MED ORDER — PROMETHAZINE HCL 25 MG/ML IJ SOLN: 6.2500 mg | INTRAMUSCULAR | Status: DC | PRN

## 2022-05-30 MED ORDER — DIPHENHYDRAMINE HCL 50 MG/ML IJ SOLN
INTRAMUSCULAR | Status: DC | PRN
  Administered 2022-05-30: 12.5 mg via INTRAVENOUS

## 2022-05-30 MED ORDER — SODIUM CHLORIDE 0.9 % IR SOLN
Status: DC | PRN
  Administered 2022-05-30 (×2): 3000 mL

## 2022-05-30 MED ORDER — MIDAZOLAM HCL 2 MG/2ML IJ SOLN
INTRAMUSCULAR | Status: DC | PRN
  Administered 2022-05-30: 2 mg via INTRAVENOUS

## 2022-05-30 MED ORDER — LIDOCAINE 2% (20 MG/ML) 5 ML SYRINGE
INTRAMUSCULAR | Status: DC | PRN
  Administered 2022-05-30: 60 mg via INTRAVENOUS

## 2022-05-30 MED ORDER — KETOROLAC TROMETHAMINE 30 MG/ML IJ SOLN
INTRAMUSCULAR | Status: AC
  Filled 2022-05-30: qty 1

## 2022-05-30 MED ORDER — KETOROLAC TROMETHAMINE 30 MG/ML IJ SOLN
30.0000 mg | Freq: Once | INTRAMUSCULAR | Status: AC | PRN
  Administered 2022-05-30: 30 mg via INTRAVENOUS

## 2022-05-30 MED ORDER — LACTATED RINGERS IV SOLN
INTRAVENOUS | Status: DC
Start: 2022-05-30 — End: 2022-05-31

## 2022-05-30 MED ORDER — PROPOFOL 500 MG/50ML IV EMUL
INTRAVENOUS | Status: DC | PRN
  Administered 2022-05-30: 50 ug/kg/min via INTRAVENOUS

## 2022-05-30 MED ORDER — OXYCODONE HCL 5 MG PO TABS: 5.0000 mg | ORAL_TABLET | Freq: Once | ORAL | Status: DC | PRN

## 2022-05-30 MED ORDER — DIPHENHYDRAMINE HCL 50 MG/ML IJ SOLN
INTRAMUSCULAR | Status: AC
  Filled 2022-05-30: qty 1

## 2022-05-30 MED ORDER — SODIUM CHLORIDE 0.9 % IV SOLN
500.0000 mg | Freq: Once | INTRAVENOUS | Status: AC
  Administered 2022-05-30: 500 mg via INTRAVENOUS
  Filled 2022-05-30: qty 25

## 2022-05-30 MED ORDER — FENTANYL CITRATE (PF) 100 MCG/2ML IJ SOLN: 25.0000 ug | INTRAMUSCULAR | Status: DC | PRN

## 2022-05-30 MED ORDER — NORETHINDRONE ACETATE 5 MG PO TABS: 10.0000 mg | ORAL_TABLET | Freq: Every day | ORAL | 3 refills | Status: DC

## 2022-05-30 MED ORDER — PROPOFOL 10 MG/ML IV BOLUS
INTRAVENOUS | Status: AC
  Filled 2022-05-30: qty 20

## 2022-05-30 MED ORDER — LIDOCAINE HCL 1 % IJ SOLN
INTRAMUSCULAR | Status: DC | PRN
  Administered 2022-05-30: 20 mL

## 2022-05-30 MED ORDER — ORAL CARE MOUTH RINSE: 15.0000 mL | Freq: Once | OROMUCOSAL | Status: AC

## 2022-05-30 MED ORDER — POVIDONE-IODINE 10 % EX SWAB
2.0000 | Freq: Once | CUTANEOUS | Status: AC
Start: 2022-05-30 — End: 2022-05-30
  Administered 2022-05-30: 2 via TOPICAL

## 2022-05-30 MED ORDER — ACETAMINOPHEN 500 MG PO TABS
1000.0000 mg | ORAL_TABLET | ORAL | Status: AC
  Administered 2022-05-30: 1000 mg via ORAL
  Filled 2022-05-30: qty 2

## 2022-05-30 MED ORDER — PROPOFOL 10 MG/ML IV BOLUS
INTRAVENOUS | Status: DC | PRN
  Administered 2022-05-30: 200 mg via INTRAVENOUS
  Administered 2022-05-30: 50 mg via INTRAVENOUS

## 2022-05-30 MED ORDER — MIDAZOLAM HCL 2 MG/2ML IJ SOLN
INTRAMUSCULAR | Status: AC
  Filled 2022-05-30: qty 2

## 2022-05-30 MED ORDER — ONDANSETRON HCL 4 MG/2ML IJ SOLN
INTRAMUSCULAR | Status: AC
  Filled 2022-05-30: qty 2

## 2022-05-30 MED ORDER — GABAPENTIN 300 MG PO CAPS
300.0000 mg | ORAL_CAPSULE | ORAL | Status: AC
Start: 2022-05-30 — End: 2022-05-30
  Administered 2022-05-30: 300 mg via ORAL
  Filled 2022-05-30: qty 1

## 2022-05-30 MED ORDER — OXYCODONE HCL 5 MG/5ML PO SOLN: 5.0000 mg | Freq: Once | ORAL | Status: DC | PRN

## 2022-05-30 MED ORDER — DEXAMETHASONE SODIUM PHOSPHATE 10 MG/ML IJ SOLN
INTRAMUSCULAR | Status: AC
  Filled 2022-05-30: qty 1

## 2022-05-30 MED ORDER — FENTANYL CITRATE (PF) 250 MCG/5ML IJ SOLN
INTRAMUSCULAR | Status: AC
  Filled 2022-05-30: qty 5

## 2022-05-30 MED ORDER — CHLORHEXIDINE GLUCONATE 0.12 % MT SOLN
15.0000 mL | Freq: Once | OROMUCOSAL | Status: AC
  Administered 2022-05-30: 15 mL via OROMUCOSAL
  Filled 2022-05-30: qty 15

## 2022-05-30 MED ORDER — ROCURONIUM BROMIDE 10 MG/ML (PF) SYRINGE
PREFILLED_SYRINGE | INTRAVENOUS | Status: AC
  Filled 2022-05-30: qty 10

## 2022-05-30 MED ORDER — LIDOCAINE 2% (20 MG/ML) 5 ML SYRINGE
INTRAMUSCULAR | Status: AC
  Filled 2022-05-30: qty 5

## 2022-05-30 MED ORDER — LIDOCAINE HCL 1 % IJ SOLN
INTRAMUSCULAR | Status: AC
  Filled 2022-05-30: qty 20

## 2022-05-30 MED ORDER — FENTANYL CITRATE (PF) 250 MCG/5ML IJ SOLN
INTRAMUSCULAR | Status: DC | PRN
  Administered 2022-05-30: 100 ug via INTRAVENOUS

## 2022-05-30 MED ORDER — AMISULPRIDE (ANTIEMETIC) 5 MG/2ML IV SOLN: 10.0000 mg | Freq: Once | INTRAVENOUS | Status: DC | PRN

## 2022-05-30 MED ORDER — DEXAMETHASONE SODIUM PHOSPHATE 10 MG/ML IJ SOLN
INTRAMUSCULAR | Status: DC | PRN
  Administered 2022-05-30: 10 mg via INTRAVENOUS

## 2022-05-30 SURGICAL SUPPLY — 18 items
CANISTER SUCT 3000ML PPV (MISCELLANEOUS) ×2 IMPLANT
CATH ROBINSON RED A/P 16FR (CATHETERS) ×2 IMPLANT
DILATOR CANAL MILEX (MISCELLANEOUS) ×1 IMPLANT
GLOVE ECLIPSE 7.0 STRL STRAW (GLOVE) ×2 IMPLANT
GLOVE SURG UNDER POLY LF SZ7 (GLOVE) ×4 IMPLANT
GOWN STRL REUS W/ TWL LRG LVL3 (GOWN DISPOSABLE) ×2 IMPLANT
GOWN STRL REUS W/TWL LRG LVL3 (GOWN DISPOSABLE) ×2
IV NS IRRIG 3000ML ARTHROMATIC (IV SOLUTION) ×2 IMPLANT
KIT PROCEDURE FLUENT (KITS) ×1 IMPLANT
MYOSURE XL FIBROID (MISCELLANEOUS) ×2
PACK VAGINAL MINOR WOMEN LF (CUSTOM PROCEDURE TRAY) ×2 IMPLANT
PAD OB MATERNITY 4.3X12.25 (PERSONAL CARE ITEMS) ×2 IMPLANT
SEAL CERVICAL OMNI LOK (ABLATOR) ×1 IMPLANT
SEAL ROD LENS SCOPE MYOSURE (ABLATOR) ×1 IMPLANT
SET GENESYS HTA PROCERVA (MISCELLANEOUS) ×2 IMPLANT
SYSTEM TISS REMOVAL MYOSURE XL (MISCELLANEOUS) IMPLANT
TOWEL GREEN STERILE FF (TOWEL DISPOSABLE) ×3 IMPLANT
UNDERPAD 30X36 HEAVY ABSORB (UNDERPADS AND DIAPERS) ×2 IMPLANT

## 2022-05-30 NOTE — Transfer of Care (Signed)
Immediate Anesthesia Transfer of Care Note  Patient: Ashley Orr  Procedure(s) Performed: DILATATION & CURETTAGE/HYSTEROSCOPY WITH ATTEMPTED HYDROTHERMAL ABLATION;  MYOSURE (Uterus)  Patient Location: PACU  Anesthesia Type:General  Level of Consciousness: sedated  Airway & Oxygen Therapy: Patient Spontanous Breathing  Post-op Assessment: Report given to RN and Post -op Vital signs reviewed and stable  Post vital signs: Reviewed and stable  Last Vitals:  Vitals Value Taken Time  BP 138/85 05/30/22 1608  Temp    Pulse 83 05/30/22 1608  Resp 21 05/30/22 1609  SpO2 99 % 05/30/22 1608  Vitals shown include unvalidated device data.  Last Pain:  Vitals:   05/30/22 1329  TempSrc:   PainSc: 6          Complications: No notable events documented.

## 2022-05-30 NOTE — Op Note (Signed)
Preoperative diagnosis: Menorrhagia  Postoperative diagnosis: Menorrhagia  Procedure: Attempt at HTA endometrial ablation, hysteroscopy, D & C  Surgeon: Standley Dakins. Kennon Rounds, M.D.  Anesthesia: GETT - Ellender, Karyl Kinnier, MD Paracervical block  Findings: Normal appearing cervix and uterine cavity with  submucosal fibroid.  Fluid Loss: 250 cc  Estimated blood loss: Minimal  Specimens: Endometrial curettings  Disposition of specimen: Pathology  Reason for procedure: Patient is a 51 y.o. [id number] with abnormal bleeding who desired definitive treatment.   Procedure: Patient was taken to the OR where she was placed in dorsal lithotomy in [place]. SCDs were in place. An adequate timeout was obtained. The patient was prepped and draped in the usual sterile fashion. A red rubber catheter is used to drain her bladder. A speculum was placed inside the vagina and the cervix visualized. The cervix was grasped anteriorly with a single-tooth tenaculum. 20 cc of quarter percent Marcaine were injected for paracervical block.  Sequential dilation was done to a #8 dilator, and the HTA with hysteroscope was introduced into the uterine cavity. The cervix and endometrial lining appeared normal both ostia were seen there was no deformity of the cavity. A seal test was done and was failed x 3. Hysteroscope changed to allow Myosure. Significant fluid loss at cervix. A second tenaculum applied and continued. Omnilock used and still could not see well. Attempt at Myosure, did not remove any tissue or shave down the fibroid.  Sharp curettage was then performed and sample sent to pathology. All instrument, needle, and lap counts were correct x 2. The patient was awakened taken to recovery room in stable condition.  [name] MD 05/30/2022 3:58 PM

## 2022-05-30 NOTE — Anesthesia Preprocedure Evaluation (Addendum)
Anesthesia Evaluation  Patient identified by MRN, date of birth, ID band Patient awake    Reviewed: Allergy & Precautions, NPO status , Patient's Chart, lab work & pertinent test results  Airway Mallampati: II  TM Distance: >3 FB Neck ROM: Full    Dental no notable dental hx.    Pulmonary Current SmokerPatient did not abstain from smoking.,    Pulmonary exam normal        Cardiovascular hypertension, Pt. on medications Normal cardiovascular exam     Neuro/Psych PSYCHIATRIC DISORDERS Depression negative neurological ROS     GI/Hepatic negative GI ROS, Neg liver ROS,   Endo/Other  Hypothyroidism   Renal/GU negative Renal ROS     Musculoskeletal negative musculoskeletal ROS (+)   Abdominal (+) + obese,   Peds  Hematology  (+) REFUSES BLOOD PRODUCTS,   Anesthesia Other Findings AUB  Reproductive/Obstetrics hcg negative                            Anesthesia Physical Anesthesia Plan  ASA: 2  Anesthesia Plan: General   Post-op Pain Management: Minimal or no pain anticipated   Induction: Intravenous  PONV Risk Score and Plan: 2 and Ondansetron, Dexamethasone, Treatment may vary due to age or medical condition, Midazolam and Propofol infusion  Airway Management Planned: LMA  Additional Equipment:   Intra-op Plan:   Post-operative Plan: Extubation in OR  Informed Consent: I have reviewed the patients History and Physical, chart, labs and discussed the procedure including the risks, benefits and alternatives for the proposed anesthesia with the patient or authorized representative who has indicated his/her understanding and acceptance.     Dental advisory given  Plan Discussed with: CRNA and Surgeon  Anesthesia Plan Comments:        Anesthesia Quick Evaluation

## 2022-05-30 NOTE — Anesthesia Procedure Notes (Signed)
Procedure Name: LMA Insertion Date/Time: 05/30/2022 2:56 PM  Performed by: Minerva Ends, CRNAPre-anesthesia Checklist: Patient identified, Emergency Drugs available, Suction available, Patient being monitored and Timeout performed Patient Re-evaluated:Patient Re-evaluated prior to induction Oxygen Delivery Method: Circle system utilized Preoxygenation: Pre-oxygenation with 100% oxygen Induction Type: IV induction Ventilation: Mask ventilation without difficulty LMA: LMA inserted LMA Size: 4.0 Number of attempts: 1 Tube secured with: Tape Dental Injury: Teeth and Oropharynx as per pre-operative assessment

## 2022-05-30 NOTE — H&P (Signed)
Ashley Orr is an 51 y.o. [phone] female.   Chief Complaint: abnormal bleeding HPI: Having pain, feels tired all the time and bleeding constantly.Cyles last 7-21 days. They are heavy, has bleeding of 1 pad/hour. Lots of clots. No signs of menopause yet. Cycles are monthly. Has h/o prior c-section x 3 with vertical midline skin incisions. Reports prior w/u in [place] with EMB 2 years ago. S/p BTL. Wants to preserve uterus. EMB negative here last week.  Past Medical History:  Diagnosis Date   Anemia    Hypertension    Hypothyroidism (acquired) 12/17/2021   MDD (major depressive disorder)    Restless legs syndrome    Thyroid disease     Past Surgical History:  Procedure Laterality Date   APPENDECTOMY     CESAREAN SECTION     TUBAL LIGATION      Family History  Problem Relation Age of Onset   Stroke Mother    COPD Mother    Hypertension Mother    Heart disease Father    Diabetes Father    Hypertension Father    Hypertension Brother    Diabetes Brother    Social History:  reports that she has been smoking cigarettes. She has been smoking an average of 1 pack per day. She has never used smokeless tobacco. She reports that she does not currently use alcohol. She reports that she does not currently use drugs.  Allergies:  Allergies  Allergen Reactions   Codeine Anaphylaxis    Medications Prior to Admission  Medication Sig Dispense Refill   Ascorbic Acid (VITAMIN C PO) Take 60 mg by mouth daily.     buPROPion (WELLBUTRIN SR) 150 MG 12 hr tablet TAKE 1 TABLET BY MOUTH EVERY DAY 90 tablet 0   Capsaicin 0.025 % PTCH Apply 1 patch topically daily as needed (sciatica).     CASCARA SAGRADA PO Take 1 tablet by mouth daily as needed (Constipation).     Cholecalciferol 50 MCG (2000 UT) CAPS Take 2,000 Units by mouth daily.     citalopram (CELEXA) 40 MG tablet TAKE 1 TABLET BY MOUTH EVERY DAY 90 tablet 0   Cranberry 500 MG CAPS Take 2 capsules by mouth daily.  Probiotic-Prebiotic With vitamin C 60 mg     Cyanocobalamin (VITAMIN B-12) 5000 MCG SUBL Place 5,000 mcg under the tongue daily.     cyclobenzaprine (FLEXERIL) 10 MG tablet Take 1 tablet (10 mg total) by mouth 3 (three) times daily as needed for muscle spasms. 30 tablet 2   ferrous sulfate 325 (65 FE) MG tablet Take 325 mg by mouth daily with breakfast.     hydrochlorothiazide (HYDRODIURIL) 25 MG tablet Take 1 tablet (25 mg total) by mouth daily. 90 tablet 0   ibuprofen (ADVIL) 800 MG tablet TAKE 1 TABLET BY MOUTH EVERY 8 HOURS AS NEEDED 30 tablet 2   levothyroxine (SYNTHROID) 175 MCG tablet Take 1 tablet (175 mcg total) by mouth See admin instructions. Alternating with 200 mcg every other day 90 tablet 0   levothyroxine (SYNTHROID) 200 MCG tablet Take 1 tablet (200 mcg total) by mouth See admin instructions. Alternating with 175 mcg every other day. 90 tablet 0   losartan (COZAAR) 50 MG tablet Take 1 tablet (50 mg total) by mouth daily. 90 tablet 0   norethindrone (AYGESTIN) 5 MG tablet Take 1 tablet (5 mg total) by mouth daily. 90 tablet 3   OVER THE COUNTER MEDICATION Take 1 Scoop by mouth daily. Total Beets Juice 16  oz with water     phentermine 37.5 MG capsule Take 1 capsule (37.5 mg total) by mouth every morning. 30 capsule 0   temazepam (RESTORIL) 30 MG capsule Take 30 mg by mouth at bedtime as needed for sleep.     traZODone (DESYREL) 100 MG tablet Take 1 tablet (100 mg total) by mouth at bedtime. 90 tablet 0   Venlafaxine HCl 37.5 MG TB24 Take 37.5 mg by mouth daily.      A comprehensive review of systems was negative.  Blood pressure 134/86, pulse 93, temperature 99.5 F (37.5 C), temperature source Oral, resp. rate 18, height '5\' 2"'$  (1.575 m), weight 95.6 kg, SpO2 100 %. BP 134/86   Pulse 93   Temp 99.5 F (37.5 C) (Oral)   Resp 18   Ht '5\' 2"'$  (1.575 m)   Wt 95.6 kg   SpO2 100%   BMI 38.55 kg/m  General appearance: alert, cooperative, and appears stated age Head:  Normocephalic, without obvious abnormality, atraumatic Neck: supple, symmetrical, trachea midline Lungs:  normal effort Heart: regular rate and rhythm Abdomen: soft, non-tender; bowel sounds normal; no masses,  no organomegaly Extremities: extremities normal, atraumatic, no cyanosis or edema Skin: Skin color, texture, turgor normal. No rashes or lesions Neurologic: Grossly normal   Lab Results  Component Value Date   WBC 6.7 04/13/2022   HGB 7.9 (L) 04/13/2022   HCT 27.5 (L) 04/13/2022   MCV 69 (L) 04/13/2022   PLT 327 04/13/2022   Lab Results  Component Value Date   PREGTESTUR NEGATIVE 05/30/2022     Assessment/Plan Principal Problem:   Abnormal uterine bleeding  For D & C with hysteroscopy and HTA Risks include but are not limited to bleeding, infection, injury to surrounding structures, including bowel, bladder and ureters, blood clots, and death.  Likelihood of success is high.    Ashley Orr 05/30/2022, 2:25 PM

## 2022-05-31 ENCOUNTER — Encounter (HOSPITAL_COMMUNITY): Payer: Self-pay | Admitting: Family Medicine

## 2022-05-31 NOTE — Anesthesia Postprocedure Evaluation (Signed)
Anesthesia Post Note  Patient: Ashley Orr  Procedure(s) Performed: DILATATION & CURETTAGE/HYSTEROSCOPY WITH ATTEMPTED HYDROTHERMAL ABLATION (Uterus) ATTEMPTED DILATATION & CURETTAGE/HYSTEROSCOPY WITH MYOSURE (Uterus)     Patient location during evaluation: PACU Anesthesia Type: General Level of consciousness: awake Pain management: pain level controlled Vital Signs Assessment: post-procedure vital signs reviewed and stable Respiratory status: spontaneous breathing, nonlabored ventilation, respiratory function stable and patient connected to nasal cannula oxygen Cardiovascular status: blood pressure returned to baseline and stable Postop Assessment: no apparent nausea or vomiting Anesthetic complications: no   No notable events documented.  Last Vitals:  Vitals:   05/30/22 1930 05/30/22 2000  BP: (!) 133/91 (!) 121/94  Pulse: 98 99  Resp: 17 20  Temp:  37.1 C  SpO2: 99% 97%    Last Pain:  Vitals:   05/30/22 2000  TempSrc:   PainSc: 0-No pain                 Omaya Nieland P Rayann Jolley

## 2022-06-01 ENCOUNTER — Ambulatory Visit: Payer: 59 | Admitting: Family Medicine

## 2022-06-01 LAB — SURGICAL PATHOLOGY

## 2022-06-02 ENCOUNTER — Encounter: Payer: Self-pay | Admitting: Physician Assistant

## 2022-06-05 ENCOUNTER — Encounter (HOSPITAL_COMMUNITY): Payer: Self-pay | Admitting: Emergency Medicine

## 2022-06-05 ENCOUNTER — Ambulatory Visit: Admission: EM | Admit: 2022-06-05 | Discharge: 2022-06-05 | Payer: 59

## 2022-06-05 ENCOUNTER — Emergency Department (HOSPITAL_BASED_OUTPATIENT_CLINIC_OR_DEPARTMENT_OTHER): Payer: 59

## 2022-06-05 ENCOUNTER — Observation Stay (HOSPITAL_COMMUNITY)
Admission: EM | Admit: 2022-06-05 | Discharge: 2022-06-06 | Disposition: A | Discharge status: Home / Self Care | Payer: 59 | Attending: Internal Medicine | Admitting: Internal Medicine

## 2022-06-05 ENCOUNTER — Emergency Department (HOSPITAL_COMMUNITY): Payer: 59

## 2022-06-05 DIAGNOSIS — Z9889 Other specified postprocedural states: Secondary | ICD-10-CM | POA: Diagnosis not present

## 2022-06-05 DIAGNOSIS — M7989 Other specified soft tissue disorders: Secondary | ICD-10-CM

## 2022-06-05 DIAGNOSIS — Z79899 Other long term (current) drug therapy: Secondary | ICD-10-CM | POA: Diagnosis not present

## 2022-06-05 DIAGNOSIS — N938 Other specified abnormal uterine and vaginal bleeding: Secondary | ICD-10-CM | POA: Insufficient documentation

## 2022-06-05 DIAGNOSIS — I82402 Acute embolism and thrombosis of unspecified deep veins of left lower extremity: Secondary | ICD-10-CM | POA: Diagnosis not present

## 2022-06-05 DIAGNOSIS — R52 Pain, unspecified: Secondary | ICD-10-CM

## 2022-06-05 DIAGNOSIS — R770 Abnormality of albumin: Secondary | ICD-10-CM | POA: Insufficient documentation

## 2022-06-05 DIAGNOSIS — F329 Major depressive disorder, single episode, unspecified: Secondary | ICD-10-CM | POA: Diagnosis present

## 2022-06-05 DIAGNOSIS — R69 Illness, unspecified: Secondary | ICD-10-CM | POA: Diagnosis not present

## 2022-06-05 DIAGNOSIS — I10 Essential (primary) hypertension: Secondary | ICD-10-CM | POA: Diagnosis not present

## 2022-06-05 DIAGNOSIS — Z7901 Long term (current) use of anticoagulants: Secondary | ICD-10-CM | POA: Diagnosis not present

## 2022-06-05 DIAGNOSIS — I2699 Other pulmonary embolism without acute cor pulmonale: Principal | ICD-10-CM | POA: Insufficient documentation

## 2022-06-05 DIAGNOSIS — E876 Hypokalemia: Secondary | ICD-10-CM | POA: Diagnosis not present

## 2022-06-05 DIAGNOSIS — R0602 Shortness of breath: Secondary | ICD-10-CM | POA: Diagnosis not present

## 2022-06-05 DIAGNOSIS — I82442 Acute embolism and thrombosis of left tibial vein: Secondary | ICD-10-CM | POA: Diagnosis not present

## 2022-06-05 DIAGNOSIS — F17210 Nicotine dependence, cigarettes, uncomplicated: Secondary | ICD-10-CM | POA: Diagnosis not present

## 2022-06-05 DIAGNOSIS — E039 Hypothyroidism, unspecified: Secondary | ICD-10-CM | POA: Diagnosis not present

## 2022-06-05 DIAGNOSIS — Z6838 Body mass index (BMI) 38.0-38.9, adult: Secondary | ICD-10-CM | POA: Diagnosis not present

## 2022-06-05 LAB — COMPREHENSIVE METABOLIC PANEL
ALT: 21 U/L (ref 0–44)
AST: 20 U/L (ref 15–41)
Albumin: 3.4 g/dL — ABNORMAL LOW (ref 3.5–5.0)
Alkaline Phosphatase: 57 U/L (ref 38–126)
Anion gap: 12 (ref 5–15)
BUN: <5 mg/dL — ABNORMAL LOW (ref 6–20)
CO2: 23 mmol/L (ref 22–32)
Calcium: 9.2 mg/dL (ref 8.9–10.3)
Chloride: 103 mmol/L (ref 98–111)
Creatinine, Ser: 0.94 mg/dL (ref 0.44–1.00)
GFR, Estimated: >60 mL/min (ref >60)
Glucose, Bld: 115 mg/dL — ABNORMAL HIGH (ref 70–99)
Potassium: 2.7 mmol/L — CL (ref 3.5–5.1)
Sodium: 138 mmol/L (ref 135–145)
Total Bilirubin: 0.2 mg/dL — ABNORMAL LOW (ref 0.3–1.2)
Total Protein: 7.2 g/dL (ref 6.5–8.1)

## 2022-06-05 LAB — CBC WITH DIFFERENTIAL/PLATELET
Abs Immature Granulocytes: 0.05 10*3/uL (ref 0.00–0.07)
Basophils Absolute: 0 10*3/uL (ref 0.0–0.1)
Basophils Relative: 0 %
Eosinophils Absolute: 0.1 10*3/uL (ref 0.0–0.5)
Eosinophils Relative: 1 %
HCT: 30.6 % — ABNORMAL LOW (ref 36.0–46.0)
Hemoglobin: 8.6 g/dL — ABNORMAL LOW (ref 12.0–15.0)
Immature Granulocytes: 1 %
Lymphocytes Relative: 18 %
Lymphs Abs: 1.7 10*3/uL (ref 0.7–4.0)
MCH: 21.2 pg — ABNORMAL LOW (ref 26.0–34.0)
MCHC: 28.1 g/dL — ABNORMAL LOW (ref 30.0–36.0)
MCV: 75.4 fL — ABNORMAL LOW (ref 80.0–100.0)
Monocytes Absolute: 0.7 10*3/uL (ref 0.1–1.0)
Monocytes Relative: 7 %
Neutro Abs: 7 10*3/uL (ref 1.7–7.7)
Neutrophils Relative %: 73 %
Platelets: 272 10*3/uL (ref 150–400)
RBC: 4.06 MIL/uL (ref 3.87–5.11)
RDW: 27.7 % — ABNORMAL HIGH (ref 11.5–15.5)
WBC: 9.6 10*3/uL (ref 4.0–10.5)
nRBC: 0.3 % — ABNORMAL HIGH (ref 0.0–0.2)

## 2022-06-05 LAB — HCG, QUANTITATIVE, PREGNANCY: hCG, Beta Chain, Quant, S: 1 m[IU]/mL (ref <5)

## 2022-06-05 LAB — TROPONIN I (HIGH SENSITIVITY)
Troponin I (High Sensitivity): 8 ng/L (ref <18)
Troponin I (High Sensitivity): 9 ng/L (ref <18)

## 2022-06-05 LAB — D-DIMER, QUANTITATIVE: D-Dimer, Quant: 1.97 ug/mL-FEU — ABNORMAL HIGH (ref 0.00–0.50)

## 2022-06-05 MED ORDER — POTASSIUM CHLORIDE 10 MEQ/100ML IV SOLN
10.0000 meq | INTRAVENOUS | Status: AC
  Administered 2022-06-05 – 2022-06-06 (×5): 10 meq via INTRAVENOUS
  Filled 2022-06-05 (×5): qty 100

## 2022-06-05 MED ORDER — APIXABAN 5 MG PO TABS
10.0000 mg | ORAL_TABLET | Freq: Two times a day (BID) | ORAL | Status: DC
  Administered 2022-06-06 (×2): 10 mg via ORAL
  Filled 2022-06-05 (×2): qty 2

## 2022-06-05 MED ORDER — KETOROLAC TROMETHAMINE 15 MG/ML IJ SOLN
15.0000 mg | Freq: Once | INTRAMUSCULAR | Status: AC
  Administered 2022-06-05: 15 mg via INTRAVENOUS
  Filled 2022-06-05: qty 1

## 2022-06-05 MED ORDER — APIXABAN 5 MG PO TABS: 5.0000 mg | ORAL_TABLET | Freq: Two times a day (BID) | ORAL | Status: DC

## 2022-06-05 MED ORDER — POTASSIUM CHLORIDE CRYS ER 20 MEQ PO TBCR
40.0000 meq | EXTENDED_RELEASE_TABLET | Freq: Once | ORAL | Status: AC
  Administered 2022-06-05: 40 meq via ORAL
  Filled 2022-06-05: qty 2

## 2022-06-05 MED ORDER — APIXABAN (ELIQUIS) EDUCATION KIT FOR DVT/PE PATIENTS
PACK | Freq: Once | Status: DC
  Filled 2022-06-05: qty 1

## 2022-06-05 NOTE — ED Provider Notes (Incomplete)
[facility] EMERGENCY DEPARTMENT Provider Note   CSN: [id number] Arrival date & time: 06/05/22  1314     History {Add pertinent medical, surgical, social history, OB history to HPI:1} Chief Complaint  Patient presents with   Shortness of Breath    Ashley Orr is a 51 y.o. female.   Shortness of Breath Patient presents for extremity swelling and shortness of breath.  Medical history includes HTN, anemia, depression, hypothyroidism.  She has had recent AUB.  With the bleeding, she has experienced fatigue without shortness of breath.  She underwent a D&C on 7/11.  She was told that they will need to repeat the procedure.  Since that time, she has developed right upper extremity swelling, left calf pain, and shortness of breath.  Additional symptoms include worsening fatigue.  Patient has not recently received any blood transfusions.  She has been given iron supplementation.  She has no history of VTE.  She has had continued very light vaginal bleeding.     Home Medications Prior to Admission medications   Medication Sig Start Date End Date Taking? Authorizing Provider  Ascorbic Acid (VITAMIN C PO) Take 60 mg by mouth daily.    [provider]  buPROPion [facility] SR) 150 MG 12 hr tablet TAKE 1 TABLET BY MOUTH EVERY DAY 02/22/22   Camillia Herter, NP  Capsaicin 0.025 % PTCH Apply 1 patch topically daily as needed (sciatica).    [provider]  CASCARA SAGRADA PO Take 1 tablet by mouth daily as needed (Constipation).    [provider]  Cholecalciferol 50 MCG (2000 UT) CAPS Take 2,000 Units by mouth daily.    [provider]  citalopram (CELEXA) 40 MG tablet TAKE 1 TABLET BY MOUTH EVERY DAY 02/22/22   Camillia Herter, NP  Cranberry 500 MG CAPS Take 2 capsules by mouth daily. Probiotic-Prebiotic With vitamin C 60 mg    [provider]  Cyanocobalamin (VITAMIN B-12) 5000 MCG SUBL Place 5,000 mcg under the tongue  daily.    [provider]  cyclobenzaprine (FLEXERIL) 10 MG tablet Take 1 tablet (10 mg total) by mouth 3 (three) times daily as needed for muscle spasms. 03/16/22   Camillia Herter, NP  ferrous sulfate 325 (65 FE) MG tablet Take 325 mg by mouth daily with breakfast.    [provider]  hydrochlorothiazide (HYDRODIURIL) 25 MG tablet Take 1 tablet (25 mg total) by mouth daily. 02/22/22 05/23/22  Camillia Herter, NP  ibuprofen (ADVIL) 800 MG tablet TAKE 1 TABLET BY MOUTH EVERY 8 HOURS AS NEEDED 03/17/22   Camillia Herter, NP  levothyroxine (SYNTHROID) 175 MCG tablet Take 1 tablet (175 mcg total) by mouth See admin instructions. Alternating with 200 mcg every other day 01/30/22   Camillia Herter, NP  levothyroxine (SYNTHROID) 200 MCG tablet Take 1 tablet (200 mcg total) by mouth See admin instructions. Alternating with 175 mcg every other day. 01/30/22   Camillia Herter, NP  losartan (COZAAR) 50 MG tablet Take 1 tablet (50 mg total) by mouth daily. 02/22/22   Camillia Herter, NP  norethindrone (AYGESTIN) 5 MG tablet Take 2 tablets (10 mg total) by mouth daily. 05/30/22   [name], MD  OVER THE COUNTER MEDICATION Take 1 Scoop by mouth daily. Total Beets Juice 16 oz with water    [provider]  phentermine 37.5 MG capsule Take 1 capsule (37.5 mg total) by mouth every morning. 04/14/22 05/22/22  Minette Brine,  Amy J, NP  temazepam (RESTORIL) 30 MG capsule Take 30 mg by mouth at bedtime as needed for sleep.    [provider]  traZODone (DESYREL) 100 MG tablet Take 1 tablet (100 mg total) by mouth at bedtime. 02/22/22   Camillia Herter, NP  Venlafaxine HCl 37.5 MG TB24 Take 37.5 mg by mouth daily.    [provider]      Allergies    Codeine    Review of Systems   Review of Systems  Constitutional:  Positive for fatigue.  Respiratory:  Positive for shortness of breath.   Cardiovascular:  Positive for leg swelling.  Genitourinary:  Positive for vaginal bleeding.  All  other systems reviewed and are negative.   Physical Exam Updated Vital Signs BP 125/79 (BP Location: Left Arm)   Pulse (!) 101   Temp 99.1 F (37.3 C) (Oral)   Resp 18   Ht '5\' 2"'$  (1.575 m)   Wt 95.6 kg   LMP 06/05/2022   SpO2 99%   BMI 38.55 kg/m  Physical Exam Vitals and nursing note reviewed.  Constitutional:      General: She is not in acute distress.    Appearance: She is well-developed. She is not ill-appearing, toxic-appearing or diaphoretic.  HENT:     Head: Normocephalic and atraumatic.     Mouth/Throat:     Mouth: Mucous membranes are moist.     Pharynx: Oropharynx is clear.  Eyes:     Conjunctiva/sclera: Conjunctivae normal.  Cardiovascular:     Rate and Rhythm: Regular rhythm. Tachycardia present.     Heart sounds: No murmur heard. Pulmonary:     Effort: Pulmonary effort is normal. No tachypnea, accessory muscle usage or respiratory distress.     Breath sounds: Normal breath sounds. No wheezing, rhonchi or rales.  Abdominal:     Palpations: Abdomen is soft.     Tenderness: There is no abdominal tenderness.  Musculoskeletal:        General: No swelling.     Cervical back: Normal range of motion and neck supple.     Right lower leg: No edema.     Left lower leg: Tenderness present. No edema.  Skin:    General: Skin is warm and dry.     Findings: Erythema present.  Neurological:     General: No focal deficit present.     Mental Status: She is alert and oriented to person, place, and time.  Psychiatric:        Mood and Affect: Mood normal.        Behavior: Behavior normal.     ED Results / Procedures / Treatments   Labs (all labs ordered are listed, but only abnormal results are displayed) Labs Reviewed  CBC WITH DIFFERENTIAL/PLATELET - Abnormal; Notable for the following components:      Result Value   Hemoglobin 8.6 (*)    HCT 30.6 (*)    MCV 75.4 (*)    MCH 21.2 (*)    MCHC 28.1 (*)    RDW 27.7 (*)    nRBC 0.3 (*)    All other components  within normal limits  COMPREHENSIVE METABOLIC PANEL - Abnormal; Notable for the following components:   Potassium 2.7 (*)    Glucose, Bld 115 (*)    BUN <5 (*)    Albumin 3.4 (*)    Total Bilirubin 0.2 (*)    All other components within normal limits  D-DIMER, QUANTITATIVE - Abnormal; Notable for the  following components:   D-Dimer, Quant 1.97 (*)    All other components within normal limits  HCG, QUANTITATIVE, PREGNANCY  URINALYSIS, ROUTINE W REFLEX MICROSCOPIC  TROPONIN I (HIGH SENSITIVITY)  TROPONIN I (HIGH SENSITIVITY)    EKG None  Radiology UE Venous Duplex ([facility] and [facility] ONLY)  Result Date: 06/05/2022 UPPER VENOUS STUDY  Patient Name:  DEBANY VANTOL  Date of Exam:   06/05/2022 Medical Rec #: [id number]                 Accession #:    [id number] Date of Birth: July 27, 1971                 Patient Gender: F Patient Age:   37 years Exam Location:  [facility] Procedure:      VAS [name] UPPER EXTREMITY VENOUS DUPLEX Referring Phys: RILEY RANSOM --------------------------------------------------------------------------------  Indications: Post-op, and pain Performing Technologist: Bobetta Lime BS, RVT  Examination Guidelines: A complete evaluation includes B-mode imaging, spectral Doppler, color Doppler, and power Doppler as needed of all accessible portions of each vessel. Bilateral testing is considered an integral part of a complete examination. Limited examinations for reoccurring indications may be performed as noted.  Right Findings: +----------+------------+---------+-----------+----------+-------+ RIGHT     CompressiblePhasicitySpontaneousPropertiesSummary +----------+------------+---------+-----------+----------+-------+ IJV           Full       Yes       Yes                      +----------+------------+---------+-----------+----------+-------+ Subclavian    Full       Yes       Yes                       +----------+------------+---------+-----------+----------+-------+ Axillary      Full       Yes       Yes                      +----------+------------+---------+-----------+----------+-------+ Brachial      Full                                          +----------+------------+---------+-----------+----------+-------+ Radial        Full                                          +----------+------------+---------+-----------+----------+-------+ Ulnar         Full                                          +----------+------------+---------+-----------+----------+-------+ Cephalic      None       No        No                       +----------+------------+---------+-----------+----------+-------+ Basilic       Full                                          +----------+------------+---------+-----------+----------+-------+ The right  cephalic vein is thrombosed throughout. Could not follow to its origin as it coursed under the clavicle.  Left Findings: +----------+------------+---------+-----------+----------+-------+ LEFT      CompressiblePhasicitySpontaneousPropertiesSummary +----------+------------+---------+-----------+----------+-------+ Subclavian    Full       Yes       Yes                      +----------+------------+---------+-----------+----------+-------+  Summary:  Right: No evidence of deep vein thrombosis in the upper extremity. Findings consistent with acute superficial vein thrombosis involving the right cephalic vein.  *See table(s) above for measurements and observations.    Preliminary    VAS [name] LOWER EXTREMITY VENOUS (DVT) (ONLY [facility] & [facility])  Result Date: 06/05/2022  Lower Venous DVT Study Patient Name:  AYANAH SNADER  Date of Exam:   06/05/2022 Medical Rec #: [id number]                 Accession #:    [id number] Date of Birth: 03-09-1971                 Patient Gender: F Patient Age:   51 years Exam Location:  [facility]  Procedure:      VAS [name] LOWER EXTREMITY VENOUS (DVT) Referring Phys: RILEY RANSOM --------------------------------------------------------------------------------  Indications: Pain.  Comparison Study: No previous exam noted. Performing Technologist: Bobetta Lime BS, RVT  Examination Guidelines: A complete evaluation includes B-mode imaging, spectral Doppler, color Doppler, and power Doppler as needed of all accessible portions of each vessel. Bilateral testing is considered an integral part of a complete examination. Limited examinations for reoccurring indications may be performed as noted. The reflux portion of the exam is performed with the patient in reverse Trendelenburg.  +-----+---------------+---------+-----------+----------+--------------+ RIGHTCompressibilityPhasicitySpontaneityPropertiesThrombus Aging +-----+---------------+---------+-----------+----------+--------------+ CFV  Full           Yes      Yes                                 +-----+---------------+---------+-----------+----------+--------------+   +---------+---------------+---------+-----------+----------+--------------+ LEFT     CompressibilityPhasicitySpontaneityPropertiesThrombus Aging +---------+---------------+---------+-----------+----------+--------------+ CFV      Full           Yes      Yes                                 +---------+---------------+---------+-----------+----------+--------------+ SFJ      Full                                                        +---------+---------------+---------+-----------+----------+--------------+ FV Prox  Full                                                        +---------+---------------+---------+-----------+----------+--------------+ FV Mid   Full                                                        +---------+---------------+---------+-----------+----------+--------------+  FV DistalFull                                                         +---------+---------------+---------+-----------+----------+--------------+ PFV      Full                                                        +---------+---------------+---------+-----------+----------+--------------+ POP      Full           Yes      Yes                                 +---------+---------------+---------+-----------+----------+--------------+ PTV      None           No       No                                  +---------+---------------+---------+-----------+----------+--------------+ PERO     Full                                                        +---------+---------------+---------+-----------+----------+--------------+     Summary: RIGHT: - No evidence of common femoral vein obstruction.  LEFT: - Findings consistent with acute deep vein thrombosis involving the left posterior tibial veins. - No cystic structure found in the popliteal fossa.  *See table(s) above for measurements and observations.    Preliminary     Procedures Procedures  {Document cardiac monitor, telemetry assessment procedure when appropriate:1}  Medications Ordered in ED Medications - No data to display  ED Course/ Medical Decision Making/ A&P                           Medical Decision Making Amount and/or Complexity of Data Reviewed Radiology: ordered.  Risk Prescription drug management.   This patient presents to the ED for concern of fatigue, shortness of breath, and extremity swelling, this involves an extensive number of treatment options, and is a complaint that carries with it a high risk of complications and morbidity.  The differential diagnosis includes DVT, PE, this insufficiency, worsened anemia   Co morbidities that complicate the patient evaluation  HTN, anemia, depression, hypothyroidism   Additional history obtained:  Additional history obtained from N/A External records from outside source obtained and reviewed including  EMR   Lab Tests:  I Ordered, and personally interpreted labs.  The pertinent results include: Hemoglobin slightly increased from baseline, no leukocytosis, elevated D-dimer, hypokalemia with otherwise normal electrolytes   Imaging Studies ordered:  I ordered imaging studies including venous duplex studies of right upper extremity and left lower extremity, CTA chest I independently visualized and interpreted imaging which showed right arm cephalic vein thrombosis; left leg posterior tibial DVT; CTA of chest showed***. I agree with the radiologist interpretation   Cardiac Monitoring: /  EKG:  The patient was maintained on a cardiac monitor.  I personally viewed and interpreted the cardiac monitored which showed an underlying rhythm of: Sinus rhythm   Consultations Obtained:  I requested consultation with the gynecologist, Dr.***,  and discussed lab and imaging findings as well as pertinent plan - they recommend: ***   Problem List / ED Course / Critical interventions / Medication management  Patient is a 51 year old female presenting for multiple recent symptoms including right arm pain and swelling, radiating into her shoulder, shortness of breath, and left calf pain and swelling.  Symptoms have been worsening over the past several days.  She did recently undergo a D&C procedure due to abnormal uterine bleeding.  She was told that they were not able to complete the procedure and that they would have to do something else.  Per chart review, it appears that they did take a tissue sample but did not perform any shave down during her cystoscopy.  Patient states that her vaginal bleeding has continued but is very light.  Prior to being bedded in the ED, patient did undergo initial work-up.  On work-up, she has not had an acute drop in her hemoglobin.  She does, however, have a new diagnosis of right arm cephalic vein thrombosis and left leg DVT.  Currently, her SPO2 is normal on room air.  Given  the new VTE, patient to undergo CTA of chest.  Additional work-up findings include hypokalemia.  Replacement potassium was ordered.  On exam, patient is well-appearing.  Her breathing is unlabored at this time.  She does have swelling, tenderness, and erythema to the area overlying her right cephalic vein.  She does have tenderness in her left calf with minimal appreciable swelling.  CTA of chest showed***.  I spoke with gynecologist on-call, Dr.***. I ordered medication including potassium chloride for hypokalemia,***. Reevaluation of the patient after these medicines showed that the patient {resolved/improved/worsened:23923::"improved"} I have reviewed the patients home medicines and have made adjustments as needed   Social Determinants of Health:  ***   Test / Admission - Considered:  ***   {Document critical care time when appropriate:1} {Document review of labs and clinical decision tools ie heart score, Chads2Vasc2 etc:1}  {Document your independent review of radiology images, and any outside records:1} {Document your discussion with family members, caretakers, and with consultants:1} {Document social determinants of health affecting pt's care:1} {Document your decision making why or why not admission, treatments were needed:1} Final Clinical Impression(s) / ED Diagnoses Final diagnoses:  None    Rx / DC Orders ED Discharge Orders     None

## 2022-06-05 NOTE — ED Triage Notes (Signed)
Pt presents to [facility] with co of pain in right arm, and l leg 5/10 with tylenol.   Pt reports she had a procedure done for fibroids last week and was sore so has been laying down resting a lot. Started having pain in right arm around old PIV site but felt like it had migrated to shoulder as well as pain in L leg.   Radial pulses 2+  Pedal pulses 2+  Pt pulses 2+  Pt denies sob today or cp. Pt reports she was sob following the procedure when she got home but figured it was just a side effect of sedation

## 2022-06-05 NOTE — Progress Notes (Signed)
Right UE venous duplex and Left LE venous duplex studies completed. Please see CV Proc for preliminary results. Notified ED RN of results.  Anderson Malta  Kyliah Deanda BS, RVT 06/05/2022 6:51 PM

## 2022-06-05 NOTE — ED Provider Notes (Signed)
Patient Contact: 12:37 PM (approximate)   History   Numbness   HPI  Ashley Orr is a 51 y.o. female status post D&C on 05/30/2022, presents to the urgent care with right upper extremity swelling that radiates to the chest, left calf pain and shortness of breath.  Patient states that she developed shortness of breath shortly after the procedure but states that she did not want to bother anyone with her symptoms.  She denies current chest tightness or chest pain.  No fever at home.      Physical Exam   Triage Vital Signs: ED Triage Vitals  Enc Vitals Group     BP 06/05/22 1213 123/80     Pulse Rate 06/05/22 1213 (S) (!) 114     Resp 06/05/22 1213 (S) (!) 25     Temp 06/05/22 1213 98 F (36.7 C)     Temp Source 06/05/22 1213 Oral     SpO2 06/05/22 1213 99 %     Weight --      Height --      Head Circumference --      Peak Flow --      Pain Score 06/05/22 1212 5     Pain Loc --      Pain Edu? --      Excl. in [place]? --     Most recent vital signs: Vitals:   06/05/22 1213  BP: 123/80  Pulse: (S) (!) 114  Resp: (S) (!) 25  Temp: 98 F (36.7 C)  SpO2: 99%     General: Alert and in no acute distress. Eyes:  PERRL. EOMI. Head: No acute traumatic findings ENT:      Nose: No congestion/rhinnorhea.      Mouth/Throat: Mucous membranes are moist. Neck: No stridor. No cervical spine tenderness to palpation. Cardiovascular:  Good peripheral perfusion Respiratory: Normal respiratory effort without tachypnea or retractions.  Patient tachypneic. Good air entry to the bases with no decreased or absent breath sounds. Gastrointestinal: Bowel sounds 4 quadrants. Soft and nontender to palpation. No guarding or rigidity. No palpable masses. No distention. No CVA tenderness. Musculoskeletal: Full range of motion to all extremities.  Neurologic:  No gross focal neurologic deficits are appreciated.  Skin:   No rash noted Other:   ED Results / Procedures / Treatments    Labs (all labs ordered are listed, but only abnormal results are displayed) Labs Reviewed - No data to display     PROCEDURES:  Critical Care performed: No  Procedures   MEDICATIONS ORDERED IN ED: Medications - No data to display   IMPRESSION / MDM / [place] / ED COURSE  I reviewed the triage vital signs and the nursing notes.                              Assessment and plan SOB Extremity swelling  51 year old female presents to the urgent care with right upper extremity swelling, left calf swelling and shortness of breath.  Patient was tachypneic and tachycardic at triage but vital signs were otherwise reassuring.  On exam, patient was alert, active and nontoxic-appearing.  Given concern for DVT and PE, patient was referred to the emergency department for further care and management.  Patient has a driver and feels comfortable going to the emergency department POV.      FINAL CLINICAL IMPRESSION(S) / ED DIAGNOSES   Final diagnoses:  SOB (shortness of breath)  Leg swelling  Arm swelling     Rx / DC Orders   ED Discharge Orders     None        Note:  This document was prepared using Dragon voice recognition software and may include unintentional dictation errors.   Vallarie Mare [place], [place] 06/05/22 1240

## 2022-06-05 NOTE — Discharge Instructions (Signed)
Please go to emergency department at [facility] or [facility] for further care and management.

## 2022-06-05 NOTE — ED Notes (Signed)
Patient is being discharged from the Urgent Care and sent to the Emergency Department via personal vehicle with family . Per Provider Skeet Simmer, patient is in need of higher level of care due to SOB & leg swelling. Patient is aware and verbalizes understanding of plan of care.   Vitals:   06/05/22 1213  BP: 123/80  Pulse: (S) (!) 114  Resp: (S) (!) 25  Temp: 98 F (36.7 C)  SpO2: 99%

## 2022-06-05 NOTE — ED Notes (Signed)
No answer for vitals x1

## 2022-06-05 NOTE — ED Triage Notes (Signed)
Pt had procedure last week for fibroids. Endorses right arm swelling where the IV was. Also having SOB since Tuesday night. Left leg numbness since Friday. No leg swelling noticed.

## 2022-06-05 NOTE — ED Provider Triage Note (Signed)
Emergency Medicine Provider Triage Evaluation Note  Ashley Orr , a 51 y.o. female  was evaluated in triage.  Pt complains of .  Review of Systems  Positive:  Negative:   Physical Exam  BP 125/79 (BP Location: Left Arm)   Pulse (!) 101   Temp 99.1 F (37.3 C) (Oral)   Resp 18   Ht '5\' 2"'$  (1.575 m)   Wt 95.6 kg   LMP 06/05/2022   SpO2 99%   BMI 38.55 kg/m  Gen:   Awake, no distress   Resp:  Normal effort  MSK:   Moves extremities without difficulty  Other:  Some erythema and swelling noted to RUE. Tenderness to the LLE. Compartments are soft throughout. Sensation intact throughout. Pulses intact.   Medical Decision Making  Medically screening exam initiated at 4:30 PM.  Appropriate orders placed.  Shiya Fogelman was informed that the remainder of the evaluation will be completed by another provider, this initial triage assessment does not replace that evaluation, and the importance of remaining in the ED until their evaluation is complete.  Discussed with my attending on this chart. Will order DVT studies.    Sherrell Puller, [place] 06/05/22 [phone]

## 2022-06-06 ENCOUNTER — Observation Stay (HOSPITAL_BASED_OUTPATIENT_CLINIC_OR_DEPARTMENT_OTHER): Payer: 59

## 2022-06-06 ENCOUNTER — Other Ambulatory Visit (HOSPITAL_COMMUNITY): Payer: Self-pay

## 2022-06-06 DIAGNOSIS — E039 Hypothyroidism, unspecified: Secondary | ICD-10-CM | POA: Diagnosis not present

## 2022-06-06 DIAGNOSIS — I10 Essential (primary) hypertension: Secondary | ICD-10-CM | POA: Diagnosis not present

## 2022-06-06 DIAGNOSIS — R69 Illness, unspecified: Secondary | ICD-10-CM | POA: Diagnosis not present

## 2022-06-06 DIAGNOSIS — I2699 Other pulmonary embolism without acute cor pulmonale: Secondary | ICD-10-CM | POA: Diagnosis present

## 2022-06-06 DIAGNOSIS — I82442 Acute embolism and thrombosis of left tibial vein: Secondary | ICD-10-CM | POA: Diagnosis not present

## 2022-06-06 DIAGNOSIS — N938 Other specified abnormal uterine and vaginal bleeding: Secondary | ICD-10-CM

## 2022-06-06 DIAGNOSIS — I2609 Other pulmonary embolism with acute cor pulmonale: Secondary | ICD-10-CM | POA: Diagnosis not present

## 2022-06-06 DIAGNOSIS — F329 Major depressive disorder, single episode, unspecified: Secondary | ICD-10-CM | POA: Diagnosis present

## 2022-06-06 LAB — COMPREHENSIVE METABOLIC PANEL
ALT: 18 U/L (ref 0–44)
AST: 17 U/L (ref 15–41)
Albumin: 2.7 g/dL — ABNORMAL LOW (ref 3.5–5.0)
Alkaline Phosphatase: 47 U/L (ref 38–126)
Anion gap: 7 (ref 5–15)
BUN: 5 mg/dL — ABNORMAL LOW (ref 6–20)
CO2: 23 mmol/L (ref 22–32)
Calcium: 8.7 mg/dL — ABNORMAL LOW (ref 8.9–10.3)
Chloride: 110 mmol/L (ref 98–111)
Creatinine, Ser: 0.79 mg/dL (ref 0.44–1.00)
GFR, Estimated: >60 mL/min (ref >60)
Glucose, Bld: 105 mg/dL — ABNORMAL HIGH (ref 70–99)
Potassium: 3.1 mmol/L — ABNORMAL LOW (ref 3.5–5.1)
Sodium: 140 mmol/L (ref 135–145)
Total Bilirubin: 0.3 mg/dL (ref 0.3–1.2)
Total Protein: 5.8 g/dL — ABNORMAL LOW (ref 6.5–8.1)

## 2022-06-06 LAB — URINALYSIS, ROUTINE W REFLEX MICROSCOPIC
Bilirubin Urine: NEGATIVE
Glucose, UA: NEGATIVE mg/dL
Ketones, ur: NEGATIVE mg/dL
Leukocytes,Ua: NEGATIVE
Nitrite: NEGATIVE
Protein, ur: NEGATIVE mg/dL
Specific Gravity, Urine: 1.016 (ref 1.005–1.030)
pH: 7 (ref 5.0–8.0)

## 2022-06-06 LAB — RETICULOCYTES
Immature Retic Fract: 34.4 % — ABNORMAL HIGH (ref 2.3–15.9)
RBC.: 3.8 MIL/uL — ABNORMAL LOW (ref 3.87–5.11)
Retic Count, Absolute: 196.1 10*3/uL — ABNORMAL HIGH (ref 19.0–186.0)
Retic Ct Pct: 5.2 % — ABNORMAL HIGH (ref 0.4–3.1)

## 2022-06-06 LAB — CBC WITH DIFFERENTIAL/PLATELET
Abs Immature Granulocytes: 0.03 10*3/uL (ref 0.00–0.07)
Basophils Absolute: 0 10*3/uL (ref 0.0–0.1)
Basophils Relative: 0 %
Eosinophils Absolute: 0.1 10*3/uL (ref 0.0–0.5)
Eosinophils Relative: 2 %
HCT: 27 % — ABNORMAL LOW (ref 36.0–46.0)
Hemoglobin: 7.5 g/dL — ABNORMAL LOW (ref 12.0–15.0)
Immature Granulocytes: 0 %
Lymphocytes Relative: 25 %
Lymphs Abs: 1.7 10*3/uL (ref 0.7–4.0)
MCH: 20.8 pg — ABNORMAL LOW (ref 26.0–34.0)
MCHC: 27.8 g/dL — ABNORMAL LOW (ref 30.0–36.0)
MCV: 74.8 fL — ABNORMAL LOW (ref 80.0–100.0)
Monocytes Absolute: 0.7 10*3/uL (ref 0.1–1.0)
Monocytes Relative: 10 %
Neutro Abs: 4.2 10*3/uL (ref 1.7–7.7)
Neutrophils Relative %: 63 %
Platelets: 231 10*3/uL (ref 150–400)
RBC: 3.61 MIL/uL — ABNORMAL LOW (ref 3.87–5.11)
RDW: 27.8 % — ABNORMAL HIGH (ref 11.5–15.5)
Smear Review: NORMAL
WBC: 6.8 10*3/uL (ref 4.0–10.5)
nRBC: 0.3 % — ABNORMAL HIGH (ref 0.0–0.2)

## 2022-06-06 LAB — HEMOGLOBIN AND HEMATOCRIT, BLOOD
HCT: 28.8 % — ABNORMAL LOW (ref 36.0–46.0)
Hemoglobin: 8.1 g/dL — ABNORMAL LOW (ref 12.0–15.0)

## 2022-06-06 LAB — ECHOCARDIOGRAM COMPLETE
Area-P 1/2: 3.95 cm2
Height: 62 in
S' Lateral: 2.55 cm
Weight: 3372.16 oz

## 2022-06-06 LAB — PROTIME-INR
INR: 1.2 (ref 0.8–1.2)
Prothrombin Time: 14.6 seconds (ref 11.4–15.2)

## 2022-06-06 LAB — MAGNESIUM: Magnesium: 2.1 mg/dL (ref 1.7–2.4)

## 2022-06-06 LAB — APTT: aPTT: 36 seconds (ref 24–36)

## 2022-06-06 MED ORDER — ACETAMINOPHEN 325 MG PO TABS: 650.0000 mg | ORAL_TABLET | Freq: Four times a day (QID) | ORAL | Status: DC | PRN

## 2022-06-06 MED ORDER — ACETAMINOPHEN 650 MG RE SUPP: 650.0000 mg | Freq: Four times a day (QID) | RECTAL | Status: DC | PRN

## 2022-06-06 MED ORDER — IOHEXOL 350 MG/ML SOLN
65.0000 mL | Freq: Once | INTRAVENOUS | Status: AC | PRN
  Administered 2022-06-06: 65 mL via INTRAVENOUS

## 2022-06-06 MED ORDER — SODIUM CHLORIDE 0.9 % IV SOLN
125.0000 mg | Freq: Once | INTRAVENOUS | Status: AC
  Administered 2022-06-06: 125 mg via INTRAVENOUS
  Filled 2022-06-06: qty 10

## 2022-06-06 MED ORDER — LEVOTHYROXINE SODIUM 100 MCG PO TABS
200.0000 ug | ORAL_TABLET | ORAL | Status: DC
  Filled 2022-06-06: qty 2

## 2022-06-06 MED ORDER — VENLAFAXINE HCL ER 37.5 MG PO CP24
37.5000 mg | ORAL_CAPSULE | Freq: Every day | ORAL | Status: DC
  Administered 2022-06-06: 37.5 mg via ORAL
  Filled 2022-06-06: qty 1

## 2022-06-06 MED ORDER — ONDANSETRON HCL 4 MG PO TABS: 4.0000 mg | ORAL_TABLET | Freq: Four times a day (QID) | ORAL | Status: DC | PRN

## 2022-06-06 MED ORDER — APIXABAN (ELIQUIS) VTE STARTER PACK (10MG AND 5MG): ORAL_TABLET | ORAL | 0 refills | Status: DC

## 2022-06-06 MED ORDER — LEVOTHYROXINE SODIUM 75 MCG PO TABS
175.0000 ug | ORAL_TABLET | ORAL | Status: DC
  Administered 2022-06-06: 175 ug via ORAL
  Filled 2022-06-06: qty 1

## 2022-06-06 MED ORDER — BUPROPION HCL ER (SR) 150 MG PO TB12
150.0000 mg | ORAL_TABLET | Freq: Every day | ORAL | Status: DC
  Administered 2022-06-06: 150 mg via ORAL
  Filled 2022-06-06: qty 1

## 2022-06-06 MED ORDER — HYDROCHLOROTHIAZIDE 25 MG PO TABS
25.0000 mg | ORAL_TABLET | Freq: Every day | ORAL | Status: DC
  Administered 2022-06-06: 25 mg via ORAL
  Filled 2022-06-06: qty 1

## 2022-06-06 MED ORDER — POLYETHYLENE GLYCOL 3350 17 GM/SCOOP PO POWD
17.0000 g | Freq: Every day | ORAL | 0 refills | Status: AC | PRN
Start: 2022-06-06 — End: ?
  Filled 2022-06-06: qty 238, 14d supply, fill #0

## 2022-06-06 MED ORDER — POTASSIUM CHLORIDE CRYS ER 20 MEQ PO TBCR
40.0000 meq | EXTENDED_RELEASE_TABLET | Freq: Once | ORAL | Status: AC
Start: 2022-06-06 — End: 2022-06-06
  Administered 2022-06-06: 40 meq via ORAL
  Filled 2022-06-06: qty 2

## 2022-06-06 MED ORDER — TEMAZEPAM 30 MG PO CAPS: 30.0000 mg | ORAL_CAPSULE | Freq: Every evening | ORAL | Status: DC | PRN

## 2022-06-06 MED ORDER — ONDANSETRON HCL 4 MG PO TABS
4.0000 mg | ORAL_TABLET | Freq: Four times a day (QID) | ORAL | 0 refills | Status: DC | PRN
  Filled 2022-06-06: qty 18, 5d supply, fill #0

## 2022-06-06 MED ORDER — ONDANSETRON HCL 4 MG/2ML IJ SOLN: 4.0000 mg | Freq: Four times a day (QID) | INTRAMUSCULAR | Status: DC | PRN

## 2022-06-06 MED ORDER — FERROUS SULFATE 325 (65 FE) MG PO TABS
325.0000 mg | ORAL_TABLET | Freq: Every day | ORAL | Status: DC
  Administered 2022-06-06: 325 mg via ORAL
  Filled 2022-06-06: qty 1

## 2022-06-06 MED ORDER — POLYETHYLENE GLYCOL 3350 17 G PO PACK: 17.0000 g | PACK | Freq: Every day | ORAL | Status: DC | PRN

## 2022-06-06 MED ORDER — OXYCODONE-ACETAMINOPHEN 5-325 MG PO TABS: 1.0000 | ORAL_TABLET | Freq: Four times a day (QID) | ORAL | Status: DC | PRN

## 2022-06-06 MED ORDER — CITALOPRAM HYDROBROMIDE 10 MG PO TABS
40.0000 mg | ORAL_TABLET | Freq: Every day | ORAL | Status: DC
  Administered 2022-06-06: 40 mg via ORAL
  Filled 2022-06-06: qty 4

## 2022-06-06 MED ORDER — LOSARTAN POTASSIUM 50 MG PO TABS
50.0000 mg | ORAL_TABLET | Freq: Every day | ORAL | Status: DC
  Administered 2022-06-06: 50 mg via ORAL
  Filled 2022-06-06: qty 1

## 2022-06-06 MED ORDER — APIXABAN (ELIQUIS) VTE STARTER PACK (10MG AND 5MG)
ORAL_TABLET | ORAL | 0 refills | Status: DC
  Filled 2022-06-06: qty 74, 30d supply, fill #0

## 2022-06-06 MED ORDER — TRAZODONE HCL 50 MG PO TABS: 100.0000 mg | ORAL_TABLET | Freq: Every day | ORAL | Status: DC

## 2022-06-06 NOTE — ED Notes (Signed)
Breakfast order placed ?

## 2022-06-06 NOTE — Assessment & Plan Note (Signed)
.   Resume home regimen of Synthroid in alternating doses of 175 mcg one day and 200 mcg other days

## 2022-06-06 NOTE — Assessment & Plan Note (Signed)
   Patient's procedure earlier in the month was unsucessful and therefore she will continue to be at risk of ongoing dysfunctional uterine bleeding.   Likely more so now that she is on Eliquis.  Patient to remain vigilant for signs of heavy bleeding  Patient to follow closely as an outpatient with GYN for management and further potential procedures.

## 2022-06-06 NOTE — Assessment & Plan Note (Addendum)
   CT angiogram reveals evidence of acute pulmonary emboli   Etiology is likely related to recent endometrial ablation attempt on 7/11  No evidence of right heart strain on CT imaging  Patient has been placed on Eliquis by the ER provider which will be continued at this time  Echocardiogram will be obtained in the morning   Monitoring patient on telemetry  Monitoring cardiac enzymes.   Supplemental oxygen for bouts of hypoxia  As needed opiate-based analgesics for associated chest pain

## 2022-06-06 NOTE — Progress Notes (Signed)
Care started prior to midnight in the emergency room and patient was admitted early this morning after midnight by Dr. Inda Merlin and I am in current agreement with his assessment and plan.  Additional changes to plan of care been made currently.  Patient is a obese African-American female with a past medical history significant for but limited to hypertension, hypothyroidism, chronic iron deficiency anemia secondary to dysfunctional uterine bleeding and uterine fibroids, anxiety and depression who presented to the [facility], ED with complaints of right upper extremity edema and shortness of breath.  She underwent a hysteroscopy and endometrial ablation attempt on 05/30/2022 with Dr. Kennon Rounds but however the ablation was unfortunate not successful.  In the days that followed the patient felt that she is recovering normally but for last 5 or so days she began to experience right proximal extremity swelling and at some point also noticed that she was experiencing shortness of breath worse with exertion and improving with rest.  Given that the symptoms of shortness of breath continue to worsen with associated edema on the right upper extremity she eventually presented to the [facility] as well for urgent care and in the ED she was evaluated in right upper extremity revealed a superficial thrombus of the right cephalic vein.  Ultrasound of the lower extremities revealed DVT of the left posterior tibial vein and a CTA of the chest was done and revealed acute PE and right lower lobe and left upper lobe.  She was initiated on anticoagulation with Eliquis and was admitted for further work-up and monitoring.  Echocardiogram was done showed a normal EF of 65 to 70% with no regional wall motion abnormalities and the left ventricular diastolic parameters were normal as well as the right ventricular systolic function being normal.  Her blood count did drop a little bit from 8.6/30.6-7.5/27.0 given her high risk of  bleeding and initiation of anticoagulation she will be observed overnight and she is currently being treated for the following but not limited to:  Acute pulmonary embolism without acute cor pulmonale (HCC) -CT angiogram reveals evidence of acute pulmonary emboli  -Etiology is likely related to recent endometrial ablation attempt on 7/11 -No evidence of right heart strain on CT imaging -Patient has been placed on Eliquis by the ER provider which will be continued at this time with close monitoring of bleeding -Echocardiogram showed a normal right systolic ventricular function and a EF of 65 to 70% -Continuing to monitor patient on telemetry overnight -Monitoring cardiac enzymes.  -Supplemental oxygen for bouts of hypoxia -SpO2: 100 % -As needed opiate-based analgesics for associated chest pain -PESI class I and is very low risk 30-day mortality in this group but given her high risk for further bleeding we will observe overnight given that she is being started on oral anticoagulation with close monitoring of her hemoglobin/hematocrit -She will need an ambulatory home O2 screen prior to discharge -Case was discussed with oncology who will follow patient in outpatient clinic for outpatient follow-up   Acute deep vein thrombosis (DVT) of tibial vein of left lower extremity ([place]) -Notable left lower extremity DVT on ultrasound which has developed concurrently with the patient's pulmonary emboli -Etiology is recent endometrial ablation attempt on 7/11 -Please see remainder of assessment and plan above   Dysfunctional Uterine Bleeding -Patient's procedure earlier in the month was unsucessful and therefore she will continue to be at risk of ongoing dysfunctional uterine bleeding.   Likely more so now that she is on Eliquis. -Patient  to remain vigilant for signs of heavy bleeding now that she is being started on anticoagulation with apixaban -Patient to follow closely as an outpatient with GYN for  management and further potential procedures.    Major Depressive Disorder -Continuing psychotropic medication regimen with bupropion 150 mg p.o. daily, citalopram 40 mg p.o. daily, temazepam 30 mg p.o. nightly as needed sleep, trazodone 100 mg p.o. nightly, and venlafaxine Exar 37.5 mg p.o. daily   Essential hypertension -Resume patients home regimen of oral antihypertensives but will hold her hydrochlorothiazide -Continuing losartan 50 g p.o. daily -Titrate antihypertensive regimen as necessary to achieve adequate BP control -PRN intravenous antihypertensives for excessively elevated blood pressure -Continue monitor blood pressures per protocol -Last blood pressure reading was  Acute on chronic normocytic anemia -Patient's hemoglobin/hematocrit went from 8.6/30.6 is now 7.5/27.0 with an MCV of 74.8 -Check iron studies and anemia panel and discussed the case with oncology who recommended a dose of IV iron which has been initiated -Check hemoglobin/hematocrit every 6h for now -Continue with p.o. iron supplements -Given her high risk of bleeding will need to continue to monitor for signs and symptoms of bleeding now the anticoagulation has been started; no overt bleeding currently and will observe overnight and if hemoglobin stable can be discharged in the a.m. on oral anticoagulation -It is unclear why she got ketorolac overnight IV -Repeat CBC in a.m.  Hypokalemia -Patient's potassium was 2.7 improved to 3.1 -Replete yesterday with p.o. potassium chloride 40 mill colons x1 which is going to be repeated as well as 50 mill colons of IV Kcl -We will hold her hydrochlorothiazide for now -Mag level was 2.1 -Continue to monitor and replete as necessary -Repeat CMP in the a.m.  Acquired Hypothyroidism -Check TSH in the a.m. -Continue home regimen with 175 mcg every other day and then 200 mcg every other day  Hypoalbuminemia -Albumin level has gone from 3.4 is now 2.7 -Continue monitor  trend and repeat CMP in the a.m.  We will continue to monitor patient's clinical response to intervention and repeat blood work in the a.m. and continue to monitor for signs and symptoms of bleeding; she had no overt bleeding noted and hemoglobin was stable on oral anticoagulation can likely be discharged home tomorrow morning with outpatient Hematology follow-up and GYN for further management of her dysphagia and bleeding

## 2022-06-06 NOTE — Assessment & Plan Note (Signed)
.   Resume patients home regimen of oral antihypertensives . Titrate antihypertensive regimen as necessary to achieve adequate BP control . PRN intravenous antihypertensives for excessively elevated blood pressure   

## 2022-06-06 NOTE — Discharge Instructions (Addendum)
You have blood clots in a deep vein of your left leg, a superficial vein in your right arm, and in both lungs.  Treatment for this is a blood thinning medication.  You received your first dose of this in the emergency department.  A prescription for continued treatment was sent to your pharmacy.  Continue this medication, starting tomorrow morning.  Your starter pack will get you through the first month but you will need a prescription refill that you should obtain through your primary care doctor.  This medication does shift the balance between bleeding and clotting and may cause worsening of your vaginal bleeding.  Please return to the emergency department if you experience any of the following: Worsening severity of bleeding, worsening shortness of breath, feeling like you are going to pass out, chest pain, or any other concerning symptoms.  Otherwise, please follow-up with your outpatient doctors for continued care.  Information on my medicine - ELIQUIS (apixaban)  This medication education was reviewed with me or my healthcare representative as part of my  discharge preparation.  WHY WAS ELIQUIS PRESCRIBED FOR YOU?  Eliquis was prescribed to treat blood clots that may have been found in the veins of your legs  (deep vein thrombosis) or in your lungs (pulmonary embolism) and to reduce the risk of them  occurring again. WHAT DO YOU NEED TO KNOW ABOUT ELIQUIS ?  The starting dose is 10 mg (two 5 mg tablets) taken TWICE daily for the FIRST SEVEN (7)  DAYS, then on 7/24 the EVENING dose is reduced to ONE 5 mg tablet taken  TWICE daily. Eliquis may be taken with or without food.  Try to take the dose about the same time in the morning and in the evening. If you have difficulty  swallowing the tablet whole please discuss with your pharmacist how to take the medication  safely. Take Eliquis exactly as prescribed and DO NOT stop taking Eliquis without talking to the doctor  who prescribed the  medication. Stopping may increase your risk of developing a new blood  clot. Refill your prescription before you run out.  After discharge, you should have regular check-up appointments with your healthcare  provider that is prescribing your Eliquis.  WHAT DO YOU DO IF YOU MISS A DOSE? If a dose of ELIQUIS is not taken at the scheduled time, take it as soon as possible on the same  day and twice-daily administration should be resumed. The dose should not be doubled to make  up for a missed dose.   IMPORTANT SAFETY INFORMATION A possible side effect of Eliquis is bleeding. You should call your healthcare provider right away  if you experience any of the following:  ? Bleeding from an injury or your nose that does not stop. ? Unusual colored urine (red or dark brown) or unusual colored stools (red or black). ? Unusual bruising for unknown reasons. ? A serious fall or if you hit your head (even if there is no bleeding). Some medicines may interact with Eliquis and might increase your risk of bleeding or clotting  while on Eliquis. To help avoid this, consult your healthcare provider or pharmacist prior to using  any new prescription or non-prescription medications, including herbals, vitamins, non-steroidal  anti-inflammatory drugs (NSAIDs) and supplements.  This website has more information on Eliquis (apixaban): [email or web address].

## 2022-06-06 NOTE — ED Notes (Signed)
Pt noted to be ambulating down the hallway towards the bathroom. Pt appears to be tolerating it well.

## 2022-06-06 NOTE — Assessment & Plan Note (Signed)
   Continuing psychotropic medication regimen

## 2022-06-06 NOTE — Assessment & Plan Note (Addendum)
   Notable left lower extremity DVT on ultrasound which has developed concurrently with the patient's pulmonary emboli  Etiology is recent endometrial ablation attempt on 7/11  Please see remainder of assessment and plan above

## 2022-06-06 NOTE — Discharge Summary (Signed)
Physician Discharge Summary   Patient: Ashley Orr MRN: [id number] DOB: 1971-06-11  Admit date:     06/05/2022  Discharge date: 06/06/22  Discharge Physician: Raiford Noble, DO   PCP: Camillia Herter, NP   Recommendations at discharge:   Follow up with PCP within 1-2 weeks and repeat CBC, CMP, Mag, Phos within 1 week Follow-up with hematology Dr. Alvy Bimler in outpatient setting for hypercoagulable evaluation and further management of thrombotic events  Discharge Diagnoses: Principal Problem:   Acute pulmonary embolism without acute cor pulmonale (HCC) Active Problems:   Acute deep vein thrombosis (DVT) of tibial vein of left lower extremity (HCC)   Dysfunctional uterine bleeding   Acquired hypothyroidism   Major depressive disorder   Essential hypertension  Resolved Problems:   * No resolved hospital problems. [facility] Course: Care started prior to midnight in the emergency room and patient was admitted early this morning after midnight by Dr. Inda Merlin and I am in current agreement with his assessment and plan.  Additional changes to plan of care been made currently.  Patient is a obese African-American female with a past medical history significant for but limited to hypertension, hypothyroidism, chronic iron deficiency anemia secondary to dysfunctional uterine bleeding and uterine fibroids, anxiety and depression who presented to the [facility], ED with complaints of right upper extremity edema and shortness of breath.  She underwent a hysteroscopy and endometrial ablation attempt on 05/30/2022 with Dr. Kennon Rounds but however the ablation was unfortunate not successful.  In the days that followed the patient felt that she is recovering normally but for last 5 or so days she began to experience right proximal extremity swelling and at some point also noticed that she was experiencing shortness of breath worse with exertion and improving with rest.  Given that the symptoms of  shortness of breath continue to worsen with associated edema on the right upper extremity she eventually presented to the [facility] as well for urgent care and in the ED she was evaluated in right upper extremity revealed a superficial thrombus of the right cephalic vein.  Ultrasound of the lower extremities revealed DVT of the left posterior tibial vein and a CTA of the chest was done and revealed acute PE and right lower lobe and left upper lobe.  She was initiated on anticoagulation with Eliquis and was admitted for further work-up and monitoring.  Echocardiogram was done showed a normal EF of 65 to 70% with no regional wall motion abnormalities and the left ventricular diastolic parameters were normal as well as the right ventricular systolic function being normal.  Her blood count did drop a little bit from 8.6/30.6-7.5/27.0 given her high risk of bleeding and initiation of anticoagulation she will be observed overnight and she is currently being treated for the following but not limited to:  Assessment and Plan:  Acute pulmonary embolism without acute cor pulmonale (HCC) -CT angiogram reveals evidence of acute pulmonary emboli  -Etiology is likely related to recent endometrial ablation attempt on 7/11 -No evidence of right heart strain on CT imaging -Patient has been placed on Eliquis by the ER provider which will be continued at this time with close monitoring of bleeding -Echocardiogram showed a normal right systolic ventricular function and a EF of 65 to 70% -Continuing to monitor patient on telemetry overnight -Monitoring cardiac enzymes.  -Supplemental oxygen for bouts of hypoxia -SpO2: 100 % -As needed opiate-based analgesics for associated chest pain -PESI class I and is very  low risk 30-day mortality in this group but given her high risk for further bleeding we were planning on observing overnight given that she is being started on oral anticoagulation with close monitoring of  her hemoglobin/hematocrit but her repeat hemoglobin/hematocrit improved and she is stable for discharge at this time with close PCP follow-up and outpatient hematology follow-up so we will discharge her home and have her follow-up -She will need an ambulatory home O2 screen prior to discharge and she did not desaturate -Case was discussed with oncology who will follow patient in outpatient clinic for outpatient follow-up   Acute deep vein thrombosis (DVT) of tibial vein of left lower extremity ([place]) -Notable left lower extremity DVT on ultrasound which has developed concurrently with the patient's pulmonary emboli -Etiology is recent endometrial ablation attempt on 7/11 -Please see remainder of assessment and plan above   Dysfunctional Uterine Bleeding -Patient's procedure earlier in the month was unsucessful and therefore she will continue to be at risk of ongoing dysfunctional uterine bleeding.   Likely more so now that she is on Eliquis. -Patient to remain vigilant for signs of heavy bleeding now that she is being started on anticoagulation with apixaban -Patient to follow closely as an outpatient with GYN for management and further potential procedures.    Major Depressive Disorder -Continuing psychotropic medication regimen with bupropion 150 mg p.o. daily, citalopram 40 mg p.o. daily, temazepam 30 mg p.o. nightly as needed sleep, trazodone 100 mg p.o. nightly, and venlafaxine Exar 37.5 mg p.o. daily   Essential hypertension -Resume patients home regimen of oral antihypertensives but will hold her hydrochlorothiazide -Continuing losartan 50 g p.o. daily -Titrate antihypertensive regimen as necessary to achieve adequate BP control -PRN intravenous antihypertensives for excessively elevated blood pressure -Continue monitor blood pressures per protocol -Last blood pressure reading was   Acute on chronic normocytic anemia -Patient's hemoglobin/hematocrit went from 8.6/30.6 is now 7.5/27.0  with an MCV of 74.8 but prior to discharge it was repeated and improved to 8.1/28.8 and she has no overt signs of bleeding -Check iron studies and anemia panel and discussed the case with oncology who recommended a dose of IV iron which has been initiated -Check hemoglobin/hematocrit every 6h for now -Continue with p.o. iron supplements -Given her high risk of bleeding we were planning on observing overnight but she improved and did not want to stay overnight and given that her hemoglobin hematocrit is improved and she did not desaturate we will discharge her home on anticoagulation and stop her norethindrone and have her follow-up with her gynecologist and follow-up with hematology for further evaluation and anticoagulation recommendations -It is unclear why she got ketorolac overnight IV -Repeat CBC in a.m.   Hypokalemia -Patient's potassium was 2.7 improved to 3.1 -Replete yesterday with p.o. potassium chloride 40 mill colons x1 which is going to be repeated as well as 50 mill colons of IV Kcl -We will hold her hydrochlorothiazide for now -Mag level was 2.1 -Continue to monitor and replete as necessary -Repeat CMP within 1 week   Acquired Hypothyroidism -Check TSH within 1 week -Continue home regimen with 175 mcg every other day and then 200 mcg every other day   Hypoalbuminemia -Albumin level has gone from 3.4 is now 2.7 -Continue monitor trend and repeat CMP within 1 week  Obesity -Complicates overall prognosis and care -Estimated body mass index is 38.55 kg/m as calculated from the following:   Height as of this encounter: '5\' 2"'$  (1.575 m).   Weight as of  this encounter: 95.6 kg.  -Weight Loss and Dietary Counseling given  Consultants: None but case was discussed with Hematology Procedures performed: ECHOCardiogram   Disposition: Home Diet recommendation:  Discharge Diet Orders (From admission, onward)     Start     Ordered   06/06/22 0000  Diet - low sodium heart healthy         06/06/22 1541           Cardiac diet DISCHARGE MEDICATION: Allergies as of 06/06/2022       Reactions   Codeine Anaphylaxis        Medication List     STOP taking these medications    norethindrone 5 MG tablet Commonly known as: Aygestin       TAKE these medications    buPROPion 150 MG 12 hr tablet Commonly known as: WELLBUTRIN SR TAKE 1 TABLET BY MOUTH EVERY DAY   CASCARA SAGRADA PO Take 1 tablet by mouth daily as needed (Constipation).   Cholecalciferol 50 MCG (2000 UT) Caps Take 2,000 Units by mouth daily.   citalopram 40 MG tablet Commonly known as: CELEXA TAKE 1 TABLET BY MOUTH EVERY DAY What changed:  when to take this reasons to take this   Cranberry 500 MG Caps Take 2 capsules by mouth daily. Probiotic-Prebiotic With vitamin C 60 mg   cyclobenzaprine 10 MG tablet Commonly known as: FLEXERIL Take 1 tablet (10 mg total) by mouth 3 (three) times daily as needed for muscle spasms.   Eliquis DVT/PE Starter Pack Generic drug: Apixaban Starter Pack ('10mg'$  and '5mg'$ ) Take as directed on package: start with two-'5mg'$  tablets twice daily for 7 days. On day 8, switch to one-'5mg'$  tablet twice daily.   ferrous sulfate 325 (65 FE) MG tablet Take 325 mg by mouth daily with breakfast.   hydrochlorothiazide 25 MG tablet Commonly known as: HYDRODIURIL Take 1 tablet (25 mg total) by mouth daily.   levothyroxine 200 MCG tablet Commonly known as: SYNTHROID Take 1 tablet (200 mcg total) by mouth See admin instructions. Alternating with 175 mcg every other day.   levothyroxine 175 MCG tablet Commonly known as: SYNTHROID Take 1 tablet (175 mcg total) by mouth See admin instructions. Alternating with 200 mcg every other day   losartan 50 MG tablet Commonly known as: COZAAR Take 1 tablet (50 mg total) by mouth daily.   ondansetron 4 MG tablet Commonly known as: ZOFRAN Take 1 tablet (4 mg total) by mouth every 6 (six) hours as needed for nausea.   OVER  THE COUNTER MEDICATION Take 1 Scoop by mouth daily. Total Beets Juice 16 oz with water   phentermine 37.5 MG capsule Take 1 capsule (37.5 mg total) by mouth every morning.   polyethylene glycol powder 17 GM/SCOOP powder Commonly known as: GLYCOLAX/MIRALAX Take 17 g by mouth daily as needed for mild constipation.   temazepam 30 MG capsule Commonly known as: RESTORIL Take 30 mg by mouth at bedtime as needed for sleep.   traZODone 100 MG tablet Commonly known as: DESYREL Take 1 tablet (100 mg total) by mouth at bedtime.   Venlafaxine HCl 37.5 MG Tb24 Take 37.5 mg by mouth daily.   Vitamin B-12 5000 MCG Subl Place 5,000 mcg under the tongue daily.   VITAMIN C PO Take 60 mg by mouth daily.        Follow-up Information     Schedule an appointment as soon as possible for a visit  with [name], MD.   Specialty: Obstetrics  and Gynecology Contact information: [place] [address] [phone]         Schedule an appointment as soon as possible for a visit  with Camillia Herter, NP.   Specialty: Nurse Practitioner Contact information: [place] [address] [phone]         Go to  [facility].   Specialty: Emergency Medicine Why: If symptoms worsen Contact information: [address] [id number] [place] [phone]               Discharge Exam: Danley Danker Weights   06/05/22 1541  Weight: 95.6 kg   Vitals:   06/06/22 1630 06/06/22 1651  BP: (!) 139/91   Pulse: 95   Resp: (!) 21   Temp:  97.7 F (36.5 C)  SpO2: 100%    Examination: Physical Exam:  Constitutional: WN/WD obese African-American female currently no acute distress Respiratory: Diminished to auscultation bilaterally, no wheezing, rales, rhonchi or crackles. Normal respiratory effort and patient is not tachypenic. No accessory muscle use.  Unlabored  breathing Cardiovascular: RRR, no murmurs / rubs / gallops. S1 and S2 auscultated.  Has some more extremity edema as well as some arm swelling.  Abdomen: Soft, non-tender, distended secondary body habitus. Bowel sounds positive.  GU: Deferred. Musculoskeletal: No clubbing / cyanosis of digits/nails. No joint deformity upper and lower extremities.  Skin: No rashes, lesions, ulcers on limited skin evaluation. No induration; Warm and dry.  Neurologic: CN 2-12 grossly intact with no focal deficits. Romberg sign and cerebellar reflexes not assessed.  Psychiatric: Normal judgment and insight. Alert and oriented x 3. Normal mood and appropriate affect.   Condition at discharge: stable  The results of significant diagnostics from this hospitalization (including imaging, microbiology, ancillary and laboratory) are listed below for reference.   Imaging Studies: ECHOCARDIOGRAM COMPLETE  Result Date: 06/06/2022    ECHOCARDIOGRAM REPORT   Patient Name:   [name] Date of Exam: 06/06/2022 Medical Rec #:  [id number]                Height:       62.0 in Accession #:    [id number]               Weight:       210.8 lb Date of Birth:  02-01-1971                BSA:          1.955 m Patient Age:    44 years                 BP:           107/68 mmHg Patient Gender: F                        HR:           94 bpm. Exam Location:  Inpatient Procedure: 2D Echo, Cardiac Doppler and Color Doppler Indications:    Pulmonary Embolus I26.09  History:        Patient has no prior history of Echocardiogram examinations.                 Risk Factors:Hypertension. Acute Pulmonary Embolus without acute                 cor pulmonale  Acute deep vein thrombosis of tibial vein of left lower                 extremity.  Sonographer:    Ronny Flurry Sonographer#2:  Danne Baxter RDCS, FE, PE Referring Phys: [id number] [place]  1. Left ventricular ejection fraction, by estimation, is 65 to  70%. The left ventricle has normal function. The left ventricle has no regional wall motion abnormalities. There is mild concentric left ventricular hypertrophy. Left ventricular diastolic parameters were normal.  2. Right ventricular systolic function is normal. The right ventricular size is normal. Tricuspid regurgitation signal is inadequate for assessing PA pressure.  3. The mitral valve is grossly normal. Trivial mitral valve regurgitation. No evidence of mitral stenosis.  4. The aortic valve is tricuspid. Aortic valve regurgitation is not visualized. No aortic stenosis is present.  5. The inferior vena cava is normal in size with greater than 50% respiratory variability, suggesting right atrial pressure of 3 mmHg. FINDINGS  Left Ventricle: Left ventricular ejection fraction, by estimation, is 65 to 70%. The left ventricle has normal function. The left ventricle has no regional wall motion abnormalities. The left ventricular internal cavity size was normal in size. There is  mild concentric left ventricular hypertrophy. Left ventricular diastolic parameters were normal. Right Ventricle: The right ventricular size is normal. No increase in right ventricular wall thickness. Right ventricular systolic function is normal. Tricuspid regurgitation signal is inadequate for assessing PA pressure. Left Atrium: Left atrial size was normal in size. Right Atrium: Right atrial size was normal in size. Prominent Crista terminalis. Pericardium: There is no evidence of pericardial effusion. Mitral Valve: The mitral valve is grossly normal. Trivial mitral valve regurgitation. No evidence of mitral valve stenosis. Tricuspid Valve: The tricuspid valve is grossly normal. Tricuspid valve regurgitation is trivial. No evidence of tricuspid stenosis. Aortic Valve: The aortic valve is tricuspid. Aortic valve regurgitation is not visualized. No aortic stenosis is present. Pulmonic Valve: The pulmonic valve was grossly normal. Pulmonic  valve regurgitation is not visualized. No evidence of pulmonic stenosis. Aorta: The aortic root and ascending aorta are structurally normal, with no evidence of dilitation. Venous: The inferior vena cava is normal in size with greater than 50% respiratory variability, suggesting right atrial pressure of 3 mmHg. IAS/Shunts: The atrial septum is grossly normal.  LEFT VENTRICLE PLAX 2D LVIDd:         3.65 cm   Diastology LVIDs:         2.55 cm   LV e' medial:    6.85 cm/s LV PW:         1.20 cm   LV E/e' medial:  11.3 LV IVS:        1.20 cm   LV e' lateral:   10.70 cm/s LVOT diam:     2.40 cm   LV E/e' lateral: 7.3 LV SV:         128 LV SV Index:   65 LVOT Area:     4.52 cm  RIGHT VENTRICLE RV S prime:     10.80 cm/s TAPSE (M-mode): 1.6 cm LEFT ATRIUM             Index        RIGHT ATRIUM           Index LA diam:        4.10 cm 2.10 cm/m   RA Area:     19.20 cm LA Vol (A2C):   76.3 ml 39.03  ml/m  RA Volume:   55.80 ml  28.55 ml/m LA Vol (A4C):   53.5 ml 27.37 ml/m LA Biplane Vol: 64.9 ml 33.20 ml/m  AORTIC VALVE LVOT Vmax:   131.00 cm/s LVOT Vmean:  97.500 cm/s LVOT VTI:    0.282 m  AORTA Ao Root diam: 3.00 cm Ao Asc diam:  3.30 cm MITRAL VALVE MV Area (PHT): 3.95 cm    SHUNTS MV Decel Time: 192 msec    Systemic VTI:  0.28 m MV E velocity: 77.70 cm/s  Systemic Diam: 2.40 cm MV A velocity: 92.40 cm/s MV E/A ratio:  0.84 Eleonore Chiquito MD Electronically signed by Eleonore Chiquito MD Signature Date/Time: 06/06/2022/9:39:31 AM    Final    UE Venous Duplex ([facility] and [facility] ONLY)  Result Date: 06/06/2022 UPPER VENOUS STUDY  Patient Name:  [name]  Date of Exam:   06/05/2022 Medical Rec #: [id number]                 Accession #:    [id number] Date of Birth: [birth year]                 Patient Gender: F Patient Age:   30 years Exam Location:  [facility] Procedure:      VAS [name] UPPER EXTREMITY VENOUS DUPLEX Referring Phys: RILEY RANSOM  --------------------------------------------------------------------------------  Indications: Post-op, and pain Performing Technologist: Bobetta Lime BS, RVT  Examination Guidelines: A complete evaluation includes B-mode imaging, spectral Doppler, color Doppler, and power Doppler as needed of all accessible portions of each vessel. Bilateral testing is considered an integral part of a complete examination. Limited examinations for reoccurring indications may be performed as noted.  Right Findings: +----------+------------+---------+-----------+----------+-------+ RIGHT     CompressiblePhasicitySpontaneousPropertiesSummary +----------+------------+---------+-----------+----------+-------+ IJV           Full       Yes       Yes                      +----------+------------+---------+-----------+----------+-------+ Subclavian    Full       Yes       Yes                      +----------+------------+---------+-----------+----------+-------+ Axillary      Full       Yes       Yes                      +----------+------------+---------+-----------+----------+-------+ Brachial      Full                                          +----------+------------+---------+-----------+----------+-------+ Radial        Full                                          +----------+------------+---------+-----------+----------+-------+ Ulnar         Full                                          +----------+------------+---------+-----------+----------+-------+ Cephalic      None       No  No                       +----------+------------+---------+-----------+----------+-------+ Basilic       Full                                          +----------+------------+---------+-----------+----------+-------+ The right cephalic vein is thrombosed throughout. Could not follow to its origin as it coursed under the clavicle.  Left Findings:  +----------+------------+---------+-----------+----------+-------+ LEFT      CompressiblePhasicitySpontaneousPropertiesSummary +----------+------------+---------+-----------+----------+-------+ Subclavian    Full       Yes       Yes                      +----------+------------+---------+-----------+----------+-------+  Summary:  Right: No evidence of deep vein thrombosis in the upper extremity. Findings consistent with acute superficial vein thrombosis involving the right cephalic vein.  *See table(s) above for measurements and observations.  Diagnosing physician: Deitra Mayo MD Electronically signed by Deitra Mayo MD on 06/06/2022 at 7:38:21 AM.    Final    VAS [name] LOWER EXTREMITY VENOUS (DVT) (ONLY [facility] & [facility])  Result Date: 06/06/2022  Lower Venous DVT Study Patient Name:  SUZZANNE BRUNKHORST  Date of Exam:   06/05/2022 Medical Rec #: [id number]                 Accession #:    [id number] Date of Birth: 03/17/1971                 Patient Gender: F Patient Age:   23 years Exam Location:  [facility] [place] Procedure:      VAS [name] LOWER EXTREMITY VENOUS (DVT) Referring Phys: RILEY RANSOM --------------------------------------------------------------------------------  Indications: Pain.  Comparison Study: No previous exam noted. Performing Technologist: Bobetta Lime BS, RVT  Examination Guidelines: A complete evaluation includes B-mode imaging, spectral Doppler, color Doppler, and power Doppler as needed of all accessible portions of each vessel. Bilateral testing is considered an integral part of a complete examination. Limited examinations for reoccurring indications may be performed as noted. The reflux portion of the exam is performed with the patient in reverse Trendelenburg.  +-----+---------------+---------+-----------+----------+--------------+ RIGHTCompressibilityPhasicitySpontaneityPropertiesThrombus Aging  +-----+---------------+---------+-----------+----------+--------------+ CFV  Full           Yes      Yes                                 +-----+---------------+---------+-----------+----------+--------------+   +---------+---------------+---------+-----------+----------+--------------+ LEFT     CompressibilityPhasicitySpontaneityPropertiesThrombus Aging +---------+---------------+---------+-----------+----------+--------------+ CFV      Full           Yes      Yes                                 +---------+---------------+---------+-----------+----------+--------------+ SFJ      Full                                                        +---------+---------------+---------+-----------+----------+--------------+ FV Prox  Full                                                        +---------+---------------+---------+-----------+----------+--------------+  FV Mid   Full                                                        +---------+---------------+---------+-----------+----------+--------------+ FV DistalFull                                                        +---------+---------------+---------+-----------+----------+--------------+ PFV      Full                                                        +---------+---------------+---------+-----------+----------+--------------+ POP      Full           Yes      Yes                                 +---------+---------------+---------+-----------+----------+--------------+ PTV      None           No       No                                  +---------+---------------+---------+-----------+----------+--------------+ PERO     Full                                                        +---------+---------------+---------+-----------+----------+--------------+    Summary: RIGHT: - No evidence of common femoral vein obstruction.  LEFT: - Findings consistent with acute deep vein thrombosis  involving the left posterior tibial veins. - No cystic structure found in the popliteal fossa.  *See table(s) above for measurements and observations. Electronically signed by Deitra Mayo MD on 06/06/2022 at 7:37:58 AM.    Final    CT Angio Chest PE W and/or Wo Contrast  Result Date: 06/06/2022 CLINICAL DATA:  Concern for pulmonary embolism. EXAM: CT ANGIOGRAPHY CHEST WITH CONTRAST TECHNIQUE: Multidetector CT imaging of the chest was performed using the standard protocol during bolus administration of intravenous contrast. Multiplanar CT image reconstructions and MIPs were obtained to evaluate the vascular anatomy. RADIATION DOSE REDUCTION: This exam was performed according to the departmental dose-optimization program which includes automated exposure control, adjustment of the mA and/or kV according to patient size and/or use of iterative reconstruction technique. CONTRAST:  13m OMNIPAQUE IOHEXOL 350 MG/ML SOLN COMPARISON:  None Available. FINDINGS: Cardiovascular: There is no cardiomegaly or pericardial effusion. The thoracic aorta is unremarkable. The origins of the great vessels of the aortic arch appear patent. Small right lower lobe segmental and subsegmental pulmonary artery emboli noted. Additional segmental or subsegmental emboli in the left upper lobe. No CT evidence of right heart straining. Mediastinum/Nodes: No hilar or mediastinal adenopathy. The esophagus are grossly unremarkable. No mediastinal fluid collection. Lungs/Pleura: No focal  consolidation, pleural effusion, pneumothorax. The central airways are patent. Upper Abdomen: No acute abnormality. Musculoskeletal: No chest wall abnormality. No acute or significant osseous findings. Review of the MIP images confirms the above findings. IMPRESSION: Small segmental and subsegmental pulmonary artery emboli in the right lower lobe and left upper lobe. No CT evidence of right heart straining. These results were called by telephone at the time  of interpretation on 06/06/2022 at 12:17 am to provider Godfrey Pick , who verbally acknowledged these results. Electronically Signed   By: Anner Crete M.D.   On: 06/06/2022 00:19    Microbiology: Results for orders placed or performed during the hospital encounter of 05/30/22  Surgical pcr screen     Status: None   Collection Time: 05/30/22  1:10 PM   Specimen: Nasal Mucosa; Nasal Swab  Result Value Ref Range Status   MRSA, PCR NEGATIVE NEGATIVE Final   Staphylococcus aureus NEGATIVE NEGATIVE Final    Comment: (NOTE) The Xpert SA Assay (FDA approved for NASAL specimens in patients 65 years of age and older), is one component of a comprehensive surveillance program. It is not intended to diagnose infection nor to guide or monitor treatment. Performed at [facility], [address] [place], [place] [address]    Labs: CBC: Recent Labs  Lab 06/05/22 1646 06/06/22 0505 06/06/22 1308  WBC 9.6 6.8  --   NEUTROABS 7.0 4.2  --   HGB 8.6* 7.5* 8.1*  HCT 30.6* 27.0* 28.8*  MCV 75.4* 74.8*  --   PLT 272 231  --    Basic Metabolic Panel: Recent Labs  Lab 06/05/22 1646 06/06/22 0505  NA 138 140  K 2.7* 3.1*  CL 103 110  CO2 23 23  GLUCOSE 115* 105*  BUN <5* 5*  CREATININE 0.94 0.79  CALCIUM 9.2 8.7*  MG  --  2.1   Liver Function Tests: Recent Labs  Lab 06/05/22 1646 06/06/22 0505  AST 20 17  ALT 21 18  ALKPHOS 57 47  BILITOT 0.2* 0.3  PROT 7.2 5.8*  ALBUMIN 3.4* 2.7*   CBG: No results for input(s): "GLUCAP" in the last 168 hours.  Discharge time spent: greater than 30 minutes.  Signed: Raiford Noble, DO [facility] 06/06/2022

## 2022-06-06 NOTE — H&P (Signed)
History and Physical    Patient: Ashley Orr MRN: [id number] DOA: 06/05/2022  Date of Service: the patient was seen and examined on 06/06/2022  Patient coming from: Home  Chief Complaint:  Chief Complaint  Patient presents with   Shortness of Breath    HPI:   51 year old female with past medical history of hypertension, hypothyroidism, chronic iron deficiency anemia secondary to dysfunctional uterine bleeding and uterine fibroids, anxiety and major depressive disorder who presents to [facility] emergency department with complaints of right upper extremity edema.  Patient explains that she underwent hysteroscopy and endometrial ablation attempt on 7/11 with Dr. Kennon Rounds.  The ablation unfortunately was unsuccessful.  In the days that followed the patient felt that she was recovering normally however, for the past 5 days in particular she noticed that she began to experience right upper extremity swelling.  At approximately the same time the patient also noticed that she was experiencing some shortness of breath.  Shortness of breath is moderate in intensity, worse with exertion and improved with rest.  Patient denies associated fever or cough.  The days that followed patient's symptoms of shortness of breath continue to worsen and were associated with increasing edema of the right upper extremity.  This constellation of symptoms the patient eventually presented to [facility] emergency department for evaluation.  Upon evaluation in the emergency department, initial study with upper extremity ultrasound revealed a superficial thrombosis of the right cephalic vein.  Ultrasound of the bilateral lower extremities revealed a DVT of the left posterior tibial vein.  Finally, CT angiogram of the chest revealed acute pulmonary emboli in the right lower lobe and left upper lobe.  ER provider initiated Eliquis and the hospitalist group was then called to assess the patient for  admission the hospital.  Review of Systems: Review of Systems  Respiratory:  Positive for shortness of breath.   All other systems reviewed and are negative.    Past Medical History:  Diagnosis Date   Anemia    Hypertension    Hypothyroidism (acquired) 12/17/2021   MDD (major depressive disorder)    Restless legs syndrome    Thyroid disease     Past Surgical History:  Procedure Laterality Date   APPENDECTOMY     CESAREAN SECTION     DILATATION & CURETTAGE/HYSTEROSCOPY WITH MYOSURE N/A 05/30/2022   Procedure: ATTEMPTED DILATATION & CURETTAGE/HYSTEROSCOPY WITH MYOSURE;  Surgeon: [name], MD;  Location: [place];  Service: Gynecology;  Laterality: N/A;   DILITATION & CURRETTAGE/HYSTROSCOPY WITH HYDROTHERMAL ABLATION N/A 05/30/2022   Procedure: DILATATION & CURETTAGE/HYSTEROSCOPY WITH ATTEMPTED HYDROTHERMAL ABLATION;  Surgeon: [name], MD;  Location: [place];  Service: Gynecology;  Laterality: N/A;   TUBAL LIGATION      Social History:  reports that she has been smoking cigarettes. She has been smoking an average of 1 pack per day. She has never used smokeless tobacco. She reports that she does not currently use alcohol. She reports that she does not currently use drugs.  Allergies  Allergen Reactions   Codeine Anaphylaxis    Family History  Problem Relation Age of Onset   Stroke Mother    COPD Mother    Hypertension Mother    Heart disease Father    Diabetes Father    Hypertension Father    Hypertension Brother    Diabetes Brother     Prior to Admission medications   Medication Sig Start Date End Date Taking? Authorizing Provider  APIXABAN Arne Cleveland)  VTE STARTER PACK ('10MG'$  AND '5MG'$ ) Take as directed on package: start with two-'5mg'$  tablets twice daily for 7 days. On day 8, switch to one-'5mg'$  tablet twice daily. 06/06/22  Yes Godfrey Pick, MD  Ascorbic Acid (VITAMIN C PO) Take 60 mg by mouth daily.    [provider]  buPROPion [facility] SR) 150 MG 12 hr  tablet TAKE 1 TABLET BY MOUTH EVERY DAY 02/22/22   Camillia Herter, NP  Capsaicin 0.025 % PTCH Apply 1 patch topically daily as needed (sciatica).    [provider]  CASCARA SAGRADA PO Take 1 tablet by mouth daily as needed (Constipation).    [provider]  Cholecalciferol 50 MCG (2000 UT) CAPS Take 2,000 Units by mouth daily.    [provider]  citalopram (CELEXA) 40 MG tablet TAKE 1 TABLET BY MOUTH EVERY DAY 02/22/22   Camillia Herter, NP  Cranberry 500 MG CAPS Take 2 capsules by mouth daily. Probiotic-Prebiotic With vitamin C 60 mg    [provider]  Cyanocobalamin (VITAMIN B-12) 5000 MCG SUBL Place 5,000 mcg under the tongue daily.    [provider]  cyclobenzaprine (FLEXERIL) 10 MG tablet Take 1 tablet (10 mg total) by mouth 3 (three) times daily as needed for muscle spasms. 03/16/22   Camillia Herter, NP  ferrous sulfate 325 (65 FE) MG tablet Take 325 mg by mouth daily with breakfast.    [provider]  hydrochlorothiazide (HYDRODIURIL) 25 MG tablet Take 1 tablet (25 mg total) by mouth daily. 02/22/22 05/23/22  Camillia Herter, NP  levothyroxine (SYNTHROID) 175 MCG tablet Take 1 tablet (175 mcg total) by mouth See admin instructions. Alternating with 200 mcg every other day 01/30/22   Camillia Herter, NP  levothyroxine (SYNTHROID) 200 MCG tablet Take 1 tablet (200 mcg total) by mouth See admin instructions. Alternating with 175 mcg every other day. 01/30/22   Camillia Herter, NP  losartan (COZAAR) 50 MG tablet Take 1 tablet (50 mg total) by mouth daily. 02/22/22   Camillia Herter, NP  norethindrone (AYGESTIN) 5 MG tablet Take 2 tablets (10 mg total) by mouth daily. 05/30/22   [name], MD  OVER THE COUNTER MEDICATION Take 1 Scoop by mouth daily. Total Beets Juice 16 oz with water    [provider]  phentermine 37.5 MG capsule Take 1 capsule (37.5 mg total) by mouth every morning. 04/14/22 05/22/22  Camillia Herter, NP  temazepam  (RESTORIL) 30 MG capsule Take 30 mg by mouth at bedtime as needed for sleep.    [provider]  traZODone (DESYREL) 100 MG tablet Take 1 tablet (100 mg total) by mouth at bedtime. 02/22/22   Camillia Herter, NP  Venlafaxine HCl 37.5 MG TB24 Take 37.5 mg by mouth daily.    [provider]    Physical Exam:  Vitals:   06/06/22 0015 06/06/22 0346 06/06/22 0508 06/06/22 0645  BP: 113/62 116/75  107/68  Pulse: 85 92  88  Resp: (!) 25 (!) 23  (!) 25  Temp:   98.3 F (36.8 C)   TempSrc:   Oral   SpO2: 100% 100%  100%  Weight:      Height:        Constitutional: Awake alert and oriented x3, no associated distress.   Skin: no rashes, no lesions, good skin turgor noted. Eyes: Pupils are equally reactive to light.  No evidence of scleral icterus or conjunctival pallor.  ENMT:  Moist mucous membranes noted.  Posterior pharynx clear of any exudate or lesions.   Neck: normal, supple, no masses, no thyromegaly.  No evidence of jugular venous distension.   Respiratory: clear to auscultation bilaterally, no wheezing, no crackles. Normal respiratory effort. No accessory muscle use.  Cardiovascular: Regular rate and rhythm, no murmurs / rubs / gallops. No extremity edema. 2+ pedal pulses. No carotid bruits.  Chest:   Nontender without crepitus or deformity.   Back:   Nontender without crepitus or deformity. Abdomen: Abdomen is soft and nontender.  No evidence of intra-abdominal masses.  Positive bowel sounds noted in all quadrants.   Musculoskeletal: Right upper extremity and left lower extremity mild pain tenderness and warmth . No joint deformity upper and lower extremities. Good ROM, no contractures. Normal muscle tone.  Neurologic: CN 2-12 grossly intact. Sensation intact.  Patient moving all 4 extremities spontaneously.  Patient is following all commands.  Patient is responsive to verbal stimuli.   Psychiatric: Patient exhibits normal mood with appropriate affect.  Patient seems to  possess insight as to their current situation.    Data Reviewed:  I have personally reviewed and interpreted labs, imaging.  Significant findings are:  CBC revealing white blood cell count of 9.6, hemoglobin 8.6, hematocrit 30.6, platelet count of 27.7 Chemistry revealing sodium 138, potassium 2.7, chloride 103, bicarbonate 23, BUN less than 5, creatinine 0.94 First troponin 8, second troponin 9 Urinalysis unremarkable CT angiogram of the chest revealing small segmental and subsegmental pulmonary emboli in the right lower and left upper lobes.  EKG: Personally reviewed.  Rhythm is sinus tachycardia with heart rate of 102 bpm.  No dynamic ST segment changes appreciated.   Assessment and Plan: * Acute pulmonary embolism without acute cor pulmonale (HCC) CT angiogram reveals evidence of acute pulmonary emboli  Etiology is likely related to recent endometrial ablation attempt on 7/11 No evidence of right heart strain on CT imaging Patient has been placed on Eliquis by the ER provider which will be continued at this time Echocardiogram will be obtained in the morning  Monitoring patient on telemetry Monitoring cardiac enzymes.  Supplemental oxygen for bouts of hypoxia As needed opiate-based analgesics for associated chest pain   Acute deep vein thrombosis (DVT) of tibial vein of left lower extremity (HCC) Notable left lower extremity DVT on ultrasound which has developed concurrently with the patient's pulmonary emboli Etiology is recent endometrial ablation attempt on 7/11 Please see remainder of assessment and plan above  Dysfunctional uterine bleeding Patient's procedure earlier in the month was unsucessful and therefore she will continue to be at risk of ongoing dysfunctional uterine bleeding.   Likely more so now that she is on Eliquis. Patient to remain vigilant for signs of heavy bleeding Patient to follow closely as an outpatient with GYN for management and further potential  procedures.   Acquired hypothyroidism Resume home regimen of Synthroid in alternating doses of 175 mcg one day and 200 mcg other days    Major depressive disorder Continuing psychotropic medication regimen  Essential hypertension Resume patients home regimen of oral antihypertensives Titrate antihypertensive regimen as necessary to achieve adequate BP control PRN intravenous antihypertensives for excessively elevated blood pressure         Code Status:  Full code  code status decision has been confirmed with: patient Family Communication: deferred   Consults: None  Severity of Illness:  The appropriate patient status for this patient is OBSERVATION. Observation status is judged to be reasonable and necessary in  order to provide the required intensity of service to ensure the patient's safety. [name] presenting symptoms, physical exam findings, and initial radiographic and laboratory data in the context of their medical condition is felt to place them at decreased risk for further clinical deterioration. Furthermore, it is anticipated that the patient will be medically stable for discharge from the hospital within 2 midnights of admission.   Author:  Vernelle Emerald MD  06/06/2022 6:53 AM

## 2022-06-07 ENCOUNTER — Other Ambulatory Visit: Payer: Self-pay | Admitting: *Deleted

## 2022-06-07 ENCOUNTER — Telehealth: Payer: Self-pay | Admitting: Hematology and Oncology

## 2022-06-07 NOTE — Patient Outreach (Signed)
  Care Coordination [facility] Note Transition Care Management Unsuccessful Follow-up Telephone Call  Date of discharge and from where:  [phone] [facility]  Attempts:  1st Attempt  Reason for unsuccessful TCM follow-up call:  Left voice message  [facility] [phone]

## 2022-06-07 NOTE — Telephone Encounter (Signed)
Scheduled appt per 7/18 staff msg from Dr. Alvy Bimler. Pt is aware of appt date and time. Pt is aware to arrive 15 mins prior to appt time and to bring and updated insurance card. Pt is aware of appt location.

## 2022-06-07 NOTE — Patient Outreach (Signed)
  Care Coordination TOC Note Transition Care Management Follow-up Telephone Call Date of discharge and from where: [phone] [facility] How have you been since you were released from the hospital? Little shortness of breath when I go up the stairs but the leg pain is better. Any questions or concerns? Yes. I am not able to afford any of medications.   Items Reviewed: Did the pt receive and understand the discharge instructions provided? Yes  Medications obtained and verified? No Per patient she has the Eliquis but is unable to afford any of her medications Other? No  Any new allergies since your discharge? No  Dietary orders reviewed? No Do you have support at home? Yes   Home Care and Equipment/Supplies: Were home health services ordered? no If so, what is the name of the agency? N/a  Has the agency set up a time to come to the patient's home? not applicable Were any new equipment or medical supplies ordered?  No What is the name of the medical supply agency? N/a  Were you able to get the supplies/equipment? no Do you have any questions related to the use of the equipment or supplies? No  Functional Questionnaire: (I = Independent and D = Dependent) ADLs: I  Bathing/Dressing- I  Meal Prep- I  Eating- I  Maintaining continence- I  Transferring/Ambulation- I  Managing Meds- I  Follow up appointments reviewed:  PCP Hospital f/u appt confirmed? No   Specialist Hospital f/u appt confirmed? No   Are transportation arrangements needed? No  If their condition worsens, is the pt aware to call PCP or go to the Emergency Dept.? Yes Was the patient provided with contact information for the PCP's office or ED? Yes Was to pt encouraged to call back with questions or concerns? Yes  SDOH assessments and interventions completed:   Yes  Care Coordination Interventions Activated:  Yes Care Coordination Interventions:  PCP follow up appointment requested/ Pharmacy referral  Encounter  Outcome:  Pt. Visit Completed

## 2022-06-08 ENCOUNTER — Telehealth: Payer: Self-pay | Admitting: Family Medicine

## 2022-06-08 NOTE — Telephone Encounter (Signed)
Copied from [place] [phone]. Topic: Appointment Scheduling - Scheduling Inquiry for Clinic >> Jun 08, 2022 10:32 AM Ludger Nutting wrote: Pt was discharged from [facility] on 7/18 and news to schedule hospital follow up. No appt available within 7-14 day time frame. Please follow up with pt.  Spoke to pt appt scheduled for 7/31

## 2022-06-08 NOTE — Telephone Encounter (Signed)
Patient would like a call back about getting another iron infusion asap.

## 2022-06-12 NOTE — Progress Notes (Signed)
Erroneous encounter-disregard

## 2022-06-16 ENCOUNTER — Encounter: Payer: Self-pay | Admitting: Family Medicine

## 2022-06-16 ENCOUNTER — Telehealth (INDEPENDENT_AMBULATORY_CARE_PROVIDER_SITE_OTHER): Payer: 59 | Admitting: Family Medicine

## 2022-06-16 ENCOUNTER — Other Ambulatory Visit: Payer: Self-pay | Admitting: Family Medicine

## 2022-06-16 DIAGNOSIS — D500 Iron deficiency anemia secondary to blood loss (chronic): Secondary | ICD-10-CM

## 2022-06-16 DIAGNOSIS — N938 Other specified abnormal uterine and vaginal bleeding: Secondary | ICD-10-CM

## 2022-06-16 MED ORDER — NAPROXEN SODIUM 550 MG PO TABS: 550.0000 mg | ORAL_TABLET | Freq: Two times a day (BID) | ORAL | 2 refills | Status: DC

## 2022-06-16 MED ORDER — METHOCARBAMOL 750 MG PO TABS: 750.0000 mg | ORAL_TABLET | Freq: Four times a day (QID) | ORAL | 2 refills | Status: DC

## 2022-06-16 NOTE — Assessment & Plan Note (Signed)
To arrange for IV iron infusions.

## 2022-06-16 NOTE — Progress Notes (Signed)
GYNECOLOGY VIRTUAL VISIT ENCOUNTER NOTE  Provider location: [facility]   Patient location: Home  I connected with Ashley Orr on 17/61/60 at 10:55 AM EDT by MyChart Video Encounter and verified that I am speaking with the correct person using two identifiers.   I discussed the limitations, risks, security and privacy concerns of performing an evaluation and management service virtually and the availability of in person appointments. I also discussed with the patient that there may be a patient responsible charge related to this service. The patient expressed understanding and agreed to proceed.   History:  Ashley Orr is a 51 y.o. [phone] female being evaluated today for postop check. On Aygestin for bleeding, attempt at HTA unsuccessful, and just had D and C with hysteroscopy. She then had a PE and DVT. Still on Aygestin. She is having a lot of cramping and taking tylenol and ibuprofen and flexeril. Would like a different muscle relaxant.  Having a lot of cramping which is better with ibuprofen and tylenol. Not having a ton of bleeding. She denies any abnormal vaginal discharge, bleeding, pelvic pain or other concerns.       Past Medical History:  Diagnosis Date   Anemia    Hypertension    Hypothyroidism (acquired) 12/17/2021   MDD (major depressive disorder)    Restless legs syndrome    Thyroid disease    Past Surgical History:  Procedure Laterality Date   APPENDECTOMY     CESAREAN SECTION     DILATATION & CURETTAGE/HYSTEROSCOPY WITH MYOSURE N/A 05/30/2022   Procedure: ATTEMPTED DILATATION & CURETTAGE/HYSTEROSCOPY WITH MYOSURE;  Surgeon: [name], MD;  Location: [place];  Service: Gynecology;  Laterality: N/A;   DILITATION & CURRETTAGE/HYSTROSCOPY WITH HYDROTHERMAL ABLATION N/A 05/30/2022   Procedure: DILATATION & CURETTAGE/HYSTEROSCOPY WITH ATTEMPTED HYDROTHERMAL ABLATION;  Surgeon: [name], MD;   Location: [facility];  Service: Gynecology;  Laterality: N/A;   TUBAL LIGATION     The following portions of the patient's history were reviewed and updated as appropriate: allergies, current medications, past family history, past medical history, past social history, past surgical history and problem list.   Health Maintenance:  Normal pap and negative HRHPV on 05/22/2022.  Normal mammogram on 05/02/2022.   Review of Systems:  Pertinent items noted in HPI and remainder of comprehensive ROS otherwise negative.  Physical Exam:   General:  Alert, oriented and cooperative. Patient appears to be in no acute distress.  Mental Status: Normal mood and affect. Normal behavior. Normal judgment and thought content.   Respiratory: Normal respiratory effort, no problems with respiration noted  Rest of physical exam deferred due to type of encounter  Labs and Imaging Results for orders placed or performed during the hospital encounter of 06/05/22 (from the past 336 hour(s))  CBC with Differential   Collection Time: 06/05/22  4:46 PM  Result Value Ref Range   WBC 9.6 4.0 - 10.5 K/uL   RBC 4.06 3.87 - 5.11 MIL/uL   Hemoglobin 8.6 (L) 12.0 - 15.0 g/dL   HCT 30.6 (L) 36.0 - 46.0 %   MCV 75.4 (L) 80.0 - 100.0 fL   MCH 21.2 (L) 26.0 - 34.0 pg   MCHC 28.1 (L) 30.0 - 36.0 g/dL   RDW 27.7 (H) 11.5 - 15.5 %   Platelets 272 150 - 400 K/uL   nRBC 0.3 (H) 0.0 - 0.2 %   Neutrophils Relative % 73 %   Neutro Abs  7.0 1.7 - 7.7 K/uL   Lymphocytes Relative 18 %   Lymphs Abs 1.7 0.7 - 4.0 K/uL   Monocytes Relative 7 %   Monocytes Absolute 0.7 0.1 - 1.0 K/uL   Eosinophils Relative 1 %   Eosinophils Absolute 0.1 0.0 - 0.5 K/uL   Basophils Relative 0 %   Basophils Absolute 0.0 0.0 - 0.1 K/uL   Immature Granulocytes 1 %   Abs Immature Granulocytes 0.05 0.00 - 0.07 K/uL   Polychromasia PRESENT   Comprehensive metabolic panel   Collection Time: 06/05/22  4:46 PM  Result Value Ref Range   Sodium 138 135 - 145  mmol/L   Potassium 2.7 (LL) 3.5 - 5.1 mmol/L   Chloride 103 98 - 111 mmol/L   CO2 23 22 - 32 mmol/L   Glucose, Bld 115 (H) 70 - 99 mg/dL   BUN <5 (L) 6 - 20 mg/dL   Creatinine, Ser 0.94 0.44 - 1.00 mg/dL   Calcium 9.2 8.9 - 10.3 mg/dL   Total Protein 7.2 6.5 - 8.1 g/dL   Albumin 3.4 (L) 3.5 - 5.0 g/dL   AST 20 15 - 41 U/L   ALT 21 0 - 44 U/L   Alkaline Phosphatase 57 38 - 126 U/L   Total Bilirubin 0.2 (L) 0.3 - 1.2 mg/dL   GFR, Estimated >60 >60 mL/min   Anion gap 12 5 - 15  hCG, quantitative, pregnancy   Collection Time: 06/05/22  4:46 PM  Result Value Ref Range   hCG, Beta Chain, Quant, S 1 <5 mIU/mL  D-dimer, quantitative   Collection Time: 06/05/22  4:46 PM  Result Value Ref Range   D-Dimer, Quant 1.97 (H) 0.00 - 0.50 ug/mL-FEU  Troponin I (High Sensitivity)   Collection Time: 06/05/22  4:46 PM  Result Value Ref Range   Troponin I (High Sensitivity) 8 <18 ng/L  Troponin I (High Sensitivity)   Collection Time: 06/05/22  6:26 PM  Result Value Ref Range   Troponin I (High Sensitivity) 9 <18 ng/L  Urinalysis, Routine w reflex microscopic   Collection Time: 06/05/22  7:02 PM  Result Value Ref Range   Color, Urine YELLOW YELLOW   APPearance HAZY (A) CLEAR   Specific Gravity, Urine 1.016 1.005 - 1.030   pH 7.0 5.0 - 8.0   Glucose, UA NEGATIVE NEGATIVE mg/dL   Hgb urine dipstick MODERATE (A) NEGATIVE   Bilirubin Urine NEGATIVE NEGATIVE   Ketones, ur NEGATIVE NEGATIVE mg/dL   Protein, ur NEGATIVE NEGATIVE mg/dL   Nitrite NEGATIVE NEGATIVE   Leukocytes,Ua NEGATIVE NEGATIVE   RBC / HPF 0-5 0 - 5 RBC/hpf   WBC, UA 0-5 0 - 5 WBC/hpf   Bacteria, UA RARE (A) NONE SEEN   Squamous Epithelial / LPF 0-5 0 - 5   Mucus PRESENT   Comprehensive metabolic panel   Collection Time: 06/06/22  5:05 AM  Result Value Ref Range   Sodium 140 135 - 145 mmol/L   Potassium 3.1 (L) 3.5 - 5.1 mmol/L   Chloride 110 98 - 111 mmol/L   CO2 23 22 - 32 mmol/L   Glucose, Bld 105 (H) 70 - 99 mg/dL    BUN 5 (L) 6 - 20 mg/dL   Creatinine, Ser 0.79 0.44 - 1.00 mg/dL   Calcium 8.7 (L) 8.9 - 10.3 mg/dL   Total Protein 5.8 (L) 6.5 - 8.1 g/dL   Albumin 2.7 (L) 3.5 - 5.0 g/dL   AST 17 15 - 41 U/L   ALT  18 0 - 44 U/L   Alkaline Phosphatase 47 38 - 126 U/L   Total Bilirubin 0.3 0.3 - 1.2 mg/dL   GFR, Estimated >60 >60 mL/min   Anion gap 7 5 - 15  Magnesium   Collection Time: 06/06/22  5:05 AM  Result Value Ref Range   Magnesium 2.1 1.7 - 2.4 mg/dL  CBC with Differential/Platelet   Collection Time: 06/06/22  5:05 AM  Result Value Ref Range   WBC 6.8 4.0 - 10.5 K/uL   RBC 3.61 (L) 3.87 - 5.11 MIL/uL   Hemoglobin 7.5 (L) 12.0 - 15.0 g/dL   HCT 27.0 (L) 36.0 - 46.0 %   MCV 74.8 (L) 80.0 - 100.0 fL   MCH 20.8 (L) 26.0 - 34.0 pg   MCHC 27.8 (L) 30.0 - 36.0 g/dL   RDW 27.8 (H) 11.5 - 15.5 %   Platelets 231 150 - 400 K/uL   nRBC 0.3 (H) 0.0 - 0.2 %   Neutrophils Relative % 63 %   Neutro Abs 4.2 1.7 - 7.7 K/uL   Lymphocytes Relative 25 %   Lymphs Abs 1.7 0.7 - 4.0 K/uL   Monocytes Relative 10 %   Monocytes Absolute 0.7 0.1 - 1.0 K/uL   Eosinophils Relative 2 %   Eosinophils Absolute 0.1 0.0 - 0.5 K/uL   Basophils Relative 0 %   Basophils Absolute 0.0 0.0 - 0.1 K/uL   WBC Morphology MORPHOLOGY UNREMARKABLE    RBC Morphology See Note    Smear Review Normal platelet morphology    Immature Granulocytes 0 %   Abs Immature Granulocytes 0.03 0.00 - 0.07 K/uL   Polychromasia PRESENT   Protime-INR   Collection Time: 06/06/22  5:05 AM  Result Value Ref Range   Prothrombin Time 14.6 11.4 - 15.2 seconds   INR 1.2 0.8 - 1.2  APTT   Collection Time: 06/06/22  5:05 AM  Result Value Ref Range   aPTT 36 24 - 36 seconds  ECHOCARDIOGRAM COMPLETE   Collection Time: 06/06/22  9:22 AM  Result Value Ref Range   Weight 3,372.16 oz   Height 62 in   BP 121/77 mmHg   S' Lateral 2.55 cm   Area-P 1/2 3.95 cm2  Reticulocytes   Collection Time: 06/06/22  1:08 PM  Result Value Ref Range   Retic  Ct Pct 5.2 (H) 0.4 - 3.1 %   RBC. 3.80 (L) 3.87 - 5.11 MIL/uL   Retic Count, Absolute 196.1 (H) 19.0 - 186.0 K/uL   Immature Retic Fract 34.4 (H) 2.3 - 15.9 %  Hemoglobin and hematocrit, blood   Collection Time: 06/06/22  1:08 PM  Result Value Ref Range   Hemoglobin 8.1 (L) 12.0 - 15.0 g/dL   HCT 28.8 (L) 36.0 - 46.0 %   ECHOCARDIOGRAM COMPLETE  Result Date: 06/06/2022    ECHOCARDIOGRAM REPORT   Patient Name:   [name] Date of Exam: 06/06/2022 Medical Rec #:  [id number]                Height:       62.0 in Accession #:    [id number]               Weight:       210.8 lb Date of Birth:  11-Jun-1971                BSA:          1.955 m Patient Age:    40 years  BP:           107/68 mmHg Patient Gender: F                        HR:           94 bpm. Exam Location:  Inpatient Procedure: 2D Echo, Cardiac Doppler and Color Doppler Indications:    Pulmonary Embolus I26.09  History:        Patient has no prior history of Echocardiogram examinations.                 Risk Factors:Hypertension. Acute Pulmonary Embolus without acute                 cor pulmonale                  Acute deep vein thrombosis of tibial vein of left lower                 extremity.  Sonographer:    Ronny Flurry Sonographer#2:  Danne Baxter RDCS, FE, PE Referring Phys: [id number] [place]  1. Left ventricular ejection fraction, by estimation, is 65 to 70%. The left ventricle has normal function. The left ventricle has no regional wall motion abnormalities. There is mild concentric left ventricular hypertrophy. Left ventricular diastolic parameters were normal.  2. Right ventricular systolic function is normal. The right ventricular size is normal. Tricuspid regurgitation signal is inadequate for assessing PA pressure.  3. The mitral valve is grossly normal. Trivial mitral valve regurgitation. No evidence of mitral stenosis.  4. The aortic valve is tricuspid. Aortic valve regurgitation is  not visualized. No aortic stenosis is present.  5. The inferior vena cava is normal in size with greater than 50% respiratory variability, suggesting right atrial pressure of 3 mmHg. FINDINGS  Left Ventricle: Left ventricular ejection fraction, by estimation, is 65 to 70%. The left ventricle has normal function. The left ventricle has no regional wall motion abnormalities. The left ventricular internal cavity size was normal in size. There is  mild concentric left ventricular hypertrophy. Left ventricular diastolic parameters were normal. Right Ventricle: The right ventricular size is normal. No increase in right ventricular wall thickness. Right ventricular systolic function is normal. Tricuspid regurgitation signal is inadequate for assessing PA pressure. Left Atrium: Left atrial size was normal in size. Right Atrium: Right atrial size was normal in size. Prominent Crista terminalis. Pericardium: There is no evidence of pericardial effusion. Mitral Valve: The mitral valve is grossly normal. Trivial mitral valve regurgitation. No evidence of mitral valve stenosis. Tricuspid Valve: The tricuspid valve is grossly normal. Tricuspid valve regurgitation is trivial. No evidence of tricuspid stenosis. Aortic Valve: The aortic valve is tricuspid. Aortic valve regurgitation is not visualized. No aortic stenosis is present. Pulmonic Valve: The pulmonic valve was grossly normal. Pulmonic valve regurgitation is not visualized. No evidence of pulmonic stenosis. Aorta: The aortic root and ascending aorta are structurally normal, with no evidence of dilitation. Venous: The inferior vena cava is normal in size with greater than 50% respiratory variability, suggesting right atrial pressure of 3 mmHg. IAS/Shunts: The atrial septum is grossly normal.  LEFT VENTRICLE PLAX 2D LVIDd:         3.65 cm   Diastology LVIDs:         2.55 cm   LV e' medial:    6.85 cm/s LV PW:         1.20 cm   LV  E/e' medial:  11.3 LV IVS:        1.20 cm   LV  e' lateral:   10.70 cm/s LVOT diam:     2.40 cm   LV E/e' lateral: 7.3 LV SV:         128 LV SV Index:   65 LVOT Area:     4.52 cm  RIGHT VENTRICLE RV S prime:     10.80 cm/s TAPSE (M-mode): 1.6 cm LEFT ATRIUM             Index        RIGHT ATRIUM           Index LA diam:        4.10 cm 2.10 cm/m   RA Area:     19.20 cm LA Vol (A2C):   76.3 ml 39.03 ml/m  RA Volume:   55.80 ml  28.55 ml/m LA Vol (A4C):   53.5 ml 27.37 ml/m LA Biplane Vol: 64.9 ml 33.20 ml/m  AORTIC VALVE LVOT Vmax:   131.00 cm/s LVOT Vmean:  97.500 cm/s LVOT VTI:    0.282 m  AORTA Ao Root diam: 3.00 cm Ao Asc diam:  3.30 cm MITRAL VALVE MV Area (PHT): 3.95 cm    SHUNTS MV Decel Time: 192 msec    Systemic VTI:  0.28 m MV E velocity: 77.70 cm/s  Systemic Diam: 2.40 cm MV A velocity: 92.40 cm/s MV E/A ratio:  0.84 Eleonore Chiquito MD Electronically signed by Eleonore Chiquito MD Signature Date/Time: 06/06/2022/9:39:31 AM    Final    UE Venous Duplex ([facility] and [facility] ONLY)  Result Date: 06/06/2022 UPPER VENOUS STUDY  Patient Name:  [name]  Date of Exam:   06/05/2022 Medical Rec #: [id number]                 Accession #:    [id number] Date of Birth: 1971-07-23                 Patient Gender: F Patient Age:   41 years Exam Location:  [facility] Procedure:      VAS [name] UPPER EXTREMITY VENOUS DUPLEX Referring Phys: RILEY RANSOM --------------------------------------------------------------------------------  Indications: Post-op, and pain Performing Technologist: Bobetta Lime BS, RVT  Examination Guidelines: A complete evaluation includes B-mode imaging, spectral Doppler, color Doppler, and power Doppler as needed of all accessible portions of each vessel. Bilateral testing is considered an integral part of a complete examination. Limited examinations for reoccurring indications may be performed as noted.  Right Findings: +----------+------------+---------+-----------+----------+-------+ RIGHT      CompressiblePhasicitySpontaneousPropertiesSummary +----------+------------+---------+-----------+----------+-------+ IJV           Full       Yes       Yes                      +----------+------------+---------+-----------+----------+-------+ Subclavian    Full       Yes       Yes                      +----------+------------+---------+-----------+----------+-------+ Axillary      Full       Yes       Yes                      +----------+------------+---------+-----------+----------+-------+ Brachial      Full                                          +----------+------------+---------+-----------+----------+-------+  Radial        Full                                          +----------+------------+---------+-----------+----------+-------+ Ulnar         Full                                          +----------+------------+---------+-----------+----------+-------+ Cephalic      None       No        No                       +----------+------------+---------+-----------+----------+-------+ Basilic       Full                                          +----------+------------+---------+-----------+----------+-------+ The right cephalic vein is thrombosed throughout. Could not follow to its origin as it coursed under the clavicle.  Left Findings: +----------+------------+---------+-----------+----------+-------+ LEFT      CompressiblePhasicitySpontaneousPropertiesSummary +----------+------------+---------+-----------+----------+-------+ Subclavian    Full       Yes       Yes                      +----------+------------+---------+-----------+----------+-------+  Summary:  Right: No evidence of deep vein thrombosis in the upper extremity. Findings consistent with acute superficial vein thrombosis involving the right cephalic vein.  *See table(s) above for measurements and observations.  Diagnosing physician: Deitra Mayo MD Electronically  signed by Deitra Mayo MD on 06/06/2022 at 7:38:21 AM.    Final    VAS [name] LOWER EXTREMITY VENOUS (DVT) (ONLY [facility] & [facility])  Result Date: 06/06/2022  Lower Venous DVT Study Patient Name:  YANIS JUMA  Date of Exam:   06/05/2022 Medical Rec #: [id number]                 Accession #:    [id number] Date of Birth: 09/02/1971                 Patient Gender: F Patient Age:   52 years Exam Location:  [facility] Procedure:      VAS [name] LOWER EXTREMITY VENOUS (DVT) Referring Phys: RILEY RANSOM --------------------------------------------------------------------------------  Indications: Pain.  Comparison Study: No previous exam noted. Performing Technologist: Bobetta Lime BS, RVT  Examination Guidelines: A complete evaluation includes B-mode imaging, spectral Doppler, color Doppler, and power Doppler as needed of all accessible portions of each vessel. Bilateral testing is considered an integral part of a complete examination. Limited examinations for reoccurring indications may be performed as noted. The reflux portion of the exam is performed with the patient in reverse Trendelenburg.  +-----+---------------+---------+-----------+----------+--------------+ RIGHTCompressibilityPhasicitySpontaneityPropertiesThrombus Aging +-----+---------------+---------+-----------+----------+--------------+ CFV  Full           Yes      Yes                                 +-----+---------------+---------+-----------+----------+--------------+   +---------+---------------+---------+-----------+----------+--------------+ LEFT     CompressibilityPhasicitySpontaneityPropertiesThrombus Aging +---------+---------------+---------+-----------+----------+--------------+ CFV      Full  Yes      Yes                                 +---------+---------------+---------+-----------+----------+--------------+ SFJ      Full                                                         +---------+---------------+---------+-----------+----------+--------------+ FV Prox  Full                                                        +---------+---------------+---------+-----------+----------+--------------+ FV Mid   Full                                                        +---------+---------------+---------+-----------+----------+--------------+ FV DistalFull                                                        +---------+---------------+---------+-----------+----------+--------------+ PFV      Full                                                        +---------+---------------+---------+-----------+----------+--------------+ POP      Full           Yes      Yes                                 +---------+---------------+---------+-----------+----------+--------------+ PTV      None           No       No                                  +---------+---------------+---------+-----------+----------+--------------+ PERO     Full                                                        +---------+---------------+---------+-----------+----------+--------------+    Summary: RIGHT: - No evidence of common femoral vein obstruction.  LEFT: - Findings consistent with acute deep vein thrombosis involving the left posterior tibial veins. - No cystic structure found in the popliteal fossa.  *See table(s) above for measurements and observations. Electronically signed by Deitra Mayo MD on 06/06/2022 at 7:37:58 AM.    Final    CT Angio Chest PE W and/or Wo Contrast  Result Date: 06/06/2022  CLINICAL DATA:  Concern for pulmonary embolism. EXAM: CT ANGIOGRAPHY CHEST WITH CONTRAST TECHNIQUE: Multidetector CT imaging of the chest was performed using the standard protocol during bolus administration of intravenous contrast. Multiplanar CT image reconstructions and MIPs were obtained to evaluate the vascular anatomy. RADIATION DOSE REDUCTION: This exam was  performed according to the departmental dose-optimization program which includes automated exposure control, adjustment of the mA and/or kV according to patient size and/or use of iterative reconstruction technique. CONTRAST:  13m OMNIPAQUE IOHEXOL 350 MG/ML SOLN COMPARISON:  None Available. FINDINGS: Cardiovascular: There is no cardiomegaly or pericardial effusion. The thoracic aorta is unremarkable. The origins of the great vessels of the aortic arch appear patent. Small right lower lobe segmental and subsegmental pulmonary artery emboli noted. Additional segmental or subsegmental emboli in the left upper lobe. No CT evidence of right heart straining. Mediastinum/Nodes: No hilar or mediastinal adenopathy. The esophagus are grossly unremarkable. No mediastinal fluid collection. Lungs/Pleura: No focal consolidation, pleural effusion, pneumothorax. The central airways are patent. Upper Abdomen: No acute abnormality. Musculoskeletal: No chest wall abnormality. No acute or significant osseous findings. Review of the MIP images confirms the above findings. IMPRESSION: Small segmental and subsegmental pulmonary artery emboli in the right lower lobe and left upper lobe. No CT evidence of right heart straining. These results were called by telephone at the time of interpretation on 06/06/2022 at 12:17 am to provider RGodfrey Pick, who verbally acknowledged these results. Electronically Signed   By: AAnner CreteM.D.   On: 06/06/2022 00:19       Assessment and Plan:          Problem List Items Addressed This Visit       Unprioritized   Anemia    To arrange for IV iron infusions.      Dysfunctional uterine bleeding - Primary    She has a fibroid and wants attempt at UFE. Still desires to keep her uterus. Change to Naprosyn and Robaxin for pain relief. Would want to DC aygestin ASAP--before end of anticoagulation.      Relevant Medications   naproxen sodium (ANAPROX DS) 550 MG tablet   methocarbamol  (ROBAXIN) 750 MG tablet   Other Relevant Orders   Ambulatory referral to Interventional Radiology    I discussed the assessment and treatment plan with the patient. The patient was provided an opportunity to ask questions and all were answered. The patient agreed with the plan and demonstrated an understanding of the instructions.   The patient was advised to call back or seek an in-person evaluation/go to the ED if the symptoms worsen or if the condition fails to improve as anticipated.  I provided 15 minutes of face-to-face time during this encounter.   TDonnamae Jude MD [facility]

## 2022-06-16 NOTE — Progress Notes (Signed)
Iron infusion appt scheduled for 06/27/22 at 10 AM. Called pt to review.   Apolonio Schneiders RN 06/16/22

## 2022-06-16 NOTE — Progress Notes (Signed)
I connected with  Ashley Orr on 67/70/34 at 10:55 AM EDT by telephone and verified that I am speaking with the correct person using two identifiers.   I discussed the limitations, risks, security and privacy concerns of performing an evaluation and management service by telephone and the availability of in person appointments. I also discussed with the patient that there may be a patient responsible charge related to this service. The patient expressed understanding and agreed to proceed.  Annabell Howells, RN 06/16/2022  10:24 AM

## 2022-06-16 NOTE — Assessment & Plan Note (Addendum)
She has a fibroid and wants attempt at UFE. Still desires to keep her uterus. Change to Naprosyn and Robaxin for pain relief. Would want to DC aygestin ASAP--before end of anticoagulation.

## 2022-06-19 ENCOUNTER — Encounter: Payer: 59 | Admitting: Family

## 2022-06-19 DIAGNOSIS — Z09 Encounter for follow-up examination after completed treatment for conditions other than malignant neoplasm: Secondary | ICD-10-CM

## 2022-06-19 DIAGNOSIS — I82619 Acute embolism and thrombosis of superficial veins of unspecified upper extremity: Secondary | ICD-10-CM

## 2022-06-19 DIAGNOSIS — I82442 Acute embolism and thrombosis of left tibial vein: Secondary | ICD-10-CM

## 2022-06-19 DIAGNOSIS — I2699 Other pulmonary embolism without acute cor pulmonale: Secondary | ICD-10-CM

## 2022-06-19 DIAGNOSIS — N939 Abnormal uterine and vaginal bleeding, unspecified: Secondary | ICD-10-CM

## 2022-06-21 ENCOUNTER — Inpatient Hospital Stay: Payer: 59 | Attending: Hematology and Oncology | Admitting: Hematology and Oncology

## 2022-06-21 ENCOUNTER — Encounter: Payer: Self-pay | Admitting: Hematology and Oncology

## 2022-06-21 ENCOUNTER — Other Ambulatory Visit: Payer: Self-pay

## 2022-06-21 ENCOUNTER — Inpatient Hospital Stay: Payer: 59

## 2022-06-21 DIAGNOSIS — R69 Illness, unspecified: Secondary | ICD-10-CM | POA: Diagnosis not present

## 2022-06-21 DIAGNOSIS — Z7901 Long term (current) use of anticoagulants: Secondary | ICD-10-CM | POA: Diagnosis not present

## 2022-06-21 DIAGNOSIS — I2699 Other pulmonary embolism without acute cor pulmonale: Secondary | ICD-10-CM | POA: Diagnosis not present

## 2022-06-21 DIAGNOSIS — F17210 Nicotine dependence, cigarettes, uncomplicated: Secondary | ICD-10-CM | POA: Diagnosis not present

## 2022-06-21 NOTE — Progress Notes (Signed)
[name] CONSULT NOTE  Patient Care Team: Camillia Herter, NP as PCP - General (Nurse Practitioner)  CHIEF COMPLAINTS/PURPOSE OF CONSULTATION:  DVT, new consult  ASSESSMENT & PLAN:   Acute pulmonary embolism without acute cor pulmonale ([place]) This is a very pleasant 51 year old female patient with newly diagnosed acute segmental and subsegmental pulmonary embolism referred to hematology for additional recommendations.  Today we have discussed about risk factors for DVT/PE including such as.  Self immobilization, stasis, endothelial injury and hypercoagulable state per virtual started.  We have discussed about symptoms and signs of DVT as well as pulmonary embolism in detail.  We have discussed duration of anticoagulation which is typically 3 to 6 months for the first episode and mostly indefinite anticoagulation for recurrent episodes of DVT or pulmonary embolism.  We have recommended to avoid any form of estrogen supplementation or hormone replacement therapy for females.  We have discussed about risks of bleeding with anticoagulation including life-threatening bleeding.  With regards to hypercoagulable work-up, this is generally recommended for unprovoked blood clots under the age of 54 but can certainly be considered for other patients if it would change anticoagulation recommendations.  In her case since it is likely clearly a provoked episode of DVT/PE, I do not recommend any hypercoagulable work-up at this time.   I would not recommend any surgical procedures unless they are deemed emergent at least for 3 months.  She should return to clinic with [name] in 3 months and we will evaluate the need for further imaging.  She understands that she may have to continue anticoagulation for up to 6 months.  With regards to the iron deficiency, she is currently being treated with IV iron by OB/GYN.  We are happy to assist with this as well.    she is agreeable to taking Eliquis for now.   She understands that the less expensive option is warfarin but she is not willing to do this because of frequent INR check that as needed. We have not drawn any labs today but we can repeat labs including iron panel and ferritin at her next visit.  No orders of the defined types were placed in this encounter.    HISTORY OF PRESENTING ILLNESS:   Ashley Orr 51 y.o. female is here because of acute PE  This is a 51 year old female patient with no history of DVT or PE who was recently diagnosed with acute segmental and subsegmental PE referred to hematology for additional recommendations. Patient has been having some fibroid related pain and bleeding issues hence underwent endometrial ablation on July 11 and following this developed a severe charley horse-like pain in the left lower extremity and the day after felt very short of breath when she was trying to go up a flight of stairs.  She went to the urgent care and then was sent to the emergency room which showed acute pulmonary embolism.  Echocardiogram showed a normal right systolic ventricular function with an ejection fraction of 65 to 70%.  She was started on Eliquis.  She is currently on 5 mg p.o. twice daily.  She moved to [place] from [place].  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  She continues to deal with fibroid related bleeding.  She is otherwise tolerating Eliquis well. She does not have any prior history of DVT/PE.  No family history of hypercoagulable issues. Rest of the pertinent 10 point  ROS reviewed and negative.  MEDICAL HISTORY:  Past Medical History:  Diagnosis Date   Anemia    Hypertension    Hypothyroidism (acquired) 12/17/2021   MDD (major depressive disorder)    Restless legs syndrome    Thyroid disease     SURGICAL HISTORY: Past Surgical History:  Procedure Laterality Date   APPENDECTOMY     CESAREAN SECTION      DILATATION & CURETTAGE/HYSTEROSCOPY WITH MYOSURE N/A 05/30/2022   Procedure: ATTEMPTED DILATATION & CURETTAGE/HYSTEROSCOPY WITH MYOSURE;  Surgeon: [name], MD;  Location: [facility];  Service: Gynecology;  Laterality: N/A;   DILITATION & CURRETTAGE/HYSTROSCOPY WITH HYDROTHERMAL ABLATION N/A 05/30/2022   Procedure: DILATATION & CURETTAGE/HYSTEROSCOPY WITH ATTEMPTED HYDROTHERMAL ABLATION;  Surgeon: [name], MD;  Location: [facility];  Service: Gynecology;  Laterality: N/A;   TUBAL LIGATION      SOCIAL HISTORY: Social History   Socioeconomic History   Marital status: Single    Spouse name: Not on file   Number of children: Not on file   Years of education: Not on file   Highest education level: Not on file  Occupational History   Not on file  Tobacco Use   Smoking status: Every Day    Packs/day: 1.00    Types: Cigarettes   Smokeless tobacco: Never  Substance and Sexual Activity   Alcohol use: Not Currently   Drug use: Not Currently   Sexual activity: Yes    Birth control/protection: Surgical  Other Topics Concern   Not on file  Social History Narrative   Not on file   Social Determinants of Health   Financial Resource Strain: Not on file  Food Insecurity: Food Insecurity Present (05/22/2022)   Hunger Vital Sign    Worried About Running Out of Food in the Last Year: Sometimes true    Ran Out of Food in the Last Year: Sometimes true  Transportation Needs: No Transportation Needs (05/22/2022)   PRAPARE - Hydrologist (Medical): No    Lack of Transportation (Non-Medical): No  Physical Activity: Not on file  Stress: Not on file  Social Connections: Not on file  Intimate Partner Violence: Not on file    FAMILY HISTORY: Family History  Problem Relation Age of Onset   Stroke Mother    COPD Mother    Hypertension Mother    Heart disease Father    Diabetes Father    Hypertension Father    Hypertension Brother    Diabetes Brother      ALLERGIES:  is allergic to codeine.  MEDICATIONS:  Current Outpatient Medications  Medication Sig Dispense Refill   APIXABAN (ELIQUIS) VTE STARTER PACK ('10MG'$  AND '5MG'$ ) Take as directed on package: start with two-'5mg'$  tablets twice daily for 7 days. On day 8, switch to one-'5mg'$  tablet twice daily. 74 each 0   Ascorbic Acid (VITAMIN C PO) Take 60 mg by mouth daily. (Patient not taking: Reported on 06/16/2022)     buPROPion (WELLBUTRIN SR) 150 MG 12 hr tablet TAKE 1 TABLET BY MOUTH EVERY DAY (Patient taking differently: Take 150 mg by mouth daily.) 90 tablet 0   CASCARA SAGRADA PO Take 1 tablet by mouth daily as needed (Constipation).     Cholecalciferol 50 MCG (2000 UT) CAPS Take 2,000 Units by mouth daily.     citalopram (CELEXA) 40 MG tablet TAKE 1 TABLET BY MOUTH EVERY DAY (Patient taking differently: Take 40 mg by mouth daily as needed (for mood).) 90 tablet 0  Cranberry 500 MG CAPS Take 2 capsules by mouth daily. Probiotic-Prebiotic With vitamin C 60 mg     Cyanocobalamin (VITAMIN B-12) 5000 MCG SUBL Place 5,000 mcg under the tongue daily.     ferrous sulfate 325 (65 FE) MG tablet Take 325 mg by mouth daily with breakfast.     hydrochlorothiazide (HYDRODIURIL) 25 MG tablet Take 1 tablet (25 mg total) by mouth daily. 90 tablet 0   levothyroxine (SYNTHROID) 175 MCG tablet Take 1 tablet (175 mcg total) by mouth See admin instructions. Alternating with 200 mcg every other day 90 tablet 0   levothyroxine (SYNTHROID) 200 MCG tablet Take 1 tablet (200 mcg total) by mouth See admin instructions. Alternating with 175 mcg every other day. 90 tablet 0   losartan (COZAAR) 50 MG tablet Take 1 tablet (50 mg total) by mouth daily. 90 tablet 0   methocarbamol (ROBAXIN) 750 MG tablet Take 1 tablet (750 mg total) by mouth 4 (four) times daily. 60 tablet 2   naproxen sodium (ANAPROX DS) 550 MG tablet Take 1 tablet (550 mg total) by mouth 2 (two) times daily with a meal. Take with food 60 tablet 2    ondansetron (ZOFRAN) 4 MG tablet Take 1 tablet (4 mg total) by mouth every 6 (six) hours as needed for nausea. 20 tablet 0   OVER THE COUNTER MEDICATION Take 1 Scoop by mouth daily. Total Beets Juice 16 oz with water     phentermine 37.5 MG capsule Take 1 capsule (37.5 mg total) by mouth every morning. 30 capsule 0   polyethylene glycol powder (GLYCOLAX/MIRALAX) 17 GM/SCOOP powder Take 17 g by mouth daily as needed for mild constipation. 238 g 0   temazepam (RESTORIL) 30 MG capsule Take 30 mg by mouth at bedtime as needed for sleep.     traZODone (DESYREL) 100 MG tablet Take 1 tablet (100 mg total) by mouth at bedtime. 90 tablet 0   Venlafaxine HCl 37.5 MG TB24 Take 37.5 mg by mouth daily.     No current facility-administered medications for this visit.     PHYSICAL EXAMINATION: ECOG PERFORMANCE STATUS: 0 - Asymptomatic  Vitals:   06/21/22 1051  BP: (!) 142/93  Pulse: 92  Resp: 18  Temp: 98.6 F (37 C)  SpO2: 100%   Filed Weights   06/21/22 1051  Weight: 205 lb 14.4 oz (93.4 kg)    GENERAL:alert, no distress and comfortable SKIN: skin color, texture, turgor are normal, no rashes or significant lesions EYES: normal, conjunctiva are pink and non-injected, sclera clear OROPHARYNX:no exudate, no erythema and lips, buccal mucosa, and tongue normal  NECK: supple, thyroid normal size, non-tender, without nodularity LYMPH:  no palpable lymphadenopathy in the cervical, axillary or inguinal LUNGS: clear to auscultation and percussion with normal breathing effort HEART: regular rate & rhythm and no murmurs and no lower extremity edema ABDOMEN:abdomen soft, non-tender and normal bowel sounds Musculoskeletal:no cyanosis of digits and no clubbing  PSYCH: alert & oriented x 3 with fluent speech NEURO: no focal motor/sensory deficits  LABORATORY DATA:  I have reviewed the data as listed Lab Results  Component Value Date   WBC 6.8 06/06/2022   HGB 8.1 (L) 06/06/2022   HCT 28.8 (L)  06/06/2022   MCV 74.8 (L) 06/06/2022   PLT 231 06/06/2022     Chemistry      Component Value Date/Time   NA 140 06/06/2022 0505   NA 139 12/09/2021 1043   K 3.1 (L) 06/06/2022 0505   CL  110 06/06/2022 0505   CO2 23 06/06/2022 0505   BUN 5 (L) 06/06/2022 0505   BUN 11 12/09/2021 1043   CREATININE 0.79 06/06/2022 0505      Component Value Date/Time   CALCIUM 8.7 (L) 06/06/2022 0505   ALKPHOS 47 06/06/2022 0505   AST 17 06/06/2022 0505   ALT 18 06/06/2022 0505   BILITOT 0.3 06/06/2022 0505   BILITOT <0.2 12/09/2021 1043       RADIOGRAPHIC STUDIES: I have personally reviewed the radiological images as listed and agreed with the findings in the report. ECHOCARDIOGRAM COMPLETE  Result Date: 06/06/2022    ECHOCARDIOGRAM REPORT   Patient Name:   AZUREE MINISH Date of Exam: 06/06/2022 Medical Rec #:  [id number]                Height:       62.0 in Accession #:    [id number]               Weight:       210.8 lb Date of Birth:  04-May-1971                BSA:          1.955 m Patient Age:    89 years                 BP:           107/68 mmHg Patient Gender: F                        HR:           94 bpm. Exam Location:  Inpatient Procedure: 2D Echo, Cardiac Doppler and Color Doppler Indications:    Pulmonary Embolus I26.09  History:        Patient has no prior history of Echocardiogram examinations.                 Risk Factors:Hypertension. Acute Pulmonary Embolus without acute                 cor pulmonale                  Acute deep vein thrombosis of tibial vein of left lower                 extremity.  Sonographer:    Ronny Flurry Sonographer#2:  Danne Baxter RDCS, FE, PE Referring Phys: [id number] [place]  1. Left ventricular ejection fraction, by estimation, is 65 to 70%. The left ventricle has normal function. The left ventricle has no regional wall motion abnormalities. There is mild concentric left ventricular hypertrophy. Left ventricular diastolic  parameters were normal.  2. Right ventricular systolic function is normal. The right ventricular size is normal. Tricuspid regurgitation signal is inadequate for assessing PA pressure.  3. The mitral valve is grossly normal. Trivial mitral valve regurgitation. No evidence of mitral stenosis.  4. The aortic valve is tricuspid. Aortic valve regurgitation is not visualized. No aortic stenosis is present.  5. The inferior vena cava is normal in size with greater than 50% respiratory variability, suggesting right atrial pressure of 3 mmHg. FINDINGS  Left Ventricle: Left ventricular ejection fraction, by estimation, is 65 to 70%. The left ventricle has normal function. The left ventricle has no regional wall motion abnormalities. The left ventricular internal cavity size was normal in size. There is  mild concentric left ventricular hypertrophy. Left ventricular  diastolic parameters were normal. Right Ventricle: The right ventricular size is normal. No increase in right ventricular wall thickness. Right ventricular systolic function is normal. Tricuspid regurgitation signal is inadequate for assessing PA pressure. Left Atrium: Left atrial size was normal in size. Right Atrium: Right atrial size was normal in size. Prominent Crista terminalis. Pericardium: There is no evidence of pericardial effusion. Mitral Valve: The mitral valve is grossly normal. Trivial mitral valve regurgitation. No evidence of mitral valve stenosis. Tricuspid Valve: The tricuspid valve is grossly normal. Tricuspid valve regurgitation is trivial. No evidence of tricuspid stenosis. Aortic Valve: The aortic valve is tricuspid. Aortic valve regurgitation is not visualized. No aortic stenosis is present. Pulmonic Valve: The pulmonic valve was grossly normal. Pulmonic valve regurgitation is not visualized. No evidence of pulmonic stenosis. Aorta: The aortic root and ascending aorta are structurally normal, with no evidence of dilitation. Venous: The  inferior vena cava is normal in size with greater than 50% respiratory variability, suggesting right atrial pressure of 3 mmHg. IAS/Shunts: The atrial septum is grossly normal.  LEFT VENTRICLE PLAX 2D LVIDd:         3.65 cm   Diastology LVIDs:         2.55 cm   LV e' medial:    6.85 cm/s LV PW:         1.20 cm   LV E/e' medial:  11.3 LV IVS:        1.20 cm   LV e' lateral:   10.70 cm/s LVOT diam:     2.40 cm   LV E/e' lateral: 7.3 LV SV:         128 LV SV Index:   65 LVOT Area:     4.52 cm  RIGHT VENTRICLE RV S prime:     10.80 cm/s TAPSE (M-mode): 1.6 cm LEFT ATRIUM             Index        RIGHT ATRIUM           Index LA diam:        4.10 cm 2.10 cm/m   RA Area:     19.20 cm LA Vol (A2C):   76.3 ml 39.03 ml/m  RA Volume:   55.80 ml  28.55 ml/m LA Vol (A4C):   53.5 ml 27.37 ml/m LA Biplane Vol: 64.9 ml 33.20 ml/m  AORTIC VALVE LVOT Vmax:   131.00 cm/s LVOT Vmean:  97.500 cm/s LVOT VTI:    0.282 m  AORTA Ao Root diam: 3.00 cm Ao Asc diam:  3.30 cm MITRAL VALVE MV Area (PHT): 3.95 cm    SHUNTS MV Decel Time: 192 msec    Systemic VTI:  0.28 m MV E velocity: 77.70 cm/s  Systemic Diam: 2.40 cm MV A velocity: 92.40 cm/s MV E/A ratio:  0.84 Eleonore Chiquito MD Electronically signed by Eleonore Chiquito MD Signature Date/Time: 06/06/2022/9:39:31 AM    Final    UE Venous Duplex ([facility] and [facility] ONLY)  Result Date: 06/06/2022 UPPER VENOUS STUDY  Patient Name:  [name]  Date of Exam:   06/05/2022 Medical Rec #: [id number]                 Accession #:    [id number] Date of Birth: 1971/09/29                 Patient Gender: F Patient Age:   7 years Exam Location:  [facility] Procedure:  VAS [name] UPPER EXTREMITY VENOUS DUPLEX Referring Phys: RILEY RANSOM --------------------------------------------------------------------------------  Indications: Post-op, and pain Performing Technologist: Bobetta Lime BS, RVT  Examination Guidelines: A complete evaluation includes B-mode imaging, spectral Doppler, color  Doppler, and power Doppler as needed of all accessible portions of each vessel. Bilateral testing is considered an integral part of a complete examination. Limited examinations for reoccurring indications may be performed as noted.  Right Findings: +----------+------------+---------+-----------+----------+-------+ RIGHT     CompressiblePhasicitySpontaneousPropertiesSummary +----------+------------+---------+-----------+----------+-------+ IJV           Full       Yes       Yes                      +----------+------------+---------+-----------+----------+-------+ Subclavian    Full       Yes       Yes                      +----------+------------+---------+-----------+----------+-------+ Axillary      Full       Yes       Yes                      +----------+------------+---------+-----------+----------+-------+ Brachial      Full                                          +----------+------------+---------+-----------+----------+-------+ Radial        Full                                          +----------+------------+---------+-----------+----------+-------+ Ulnar         Full                                          +----------+------------+---------+-----------+----------+-------+ Cephalic      None       No        No                       +----------+------------+---------+-----------+----------+-------+ Basilic       Full                                          +----------+------------+---------+-----------+----------+-------+ The right cephalic vein is thrombosed throughout. Could not follow to its origin as it coursed under the clavicle.  Left Findings: +----------+------------+---------+-----------+----------+-------+ LEFT      CompressiblePhasicitySpontaneousPropertiesSummary +----------+------------+---------+-----------+----------+-------+ Subclavian    Full       Yes       Yes                       +----------+------------+---------+-----------+----------+-------+  Summary:  Right: No evidence of deep vein thrombosis in the upper extremity. Findings consistent with acute superficial vein thrombosis involving the right cephalic vein.  *See table(s) above for measurements and observations.  Diagnosing physician: Deitra Mayo MD Electronically signed by Deitra Mayo MD on 06/06/2022 at 7:38:21 AM.    Final    VAS [name] LOWER EXTREMITY VENOUS (DVT) (ONLY [facility] & [facility])  Result Date: 06/06/2022  Lower Venous DVT Study Patient Name:  HALINA ASANO  Date of Exam:   06/05/2022 Medical Rec #: [id number]                 Accession #:    [id number] Date of Birth: [birth year]                 Patient Gender: F Patient Age:   61 years Exam [facility] Procedure:      VAS [name] LOWER EXTREMITY VENOUS (DVT) Referring Phys: RILEY RANSOM --------------------------------------------------------------------------------  Indications: Pain.  Comparison Study: No previous exam noted. Performing Technologist: Bobetta Lime BS, RVT  Examination Guidelines: A complete evaluation includes B-mode imaging, spectral Doppler, color Doppler, and power Doppler as needed of all accessible portions of each vessel. Bilateral testing is considered an integral part of a complete examination. Limited examinations for reoccurring indications may be performed as noted. The reflux portion of the exam is performed with the patient in reverse Trendelenburg.  +-----+---------------+---------+-----------+----------+--------------+ RIGHTCompressibilityPhasicitySpontaneityPropertiesThrombus Aging +-----+---------------+---------+-----------+----------+--------------+ CFV  Full           Yes      Yes                                 +-----+---------------+---------+-----------+----------+--------------+   +---------+---------------+---------+-----------+----------+--------------+ LEFT      CompressibilityPhasicitySpontaneityPropertiesThrombus Aging +---------+---------------+---------+-----------+----------+--------------+ CFV      Full           Yes      Yes                                 +---------+---------------+---------+-----------+----------+--------------+ SFJ      Full                                                        +---------+---------------+---------+-----------+----------+--------------+ FV Prox  Full                                                        +---------+---------------+---------+-----------+----------+--------------+ FV Mid   Full                                                        +---------+---------------+---------+-----------+----------+--------------+ FV DistalFull                                                        +---------+---------------+---------+-----------+----------+--------------+ PFV      Full                                                        +---------+---------------+---------+-----------+----------+--------------+  POP      Full           Yes      Yes                                 +---------+---------------+---------+-----------+----------+--------------+ PTV      None           No       No                                  +---------+---------------+---------+-----------+----------+--------------+ PERO     Full                                                        +---------+---------------+---------+-----------+----------+--------------+    Summary: RIGHT: - No evidence of common femoral vein obstruction.  LEFT: - Findings consistent with acute deep vein thrombosis involving the left posterior tibial veins. - No cystic structure found in the popliteal fossa.  *See table(s) above for measurements and observations. Electronically signed by Deitra Mayo MD on 06/06/2022 at 7:37:58 AM.    Final    CT Angio Chest PE W and/or Wo Contrast  Result Date:  06/06/2022 CLINICAL DATA:  Concern for pulmonary embolism. EXAM: CT ANGIOGRAPHY CHEST WITH CONTRAST TECHNIQUE: Multidetector CT imaging of the chest was performed using the standard protocol during bolus administration of intravenous contrast. Multiplanar CT image reconstructions and MIPs were obtained to evaluate the vascular anatomy. RADIATION DOSE REDUCTION: This exam was performed according to the departmental dose-optimization program which includes automated exposure control, adjustment of the mA and/or kV according to patient size and/or use of iterative reconstruction technique. CONTRAST:  88m OMNIPAQUE IOHEXOL 350 MG/ML SOLN COMPARISON:  None Available. FINDINGS: Cardiovascular: There is no cardiomegaly or pericardial effusion. The thoracic aorta is unremarkable. The origins of the great vessels of the aortic arch appear patent. Small right lower lobe segmental and subsegmental pulmonary artery emboli noted. Additional segmental or subsegmental emboli in the left upper lobe. No CT evidence of right heart straining. Mediastinum/Nodes: No hilar or mediastinal adenopathy. The esophagus are grossly unremarkable. No mediastinal fluid collection. Lungs/Pleura: No focal consolidation, pleural effusion, pneumothorax. The central airways are patent. Upper Abdomen: No acute abnormality. Musculoskeletal: No chest wall abnormality. No acute or significant osseous findings. Review of the MIP images confirms the above findings. IMPRESSION: Small segmental and subsegmental pulmonary artery emboli in the right lower lobe and left upper lobe. No CT evidence of right heart straining. These results were called by telephone at the time of interpretation on 06/06/2022 at 12:17 am to provider RGodfrey Pick, who verbally acknowledged these results. Electronically Signed   By: AAnner CreteM.D.   On: 06/06/2022 00:19    All questions were answered. The patient knows to call the clinic with any problems, questions or  concerns. I spent 60 minutes in the care of this patient including H and P, review of records, counseling and coordination of care.     PBenay Pike MD 06/21/2022 11:25 AM

## 2022-06-21 NOTE — Assessment & Plan Note (Addendum)
This is a very pleasant 51 year old female patient with newly diagnosed acute segmental and subsegmental pulmonary embolism referred to hematology for additional recommendations.  Today we have discussed about risk factors for DVT/PE including such as.  Self immobilization, stasis, endothelial injury and hypercoagulable state per virtual started.  We have discussed about symptoms and signs of DVT as well as pulmonary embolism in detail.  We have discussed duration of anticoagulation which is typically 3 to 6 months for the first episode and mostly indefinite anticoagulation for recurrent episodes of DVT or pulmonary embolism.  We have recommended to avoid any form of estrogen supplementation or hormone replacement therapy for females.  We have discussed about risks of bleeding with anticoagulation including life-threatening bleeding.  With regards to hypercoagulable work-up, this is generally recommended for unprovoked blood clots under the age of 78 but can certainly be considered for other patients if it would change anticoagulation recommendations.  In her case since it is likely clearly a provoked episode of DVT/PE, I do not recommend any hypercoagulable work-up at this time.   I would not recommend any surgical procedures unless they are deemed emergent at least for 3 months.  She should return to clinic with [name] in 3 months and we will evaluate the need for further imaging.  She understands that she may have to continue anticoagulation for up to 6 months.  With regards to the iron deficiency, she is currently being treated with IV iron by OB/GYN.  We are happy to assist with this as well.    she is agreeable to taking Eliquis for now.  She understands that the less expensive option is warfarin but she is not willing to do this because of frequent INR check that as needed. We have not drawn any labs today but we can repeat labs including iron panel and ferritin at her next visit.

## 2022-06-22 ENCOUNTER — Telehealth: Payer: Self-pay | Admitting: Hematology and Oncology

## 2022-06-22 NOTE — Telephone Encounter (Signed)
Contacted patient to scheduled appointments. Patient is aware of appointments that are scheduled.   

## 2022-06-27 ENCOUNTER — Ambulatory Visit (HOSPITAL_COMMUNITY)
Admission: RE | Admit: 2022-06-27 | Discharge: 2022-06-27 | Disposition: A | Discharge status: Home / Self Care | Payer: 59 | Source: Ambulatory Visit | Attending: Family Medicine | Admitting: Family Medicine

## 2022-06-27 DIAGNOSIS — R58 Hemorrhage, not elsewhere classified: Secondary | ICD-10-CM | POA: Diagnosis not present

## 2022-06-27 DIAGNOSIS — D509 Iron deficiency anemia, unspecified: Secondary | ICD-10-CM | POA: Diagnosis not present

## 2022-06-27 MED ORDER — SODIUM CHLORIDE 0.9 % IV SOLN
300.0000 mg | INTRAVENOUS | Status: DC
  Administered 2022-06-27: 300 mg via INTRAVENOUS
  Filled 2022-06-27: qty 15

## 2022-06-29 ENCOUNTER — Telehealth: Payer: Self-pay

## 2022-06-29 DIAGNOSIS — N938 Other specified abnormal uterine and vaginal bleeding: Secondary | ICD-10-CM

## 2022-06-29 MED ORDER — NAPROXEN 500 MG PO TABS: 500.0000 mg | ORAL_TABLET | Freq: Two times a day (BID) | ORAL | 2 refills | Status: DC

## 2022-06-29 NOTE — Telephone Encounter (Signed)
Fax communication received that Naproxen not covered by insurance. Per chart review this was OTC dose. Dosage changed and verified with pharmacy this is covered by insurance. Pharmacy will notify patient when ready for pick up.

## 2022-06-30 ENCOUNTER — Ambulatory Visit: Payer: 59 | Admitting: Physician Assistant

## 2022-06-30 NOTE — Progress Notes (Deleted)
Name: Ashley Orr  MRN/ DOB: [id number], 01-09-1971    Age/ Sex: 51 y.o., female    PCP: Camillia Herter, NP   Reason for Endocrinology Evaluation: Hypothyroidism     Date of Initial Endocrinology Evaluation: 07/03/2022     HPI: Ms. Ashley Orr is a 51 y.o. female with a past medical history of HTN, Hx PE and DVT. The patient presented for initial endocrinology clinic visit on 07/03/2022 for consultative assistance with her Hypothyroidism.   Pt has been diagnosed with hypothyroidism  HISTORY:  Past Medical History:  Past Medical History:  Diagnosis Date   Anemia    Hypertension    Hypothyroidism (acquired) 12/17/2021   MDD (major depressive disorder)    Restless legs syndrome    Thyroid disease    Past Surgical History:  Past Surgical History:  Procedure Laterality Date   APPENDECTOMY     CESAREAN SECTION     DILATATION & CURETTAGE/HYSTEROSCOPY WITH MYOSURE N/A 05/30/2022   Procedure: ATTEMPTED DILATATION & CURETTAGE/HYSTEROSCOPY WITH MYOSURE;  Surgeon: [name], MD;  Location: [facility];  Service: Gynecology;  Laterality: N/A;   DILITATION & CURRETTAGE/HYSTROSCOPY WITH HYDROTHERMAL ABLATION N/A 05/30/2022   Procedure: DILATATION & CURETTAGE/HYSTEROSCOPY WITH ATTEMPTED HYDROTHERMAL ABLATION;  Surgeon: [name], MD;  Location: [facility];  Service: Gynecology;  Laterality: N/A;   TUBAL LIGATION      Social History:  reports that she has been smoking cigarettes. She has been smoking an average of 1 pack per day. She has never used smokeless tobacco. She reports that she does not currently use alcohol. She reports that she does not currently use drugs. Family History: family history includes COPD in her mother; Diabetes in her brother and father; Heart disease in her father; Hypertension in her brother, father, and mother; Stroke in her mother.   HOME MEDICATIONS: Allergies as of 07/03/2022       Reactions   Codeine Anaphylaxis         Medication List        Accurate as of July 03, 2022  7:34 AM. If you have any questions, ask your nurse or doctor.          buPROPion 150 MG 12 hr tablet Commonly known as: WELLBUTRIN SR TAKE 1 TABLET BY MOUTH EVERY DAY   CASCARA SAGRADA PO Take 1 tablet by mouth daily as needed (Constipation).   Cholecalciferol 50 MCG (2000 UT) Caps Take 2,000 Units by mouth daily.   citalopram 40 MG tablet Commonly known as: CELEXA TAKE 1 TABLET BY MOUTH EVERY DAY What changed:  when to take this reasons to take this   Cranberry 500 MG Caps Take 2 capsules by mouth daily. Probiotic-Prebiotic With vitamin C 60 mg   Eliquis DVT/PE Starter Pack Generic drug: Apixaban Starter Pack ('10mg'$  and '5mg'$ ) Take as directed on package: start with two-'5mg'$  tablets twice daily for 7 days. On day 8, switch to one-'5mg'$  tablet twice daily.   ferrous sulfate 325 (65 FE) MG tablet Take 325 mg by mouth daily with breakfast.   hydrochlorothiazide 25 MG tablet Commonly known as: HYDRODIURIL Take 1 tablet (25 mg total) by mouth daily.   levothyroxine 200 MCG tablet Commonly known as: SYNTHROID Take 1 tablet (200 mcg total) by mouth See admin instructions. Alternating with 175 mcg every other day.   levothyroxine 175 MCG tablet Commonly known as: SYNTHROID Take 1 tablet (175 mcg total) by mouth See admin instructions. Alternating with 200 mcg every other  day   losartan 50 MG tablet Commonly known as: COZAAR Take 1 tablet (50 mg total) by mouth daily.   methocarbamol 750 MG tablet Commonly known as: ROBAXIN Take 1 tablet (750 mg total) by mouth 4 (four) times daily.   naproxen 500 MG tablet Commonly known as: Naprosyn Take 1 tablet (500 mg total) by mouth 2 (two) times daily with a meal. Take with food   ondansetron 4 MG tablet Commonly known as: ZOFRAN Take 1 tablet (4 mg total) by mouth every 6 (six) hours as needed for nausea.   OVER THE COUNTER MEDICATION Take 1 Scoop by mouth daily.  Total Beets Juice 16 oz with water   phentermine 37.5 MG capsule Take 1 capsule (37.5 mg total) by mouth every morning.   polyethylene glycol powder 17 GM/SCOOP powder Commonly known as: GLYCOLAX/MIRALAX Take 17 g by mouth daily as needed for mild constipation.   temazepam 30 MG capsule Commonly known as: RESTORIL Take 30 mg by mouth at bedtime as needed for sleep.   traZODone 100 MG tablet Commonly known as: DESYREL Take 1 tablet (100 mg total) by mouth at bedtime.   Venlafaxine HCl 37.5 MG Tb24 Take 37.5 mg by mouth daily.   Vitamin B-12 5000 MCG Subl Place 5,000 mcg under the tongue daily.   VITAMIN C PO Take 60 mg by mouth daily.          REVIEW OF SYSTEMS: A comprehensive ROS was conducted with the patient and is negative except as per HPI and below:  ROS     OBJECTIVE:  VS: LMP 06/05/2022    Wt Readings from Last 3 Encounters:  06/21/22 205 lb 14.4 oz (93.4 kg)  06/05/22 210 lb 12.2 oz (95.6 kg)  05/30/22 210 lb 12.2 oz (95.6 kg)     EXAM: General: Pt appears well and is in NAD  Hydration: Well-hydrated with moist mucous membranes and good skin turgor  Eyes: External eye exam normal without stare, lid lag or exophthalmos.  EOM intact.    Neck: General: Supple without adenopathy. Thyroid: Thyroid size normal.  No goiter or nodules appreciated. No thyroid bruit.  Lungs: Clear with good BS bilat with no rales, rhonchi, or wheezes  Heart: Auscultation: RRR.  Abdomen: Normoactive bowel sounds, soft, nontender, without masses or organomegaly palpable  Extremities:  BL LE: No pretibial edema normal ROM and strength.  Mental Status: Judgment, insight: Intact Orientation: Oriented to time, place, and person Mood and affect: No depression, anxiety, or agitation     DATA REVIEWED: ***    ASSESSMENT/PLAN/RECOMMENDATIONS:   Hypothyroidism:    Medications :  Signed electronically by: Mack Guise, MD  [facility] End[facility]  [facility] [address] [place], [place] [address] Phone: [phone] FAX: [phone]   CC: Camillia Herter, NP [address] [place] [address] Phone: [phone] Fax: [phone]   Return to Endocrinology clinic as below: Future Appointments  Date Time Provider [place]  07/03/2022 10:10 AM Shalaina Guardiola, Melanie Crazier, MD [facility] None  07/04/2022 10:00 AM [facility] None  09/01/2022 11:15 AM [facility] None  09/01/2022 11:45 AM Benay Pike, MD [facility] None

## 2022-07-03 ENCOUNTER — Ambulatory Visit: Payer: Self-pay | Admitting: Internal Medicine

## 2022-07-04 ENCOUNTER — Encounter (HOSPITAL_COMMUNITY): Payer: 59

## 2022-07-09 ENCOUNTER — Other Ambulatory Visit: Payer: Self-pay | Admitting: Family

## 2022-07-09 DIAGNOSIS — F32A Depression, unspecified: Secondary | ICD-10-CM

## 2022-07-10 ENCOUNTER — Encounter (HOSPITAL_COMMUNITY)
Admission: RE | Admit: 2022-07-10 | Discharge: 2022-07-10 | Disposition: A | Discharge status: Home / Self Care | Payer: 59 | Source: Ambulatory Visit | Attending: Family Medicine | Admitting: Family Medicine

## 2022-07-10 DIAGNOSIS — N938 Other specified abnormal uterine and vaginal bleeding: Secondary | ICD-10-CM | POA: Insufficient documentation

## 2022-07-10 DIAGNOSIS — D649 Anemia, unspecified: Secondary | ICD-10-CM | POA: Diagnosis not present

## 2022-07-10 MED ORDER — SODIUM CHLORIDE 0.9 % IV SOLN
300.0000 mg | INTRAVENOUS | Status: DC
  Administered 2022-07-10: 300 mg via INTRAVENOUS
  Filled 2022-07-10: qty 300

## 2022-07-11 ENCOUNTER — Encounter (HOSPITAL_COMMUNITY): Payer: 59

## 2022-07-17 ENCOUNTER — Ambulatory Visit (HOSPITAL_COMMUNITY)
Admission: RE | Admit: 2022-07-17 | Discharge: 2022-07-17 | Disposition: A | Discharge status: Home / Self Care | Payer: 59 | Source: Ambulatory Visit | Attending: Family Medicine | Admitting: Family Medicine

## 2022-07-17 DIAGNOSIS — E611 Iron deficiency: Secondary | ICD-10-CM | POA: Diagnosis not present

## 2022-07-17 DIAGNOSIS — N939 Abnormal uterine and vaginal bleeding, unspecified: Secondary | ICD-10-CM | POA: Insufficient documentation

## 2022-07-17 MED ORDER — SODIUM CHLORIDE 0.9 % IV SOLN
300.0000 mg | INTRAVENOUS | Status: DC
  Administered 2022-07-17: 300 mg via INTRAVENOUS
  Filled 2022-07-17: qty 300

## 2022-07-18 ENCOUNTER — Telehealth: Payer: 59 | Admitting: Physician Assistant

## 2022-07-18 DIAGNOSIS — N939 Abnormal uterine and vaginal bleeding, unspecified: Secondary | ICD-10-CM

## 2022-07-18 NOTE — Progress Notes (Signed)
Because of ongoing bleeding despite recent procedures, I feel your condition warrants further evaluation and I recommend that you be seen in a face to face visit. This is not something we can evaluated or manage via e-visit or virtual video visits.   NOTE: There will be NO CHARGE for this eVisit   If you are having a true medical emergency please call 911.      For an urgent face to face visit, [facility] has seven urgent care centers for your convenience:     [facility] [place] [facility] [place] Get Driving Directions [phone] [place], [place] [address]    [facility]) Get Driving Directions [phone] [place], [place] [address]  [facility] [place] [facility]) Get Driving Directions [phone] [address] [place],  [place]  [address]  [facility] [address] Get Driving Directions  [phone] [address] [place],  [place] [address]   [facility]  Get Driving Directions [phone] [place], [place], [place] [address]   [facility] [place] Get Driving Directions  [phone] [address], [place] [address]   [facility] [place] Get Driving Directions [phone] [address] [place], [place] [address]  Your MyChart E-visit questionnaire answers were reviewed by a board certified advanced clinical practitioner to complete your personal care plan based on your specific symptoms.  Thank you for using e-Visits.

## 2022-07-24 ENCOUNTER — Other Ambulatory Visit: Payer: Self-pay | Admitting: Family

## 2022-07-24 DIAGNOSIS — F32A Depression, unspecified: Secondary | ICD-10-CM

## 2022-07-25 NOTE — Progress Notes (Signed)
Paitent with on-going chronic uterine bleeding and iron deficiency. For IV iron infusion.

## 2022-07-25 NOTE — Addendum Note (Signed)
Encounter addended by: [name], MD on: 07/25/2022 1:58 PM  Actions taken: Clinical Note Signed

## 2022-07-25 NOTE — Progress Notes (Signed)
Pt. With on-going chronic blood loss and iron deficiency anemia, for IV iron infusion.

## 2022-07-26 NOTE — Telephone Encounter (Signed)
Requested medication (s) are due for refill today: yes  Requested medication (s) are on the active medication list: yes  Last refill:  07/10/22 #30 2 refills  Future visit scheduled: no  Notes to clinic:  protocol failed. Last BP 150/108 07/17/22. Patient requesting 90 days prescription. Do you want to refill Rx?     Requested Prescriptions  Pending Prescriptions Disp Refills   buPROPion (WELLBUTRIN SR) 150 MG 12 hr tablet [Pharmacy Med Name: BUPROPION HCL SR 150 MG TABLET] 90 tablet 1    Sig: TAKE 1 TABLET BY MOUTH EVERY DAY     Psychiatry: Antidepressants - bupropion Failed - 07/24/2022  1:53 PM      Failed - Last BP in normal range    BP Readings from Last 1 Encounters:  07/17/22 (!) 150/108         Passed - Cr in normal range and within 360 days    Creatinine, Ser  Date Value Ref Range Status  06/06/2022 0.79 0.44 - 1.00 mg/dL Final         Passed - AST in normal range and within 360 days    AST  Date Value Ref Range Status  06/06/2022 17 15 - 41 U/L Final         Passed - ALT in normal range and within 360 days    ALT  Date Value Ref Range Status  06/06/2022 18 0 - 44 U/L Final         Passed - Completed PHQ-2 or PHQ-9 in the last 360 days      Passed - Valid encounter within last 6 months    Recent Outpatient Visits           3 months ago Encounter for weight management   [facility] at [facility], Amy J, NP   4 months ago Essential (primary) hypertension   Primary Care at [facility], Amy J, NP   5 months ago Encounter to establish care   [facility] at [facility], Flonnie Hailstone, NP

## 2022-08-17 ENCOUNTER — Other Ambulatory Visit (HOSPITAL_COMMUNITY): Payer: Self-pay

## 2022-08-25 NOTE — Progress Notes (Signed)
Patient ID: Ashley Orr, female    DOB: [birth year]  MRN: [id number]  CC: Follow-Up   Subjective: Ashley Orr is a 51 y.o. female who presents for follow-up.  Her concerns today include:  06/21/2022 Oncology per MD note: ASSESSMENT & PLAN:    Acute pulmonary embolism without acute cor pulmonale ([place]) This is a very pleasant 51 year old female patient with newly diagnosed acute segmental and subsegmental pulmonary embolism referred to hematology for additional recommendations.   Today we have discussed about risk factors for DVT/PE including such as.  Self immobilization, stasis, endothelial injury and hypercoagulable state per virtual started.  We have discussed about symptoms and signs of DVT as well as pulmonary embolism in detail.  We have discussed duration of anticoagulation which is typically 3 to 6 months for the first episode and mostly indefinite anticoagulation for recurrent episodes of DVT or pulmonary embolism.  We have recommended to avoid any form of estrogen supplementation or hormone replacement therapy for females.  We have discussed about risks of bleeding with anticoagulation including life-threatening bleeding.   With regards to hypercoagulable work-up, this is generally recommended for unprovoked blood clots under the age of 75 but can certainly be considered for other patients if it would change anticoagulation recommendations.  In her case since it is likely clearly a provoked episode of DVT/PE, I do not recommend any hypercoagulable work-up at this time.    I would not recommend any surgical procedures unless they are deemed emergent at least for 3 months.  She should return to clinic with [name] in 3 months and we will evaluate the need for further imaging.  She understands that she may have to continue anticoagulation for up to 6 months.   With regards to the iron deficiency, she is currently being treated with IV iron by OB/GYN.  We are happy to assist  with this as well.    she is agreeable to taking Eliquis for now.  She understands that the less expensive option is warfarin but she is not willing to do this because of frequent INR check that as needed. We have not drawn any labs today but we can repeat labs including iron panel and ferritin at her next visit.   No orders of the defined types were placed in this encounter.   Today's visit 08/28/2022: Patient states she only took Eliquis for 30 days due to cost. Last dose around 07/06/2022. Concerns of having to be on Eliquis long term stating she does not want this to be the case. Reports she received her first supply of Eliquis at hospital discharge free of cost. When she went to the pharmacy to pickup Eliquis it cost $300 out of pocket with her health insurance coverage. She tried to apply to [employer] assistance and use [place] but was told it was a lengthy process to get approved so decided against pursuing. She has been out of work since March 2023 due to medical procedures/treatments and as a result her funds were limited. She recently returned to work.   Established with Gynecology for management of fibroids and anemia. Has received 5 iron infusions over the last several months. Reports most recent menses did not have a heavy flow compared to previous but cramping still remaining. Possible fibroid-related procedure soon.   Has not taken Synthroid in 2 weeks. Reports she had several prescriptions of Synthroid from the past at her home that she was taking but ran out 2 weeks  ago. Denies financial concerns of affording Synthroid in comparison to Eliquis.   Today reports she is feeling well. She is exercising by walking routinely. Denies breathing issues and additional red flag symptoms. She is practicing deep breathing exercises and using the incentive spirometer at home. States her oncologist told her to hold scheduling appointments with Endocrinology and  Gastroenterology just in case she needs to have a procedure with Gynecology. Patient states she would like to try Qsymia to assist with weight loss.    Patient Active Problem List   Diagnosis Date Noted   Acute pulmonary embolism without acute cor pulmonale ([place]) 06/06/2022   Major depressive disorder 06/06/2022   Acute deep vein thrombosis (DVT) of tibial vein of left lower extremity ([place]) 06/06/2022   Dysfunctional uterine bleeding 04/13/2022   Cellulitis of face 12/17/2021   Iron deficiency anemia 01/13/2021   Smoker 01/12/2021   MDD (major depressive disorder), recurrent episode, moderate ([place]) 01/12/2021   Anemia 01/12/2021   Insomnia 01/12/2021   Stress incontinence 01/12/2021   Health care maintenance 01/01/2020   Essential hypertension 12/29/2019   Acquired hypothyroidism 12/29/2019     Current Outpatient Medications on File Prior to Visit  Medication Sig Dispense Refill   APIXABAN (ELIQUIS) VTE STARTER PACK ('10MG'$  AND '5MG'$ ) Take as directed on package: start with two-'5mg'$  tablets twice daily for 7 days. On day 8, switch to one-'5mg'$  tablet twice daily. 74 each 0   Ascorbic Acid (VITAMIN C PO) Take 60 mg by mouth daily. (Patient not taking: Reported on 06/16/2022)     buPROPion (WELLBUTRIN SR) 150 MG 12 hr tablet TAKE 1 TABLET BY MOUTH EVERY DAY 90 tablet 0   CASCARA SAGRADA PO Take 1 tablet by mouth daily as needed (Constipation).     Cholecalciferol 50 MCG (2000 UT) CAPS Take 2,000 Units by mouth daily.     citalopram (CELEXA) 40 MG tablet TAKE 1 TABLET BY MOUTH EVERY DAY (Patient taking differently: Take 40 mg by mouth daily as needed (for mood).) 90 tablet 0   Cranberry 500 MG CAPS Take 2 capsules by mouth daily. Probiotic-Prebiotic With vitamin C 60 mg     Cyanocobalamin (VITAMIN B-12) 5000 MCG SUBL Place 5,000 mcg under the tongue daily.     ferrous sulfate 325 (65 FE) MG tablet Take 325 mg by mouth daily with breakfast.     hydrochlorothiazide (HYDRODIURIL) 25 MG tablet Take  1 tablet (25 mg total) by mouth daily. 90 tablet 0   levothyroxine (SYNTHROID) 175 MCG tablet Take 1 tablet (175 mcg total) by mouth See admin instructions. Alternating with 200 mcg every other day 90 tablet 0   levothyroxine (SYNTHROID) 200 MCG tablet Take 1 tablet (200 mcg total) by mouth See admin instructions. Alternating with 175 mcg every other day. 90 tablet 0   losartan (COZAAR) 50 MG tablet Take 1 tablet (50 mg total) by mouth daily. 90 tablet 0   methocarbamol (ROBAXIN) 750 MG tablet Take 1 tablet (750 mg total) by mouth 4 (four) times daily. 60 tablet 2   naproxen (NAPROSYN) 500 MG tablet Take 1 tablet (500 mg total) by mouth 2 (two) times daily with a meal. Take with food 60 tablet 2   norethindrone (AYGESTIN) 5 MG tablet Take 5 mg by mouth daily.     ondansetron (ZOFRAN) 4 MG tablet Take 1 tablet (4 mg total) by mouth every 6 (six) hours as needed for nausea. 20 tablet 0   OVER THE COUNTER MEDICATION Take 1 Scoop by mouth  daily. Total Beets Juice 16 oz with water     phentermine 37.5 MG capsule Take 1 capsule (37.5 mg total) by mouth every morning. 30 capsule 0   polyethylene glycol powder (GLYCOLAX/MIRALAX) 17 GM/SCOOP powder Take 17 g by mouth daily as needed for mild constipation. 238 g 0   temazepam (RESTORIL) 30 MG capsule Take 30 mg by mouth at bedtime as needed for sleep.     traZODone (DESYREL) 100 MG tablet Take 1 tablet (100 mg total) by mouth at bedtime. 90 tablet 0   Venlafaxine HCl 37.5 MG TB24 Take 37.5 mg by mouth daily.     No current facility-administered medications on file prior to visit.    Allergies  Allergen Reactions   Codeine Anaphylaxis    Social History   Socioeconomic History   Marital status: Single    Spouse name: Not on file   Number of children: Not on file   Years of education: Not on file   Highest education level: Not on file  Occupational History   Not on file  Tobacco Use   Smoking status: Every Day    Packs/day: 1.00    Types:  Cigarettes    Passive exposure: Current   Smokeless tobacco: Never  Substance and Sexual Activity   Alcohol use: Not Currently   Drug use: Not Currently   Sexual activity: Yes    Birth control/protection: Surgical  Other Topics Concern   Not on file  Social History Narrative   Not on file   Social Determinants of Health   Financial Resource Strain: Not on file  Food Insecurity: Food Insecurity Present (05/22/2022)   Hunger Vital Sign    Worried About Running Out of Food in the Last Year: Sometimes true    Ran Out of Food in the Last Year: Sometimes true  Transportation Needs: No Transportation Needs (05/22/2022)   PRAPARE - Hydrologist (Medical): No    Lack of Transportation (Non-Medical): No  Physical Activity: Not on file  Stress: Not on file  Social Connections: Not on file  Intimate Partner Violence: Not on file    Family History  Problem Relation Age of Onset   Stroke Mother    COPD Mother    Hypertension Mother    Heart disease Father    Diabetes Father    Hypertension Father    Hypertension Brother    Diabetes Brother     Past Surgical History:  Procedure Laterality Date   APPENDECTOMY     CESAREAN SECTION     DILATATION & CURETTAGE/HYSTEROSCOPY WITH MYOSURE N/A 05/30/2022   Procedure: [place];  Surgeon: [name], MD;  Location: [facility];  Service: Gynecology;  Laterality: N/A;   DILITATION & CURRETTAGE/HYSTROSCOPY WITH HYDROTHERMAL ABLATION N/A 05/30/2022   Procedure: DILATATION & CURETTAGE/HYSTEROSCOPY WITH ATTEMPTED HYDROTHERMAL ABLATION;  Surgeon: [name], MD;  Location: [facility];  Service: Gynecology;  Laterality: N/A;   TUBAL LIGATION      ROS: Review of Systems Negative except as stated above  PHYSICAL EXAM: BP 138/84   Pulse 88   Temp 98.3 F (36.8 C)   Resp 16   Ht 5' 2.01" (1.575 m)   Wt 200 lb (90.7 kg)   SpO2 97%   BMI 36.57 kg/m   Physical  Exam HENT:     Head: Normocephalic and atraumatic.  Eyes:     Extraocular Movements: Extraocular movements intact.     Conjunctiva/sclera:  Conjunctivae normal.     Pupils: Pupils are equal, round, and reactive to light.  Cardiovascular:     Rate and Rhythm: Normal rate and regular rhythm.     Pulses: Normal pulses.     Heart sounds: Normal heart sounds.  Pulmonary:     Effort: Pulmonary effort is normal.     Breath sounds: Normal breath sounds.  Musculoskeletal:     Cervical back: Normal range of motion and neck supple.  Neurological:     General: No focal deficit present.     Mental Status: She is alert and oriented to person, place, and time.  Psychiatric:        Mood and Affect: Mood normal.        Behavior: Behavior normal.    ASSESSMENT AND PLAN: 1. Encounter for weight management - Discussed with patient I do not advise beginning Qsymia at this time due to side effect profile and medical history of pulmonary embolism.  - Discussed injectable weight loss medication such as Wegovy and possible related side effects. Patient elects to hold beginning [facility] for now until she has definite next steps from Gynecology on possible fibroid-related procedure.  - Patient declined referral to Medical Weight Management as of present. - Follow-up with primary provider as scheduled.   2. Hypothyroidism (acquired) - Update thyroid function.  - TSH  3. Acute pulmonary embolism without acute cor pulmonale, unspecified pulmonary embolism type ([place]) - Keep all scheduled appointments with Oncology. I discussed with patient per encounter note 06/21/2022 recommendations from Benay Pike, MD in regards to  hypercoagulable work-up. Patient is aware to keep appointment scheduled on 09/01/2022 with Benay Pike, MD for further advisement concerning the same. Patient verbalized understanding.  - I sent a message to Dicie Beam, MD to notify her of patient's report today concerning Eliquis and for  recommendations on next steps on when to refer patient back to Endocrinology.   4. Uterine leiomyoma, unspecified location 5. Anemia, unspecified type - Keep all scheduled appointments with Gynecology.    Patient was given the opportunity to ask questions.  Patient verbalized understanding of the plan and was able to repeat key elements of the plan. Patient was given clear instructions to go to Emergency Department or return to medical center if symptoms don't improve, worsen, or new problems develop.The patient verbalized understanding.   Orders Placed This Encounter  Procedures   TSH    Follow-up with primary provider as scheduled.   Camillia Herter, NP

## 2022-08-28 ENCOUNTER — Encounter: Payer: Self-pay | Admitting: Family

## 2022-08-28 ENCOUNTER — Ambulatory Visit (INDEPENDENT_AMBULATORY_CARE_PROVIDER_SITE_OTHER): Payer: 59 | Admitting: Family

## 2022-08-28 ENCOUNTER — Telehealth: Payer: Self-pay | Admitting: Family

## 2022-08-28 VITALS — BP 138/84 | HR 88 | Temp 98.3°F | Resp 16 | Ht 62.01 in | Wt 200.0 lb

## 2022-08-28 DIAGNOSIS — D259 Leiomyoma of uterus, unspecified: Secondary | ICD-10-CM

## 2022-08-28 DIAGNOSIS — Z7689 Persons encountering health services in other specified circumstances: Secondary | ICD-10-CM | POA: Diagnosis not present

## 2022-08-28 DIAGNOSIS — D649 Anemia, unspecified: Secondary | ICD-10-CM

## 2022-08-28 DIAGNOSIS — I2699 Other pulmonary embolism without acute cor pulmonale: Secondary | ICD-10-CM | POA: Diagnosis not present

## 2022-08-28 DIAGNOSIS — E039 Hypothyroidism, unspecified: Secondary | ICD-10-CM | POA: Diagnosis not present

## 2022-08-28 NOTE — Progress Notes (Signed)
Pt presents for blood work for pulmonary embolism, advised that appt on 10/13 will handle the blood work that she is requesting  -has not been on thyroid medication in 2 1/2 weeks  -pt wants to try phentermine w/Topamax combined (Qsymia)

## 2022-08-29 ENCOUNTER — Other Ambulatory Visit: Payer: Self-pay | Admitting: Family

## 2022-08-29 DIAGNOSIS — E039 Hypothyroidism, unspecified: Secondary | ICD-10-CM

## 2022-08-29 LAB — TSH: TSH: 87.8 u[IU]/mL — ABNORMAL HIGH (ref 0.450–4.500)

## 2022-08-29 MED ORDER — LEVOTHYROXINE SODIUM 300 MCG PO TABS: 300.0000 ug | ORAL_TABLET | Freq: Every day | ORAL | 2 refills | Status: DC

## 2022-08-31 ENCOUNTER — Other Ambulatory Visit: Payer: Self-pay | Admitting: *Deleted

## 2022-08-31 DIAGNOSIS — N938 Other specified abnormal uterine and vaginal bleeding: Secondary | ICD-10-CM

## 2022-08-31 DIAGNOSIS — D500 Iron deficiency anemia secondary to blood loss (chronic): Secondary | ICD-10-CM

## 2022-09-01 ENCOUNTER — Encounter: Payer: Self-pay | Admitting: Hematology and Oncology

## 2022-09-01 ENCOUNTER — Inpatient Hospital Stay (HOSPITAL_BASED_OUTPATIENT_CLINIC_OR_DEPARTMENT_OTHER): Payer: 59 | Admitting: Hematology and Oncology

## 2022-09-01 ENCOUNTER — Inpatient Hospital Stay: Payer: 59 | Attending: Hematology and Oncology

## 2022-09-01 DIAGNOSIS — F17210 Nicotine dependence, cigarettes, uncomplicated: Secondary | ICD-10-CM | POA: Insufficient documentation

## 2022-09-01 DIAGNOSIS — Z79890 Hormone replacement therapy (postmenopausal): Secondary | ICD-10-CM | POA: Diagnosis not present

## 2022-09-01 DIAGNOSIS — R69 Illness, unspecified: Secondary | ICD-10-CM | POA: Diagnosis not present

## 2022-09-01 DIAGNOSIS — N938 Other specified abnormal uterine and vaginal bleeding: Secondary | ICD-10-CM

## 2022-09-01 DIAGNOSIS — E039 Hypothyroidism, unspecified: Secondary | ICD-10-CM | POA: Insufficient documentation

## 2022-09-01 DIAGNOSIS — I2699 Other pulmonary embolism without acute cor pulmonale: Secondary | ICD-10-CM | POA: Diagnosis not present

## 2022-09-01 DIAGNOSIS — Z86718 Personal history of other venous thrombosis and embolism: Secondary | ICD-10-CM | POA: Diagnosis not present

## 2022-09-01 DIAGNOSIS — Z862 Personal history of diseases of the blood and blood-forming organs and certain disorders involving the immune mechanism: Secondary | ICD-10-CM | POA: Diagnosis not present

## 2022-09-01 DIAGNOSIS — D500 Iron deficiency anemia secondary to blood loss (chronic): Secondary | ICD-10-CM

## 2022-09-01 LAB — CBC WITH DIFFERENTIAL (CANCER CENTER ONLY)
Abs Immature Granulocytes: 0.02 10*3/uL (ref 0.00–0.07)
Basophils Absolute: 0 10*3/uL (ref 0.0–0.1)
Basophils Relative: 0 %
Eosinophils Absolute: 0.1 10*3/uL (ref 0.0–0.5)
Eosinophils Relative: 1 %
HCT: 40 % (ref 36.0–46.0)
Hemoglobin: 13.2 g/dL (ref 12.0–15.0)
Immature Granulocytes: 0 %
Lymphocytes Relative: 25 %
Lymphs Abs: 2.3 10*3/uL (ref 0.7–4.0)
MCH: 29.1 pg (ref 26.0–34.0)
MCHC: 33 g/dL (ref 30.0–36.0)
MCV: 88.1 fL (ref 80.0–100.0)
Monocytes Absolute: 0.6 10*3/uL (ref 0.1–1.0)
Monocytes Relative: 7 %
Neutro Abs: 6.4 10*3/uL (ref 1.7–7.7)
Neutrophils Relative %: 67 %
Platelet Count: 256 10*3/uL (ref 150–400)
RBC: 4.54 MIL/uL (ref 3.87–5.11)
RDW: 21.9 % — ABNORMAL HIGH (ref 11.5–15.5)
WBC Count: 9.4 10*3/uL (ref 4.0–10.5)
nRBC: 0 % (ref 0.0–0.2)

## 2022-09-01 LAB — CMP (CANCER CENTER ONLY)
ALT: 15 U/L (ref 0–44)
AST: 24 U/L (ref 15–41)
Albumin: 4.4 g/dL (ref 3.5–5.0)
Alkaline Phosphatase: 61 U/L (ref 38–126)
Anion gap: 10 (ref 5–15)
BUN: 11 mg/dL (ref 6–20)
CO2: 25 mmol/L (ref 22–32)
Calcium: 9.7 mg/dL (ref 8.9–10.3)
Chloride: 103 mmol/L (ref 98–111)
Creatinine: 1.4 mg/dL — ABNORMAL HIGH (ref 0.44–1.00)
GFR, Estimated: 45 mL/min — ABNORMAL LOW (ref >60)
Glucose, Bld: 137 mg/dL — ABNORMAL HIGH (ref 70–99)
Potassium: 3.5 mmol/L (ref 3.5–5.1)
Sodium: 138 mmol/L (ref 135–145)
Total Bilirubin: 0.4 mg/dL (ref 0.3–1.2)
Total Protein: 7.5 g/dL (ref 6.5–8.1)

## 2022-09-01 LAB — IRON AND IRON BINDING CAPACITY (CC-WL,HP ONLY)
Iron: 77 ug/dL (ref 28–170)
Saturation Ratios: 15 % (ref 10.4–31.8)
TIBC: 501 ug/dL — ABNORMAL HIGH (ref 250–450)
UIBC: 424 ug/dL (ref 148–442)

## 2022-09-01 LAB — FERRITIN: Ferritin: 19 ng/mL (ref 11–307)

## 2022-09-01 NOTE — Progress Notes (Signed)
[facility] CONSULT NOTE  Patient Care Team: Camillia Herter, NP as PCP - General (Nurse Practitioner)  CHIEF COMPLAINTS/PURPOSE OF CONSULTATION:  DVT, new consult  ASSESSMENT & PLAN:   Acute pulmonary embolism without acute cor pulmonale ([place]) This is a very pleasant 51 year old female patient with newly diagnosed acute segmental and subsegmental pulmonary embolism referred to hematology for additional recommendations.  During her initial visit, we have recommended anticoagulation for 3 to 6 months.  Apparently she ran out of it after 1 month and did not get it refilled since it cost her a lot-she lost her job and she had serious financial restrictions almost resulting in infection.  She today reports no worsening lower extremity swelling, chest pain or shortness of breath.  She has not been on any blood thinner for the past 2 months at least. With regards to her iron deficiency anemia, she feels much better, tolerated iron infusions very well.  Her CBC today shows complete resolution of anemia.  Since she has already discontinued blood thinners more than 2 months ago and has no worsening symptoms, I do not see any reason to restart this again especially with resolution of clinical symptoms.  She understands that if she has any symptoms concerning for DVT/PE which we discussed in detail today, she needs to go to the nearest ER.  If she ends up having a second episode of blood clot, she needs to take anticoagulation indefinitely.  At this point since her anemia is also resolved and she had endometrial ablation for menorrhagia, she can continue to follow-up with hematology as needed.  She was encouraged to call us with any new questions or concerns. We did talk about her mildly elevated creatinine today and reason to discuss this with her PCP and she expressed understanding  No orders of the defined types were placed in this encounter.    HISTORY OF PRESENTING ILLNESS:   Ashley Orr 51 y.o. female is here because of acute PE  This is a 51 year old female patient with no history of DVT or PE who was recently diagnosed with acute segmental and subsegmental PE referred to hematology for additional recommendations. Patient has been having some fibroid related pain and bleeding issues hence underwent endometrial ablation on July 11 and following this developed a severe charley horse-like pain in the left lower extremity and the day after felt very short of breath when she was trying to go up a flight of stairs.  She went to the urgent care and then was sent to the emergency room which showed acute pulmonary embolism.  Echocardiogram showed a normal right systolic ventricular function with an ejection fraction of 65 to 70%.  She was started on Eliquis.  She is currently on 5 mg p.o. twice daily.  She moved to [place] from [place].  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  She continues to deal with fibroid related bleeding.  She is otherwise tolerating Eliquis well. She does not have any prior history of DVT/PE.  No family history of hypercoagulable issues.  Interval history Since last visit she tells me that she only took the blood thinner for 1 month.  After that she could not obtain it because of financial issues.  She tells me that she did not have any money for any medication and quit all the medications.  She is now back on levothyroxine 300 mcg.  She  is now working again, started 3 weeks ago.  She denies any worsening lower extremity swelling in the left leg.  No new chest pain or shortness of breath.  She is able to function normally.  She also tolerated iron infusions very well. Rest of the pertinent 10 point ROS reviewed and negative.  MEDICAL HISTORY:  Past Medical History:  Diagnosis Date   Anemia    Hypertension    Hypothyroidism (acquired) 12/17/2021   MDD (major depressive  disorder)    Restless legs syndrome    Thyroid disease     SURGICAL HISTORY: Past Surgical History:  Procedure Laterality Date   APPENDECTOMY     CESAREAN SECTION     DILATATION & CURETTAGE/HYSTEROSCOPY WITH MYOSURE N/A 05/30/2022   Procedure: ATTEMPTED DILATATION & CURETTAGE/HYSTEROSCOPY WITH MYOSURE;  Surgeon: [name], MD;  Location: [facility];  Service: Gynecology;  Laterality: N/A;   DILITATION & CURRETTAGE/HYSTROSCOPY WITH HYDROTHERMAL ABLATION N/A 05/30/2022   Procedure: DILATATION & CURETTAGE/HYSTEROSCOPY WITH ATTEMPTED HYDROTHERMAL ABLATION;  Surgeon: [name], MD;  Location: [facility];  Service: Gynecology;  Laterality: N/A;   TUBAL LIGATION      SOCIAL HISTORY: Social History   Socioeconomic History   Marital status: Single    Spouse name: Not on file   Number of children: Not on file   Years of education: Not on file   Highest education level: Not on file  Occupational History   Not on file  Tobacco Use   Smoking status: Every Day    Packs/day: 1.00    Types: Cigarettes    Passive exposure: Current   Smokeless tobacco: Never  Substance and Sexual Activity   Alcohol use: Not Currently   Drug use: Not Currently   Sexual activity: Yes    Birth control/protection: Surgical  Other Topics Concern   Not on file  Social History Narrative   Not on file   Social Determinants of Health   Financial Resource Strain: Not on file  Food Insecurity: Food Insecurity Present (05/22/2022)   Hunger Vital Sign    Worried About Running Out of Food in the Last Year: Sometimes true    Ran Out of Food in the Last Year: Sometimes true  Transportation Needs: No Transportation Needs (05/22/2022)   PRAPARE - Hydrologist (Medical): No    Lack of Transportation (Non-Medical): No  Physical Activity: Not on file  Stress: Not on file  Social Connections: Not on file  Intimate Partner Violence: Not on file    FAMILY HISTORY: Family History  Problem  Relation Age of Onset   Stroke Mother    COPD Mother    Hypertension Mother    Heart disease Father    Diabetes Father    Hypertension Father    Hypertension Brother    Diabetes Brother     ALLERGIES:  is allergic to codeine.  MEDICATIONS:  Current Outpatient Medications  Medication Sig Dispense Refill   APIXABAN (ELIQUIS) VTE STARTER PACK ('10MG'$  AND '5MG'$ ) Take as directed on package: start with two-'5mg'$  tablets twice daily for 7 days. On day 8, switch to one-'5mg'$  tablet twice daily. 74 each 0   Ascorbic Acid (VITAMIN C PO) Take 60 mg by mouth daily. (Patient not taking: Reported on 06/16/2022)     buPROPion (WELLBUTRIN SR) 150 MG 12 hr tablet TAKE 1 TABLET BY MOUTH EVERY DAY 90 tablet 0   CASCARA SAGRADA PO Take 1 tablet by mouth daily as needed (Constipation).  Cholecalciferol 50 MCG (2000 UT) CAPS Take 2,000 Units by mouth daily.     citalopram (CELEXA) 40 MG tablet TAKE 1 TABLET BY MOUTH EVERY DAY (Patient taking differently: Take 40 mg by mouth daily as needed (for mood).) 90 tablet 0   Cranberry 500 MG CAPS Take 2 capsules by mouth daily. Probiotic-Prebiotic With vitamin C 60 mg     Cyanocobalamin (VITAMIN B-12) 5000 MCG SUBL Place 5,000 mcg under the tongue daily.     ferrous sulfate 325 (65 FE) MG tablet Take 325 mg by mouth daily with breakfast.     hydrochlorothiazide (HYDRODIURIL) 25 MG tablet Take 1 tablet (25 mg total) by mouth daily. 90 tablet 0   levothyroxine (SYNTHROID) 300 MCG tablet Take 1 tablet (300 mcg total) by mouth daily before breakfast. 30 tablet 2   losartan (COZAAR) 50 MG tablet Take 1 tablet (50 mg total) by mouth daily. 90 tablet 0   methocarbamol (ROBAXIN) 750 MG tablet Take 1 tablet (750 mg total) by mouth 4 (four) times daily. 60 tablet 2   naproxen (NAPROSYN) 500 MG tablet Take 1 tablet (500 mg total) by mouth 2 (two) times daily with a meal. Take with food 60 tablet 2   norethindrone (AYGESTIN) 5 MG tablet Take 5 mg by mouth daily.     ondansetron  (ZOFRAN) 4 MG tablet Take 1 tablet (4 mg total) by mouth every 6 (six) hours as needed for nausea. 20 tablet 0   OVER THE COUNTER MEDICATION Take 1 Scoop by mouth daily. Total Beets Juice 16 oz with water     phentermine 37.5 MG capsule Take 1 capsule (37.5 mg total) by mouth every morning. 30 capsule 0   polyethylene glycol powder (GLYCOLAX/MIRALAX) 17 GM/SCOOP powder Take 17 g by mouth daily as needed for mild constipation. 238 g 0   temazepam (RESTORIL) 30 MG capsule Take 30 mg by mouth at bedtime as needed for sleep.     traZODone (DESYREL) 100 MG tablet Take 1 tablet (100 mg total) by mouth at bedtime. 90 tablet 0   Venlafaxine HCl 37.5 MG TB24 Take 37.5 mg by mouth daily.     No current facility-administered medications for this visit.     PHYSICAL EXAMINATION: ECOG PERFORMANCE STATUS: 0 - Asymptomatic  Vitals:   09/01/22 1158  BP: (!) 147/109  Pulse: 96  Resp: 19  Temp: (!) 97.5 F (36.4 C)  SpO2: 100%   Filed Weights   09/01/22 1158  Weight: 205 lb 4 oz (93.1 kg)    Physical Exam Constitutional:      Appearance: Normal appearance.  Cardiovascular:     Rate and Rhythm: Normal rate and regular rhythm.     Pulses: Normal pulses.     Heart sounds: Normal heart sounds.  Pulmonary:     Effort: Pulmonary effort is normal.     Breath sounds: Normal breath sounds.  Musculoskeletal:        General: No swelling.     Cervical back: Normal range of motion and neck supple. No rigidity.  Lymphadenopathy:     Cervical: No cervical adenopathy.  Neurological:     Mental Status: She is alert.      LABORATORY DATA:  I have reviewed the data as listed Lab Results  Component Value Date   WBC 9.4 09/01/2022   HGB 13.2 09/01/2022   HCT 40.0 09/01/2022   MCV 88.1 09/01/2022   PLT 256 09/01/2022     Chemistry  Component Value Date/Time   NA 138 09/01/2022 1129   NA 139 12/09/2021 1043   K 3.5 09/01/2022 1129   CL 103 09/01/2022 1129   CO2 25 09/01/2022 1129    BUN 11 09/01/2022 1129   BUN 11 12/09/2021 1043   CREATININE 1.40 (H) 09/01/2022 1129      Component Value Date/Time   CALCIUM 9.7 09/01/2022 1129   ALKPHOS 61 09/01/2022 1129   AST 24 09/01/2022 1129   ALT 15 09/01/2022 1129   BILITOT 0.4 09/01/2022 1129       RADIOGRAPHIC STUDIES: I have personally reviewed the radiological images as listed and agreed with the findings in the report. No results found.  All questions were answered. The patient knows to call the clinic with any problems, questions or concerns. I spent 30 minutes in the care of this patient including H and P, review of records, counseling and coordination of care.     Benay Pike, MD 09/01/2022 3:27 PM

## 2022-09-01 NOTE — Assessment & Plan Note (Addendum)
This is a very pleasant 51 year old female patient with newly diagnosed acute segmental and subsegmental pulmonary embolism referred to hematology for additional recommendations.  During her initial visit, we have recommended anticoagulation for 3 to 6 months.  Apparently she ran out of it after 1 month and did not get it refilled since it cost her a lot-she lost her job and she had serious financial restrictions almost resulting in infection.  She today reports no worsening lower extremity swelling, chest pain or shortness of breath.  She has not been on any blood thinner for the past 2 months at least. With regards to her iron deficiency anemia, she feels much better, tolerated iron infusions very well.  Her CBC today shows complete resolution of anemia.  Since she has already discontinued blood thinners more than 2 months ago and has no worsening symptoms, I do not see any reason to restart this again especially with resolution of clinical symptoms.  She understands that if she has any symptoms concerning for DVT/PE which we discussed in detail today, she needs to go to the nearest ER.  If she ends up having a second episode of blood clot, she needs to take anticoagulation indefinitely.  At this point since her anemia is also resolved and she had endometrial ablation for menorrhagia, she can continue to follow-up with hematology as needed.  She was encouraged to call us with any new questions or concerns.

## 2022-09-04 ENCOUNTER — Other Ambulatory Visit: Payer: Self-pay | Admitting: Family

## 2022-09-04 DIAGNOSIS — M5431 Sciatica, right side: Secondary | ICD-10-CM

## 2022-09-04 DIAGNOSIS — N926 Irregular menstruation, unspecified: Secondary | ICD-10-CM

## 2022-09-07 NOTE — Telephone Encounter (Signed)
Called patient per I[place] in basket and patient did not answer. Will try patient back later.

## 2022-09-13 ENCOUNTER — Encounter: Payer: Self-pay | Admitting: *Deleted

## 2022-09-15 DIAGNOSIS — E039 Hypothyroidism, unspecified: Secondary | ICD-10-CM | POA: Diagnosis not present

## 2022-10-10 ENCOUNTER — Other Ambulatory Visit: Payer: Self-pay

## 2022-10-10 DIAGNOSIS — F32A Depression, unspecified: Secondary | ICD-10-CM

## 2022-10-10 DIAGNOSIS — I10 Essential (primary) hypertension: Secondary | ICD-10-CM

## 2022-10-10 MED ORDER — TRAZODONE HCL 100 MG PO TABS: 100.0000 mg | ORAL_TABLET | Freq: Every day | ORAL | 0 refills | Status: DC

## 2022-10-10 MED ORDER — CITALOPRAM HYDROBROMIDE 40 MG PO TABS: 40.0000 mg | ORAL_TABLET | Freq: Every day | ORAL | 0 refills | Status: DC

## 2022-10-10 MED ORDER — LOSARTAN POTASSIUM 50 MG PO TABS: 50.0000 mg | ORAL_TABLET | Freq: Every day | ORAL | 0 refills | Status: DC

## 2022-10-17 ENCOUNTER — Ambulatory Visit
Admission: EM | Admit: 2022-10-17 | Discharge: 2022-10-17 | Disposition: A | Discharge status: Other Acute Inpatient Hospital | Payer: 59 | Attending: Internal Medicine | Admitting: Internal Medicine

## 2022-10-17 DIAGNOSIS — R0789 Other chest pain: Secondary | ICD-10-CM

## 2022-10-17 DIAGNOSIS — R2243 Localized swelling, mass and lump, lower limb, bilateral: Secondary | ICD-10-CM

## 2022-10-17 NOTE — ED Provider Notes (Signed)
[facility]    CSN: [id number] Arrival date & time: 10/17/22  1303      History   Chief Complaint Chief Complaint  Patient presents with   Leg Swelling   pain with inspiration     L sided      HPI Ashley Orr is a 51 y.o. female.   Patient presents with pain with inspiration and bilateral leg swelling that started a few days ago.  Patient reports that it started the day after she traveled on the bus back from [place].  She states that she has pain when taking a deep breath.  Denies any associated shortness of breath, headache, dizziness, blurred vision, nausea or vomiting.  Denies any associated cough, upper respiratory symptoms, fever.  Patient reports bilateral lower extremity swelling but no associated pain.  Denies any numbness or tingling.  Patient does have history of DVT and PE a few months prior following surgry.  She took blood thinner for a month and stopped taking it given that she "did not want to take it anymore".     Past Medical History:  Diagnosis Date   Anemia    Hypertension    Hypothyroidism (acquired) 12/17/2021   MDD (major depressive disorder)    Restless legs syndrome    Thyroid disease     Patient Active Problem List   Diagnosis Date Noted   Acute pulmonary embolism without acute cor pulmonale ([place]) 06/06/2022   Major depressive disorder 06/06/2022   Acute deep vein thrombosis (DVT) of tibial vein of left lower extremity ([place]) 06/06/2022   Dysfunctional uterine bleeding 04/13/2022   Cellulitis of face 12/17/2021   Iron deficiency anemia 01/13/2021   Smoker 01/12/2021   MDD (major depressive disorder), recurrent episode, moderate ([place]) 01/12/2021   Anemia 01/12/2021   Insomnia 01/12/2021   Stress incontinence 01/12/2021   Health care maintenance 01/01/2020   Essential hypertension 12/29/2019   Acquired hypothyroidism 12/29/2019    Past Surgical History:  Procedure Laterality Date   APPENDECTOMY     CESAREAN  SECTION     DILATATION & CURETTAGE/HYSTEROSCOPY WITH MYOSURE N/A 05/30/2022   Procedure: [place];  Surgeon: [name], MD;  Location: [facility];  Service: Gynecology;  Laterality: N/A;   DILITATION & CURRETTAGE/HYSTROSCOPY WITH HYDROTHERMAL ABLATION N/A 05/30/2022   Procedure: DILATATION & CURETTAGE/HYSTEROSCOPY WITH ATTEMPTED HYDROTHERMAL ABLATION;  Surgeon: [name], MD;  Location: [facility];  Service: Gynecology;  Laterality: N/A;   TUBAL LIGATION      OB History     Gravida  6   Para  3   Term  0   Preterm      AB  3   Living  3      SAB  2   IAB  1   Ectopic      Multiple      Live Births               Home Medications    Prior to Admission medications   Medication Sig Start Date End Date Taking? Authorizing Provider  APIXABAN (ELIQUIS) VTE STARTER PACK ('10MG'$  AND '5MG'$ ) Take as directed on package: start with two-'5mg'$  tablets twice daily for 7 days. On day 8, switch to one-'5mg'$  tablet twice daily. Patient not taking: Reported on 10/17/2022 06/06/22   Godfrey Pick, MD  Ascorbic Acid (VITAMIN C PO) Take 60 mg by mouth daily. Patient not taking: Reported on 06/16/2022    [provider]  buPROPion (WELLBUTRIN SR) 150 MG 12 hr tablet TAKE 1 TABLET BY MOUTH EVERY DAY 07/26/22 10/24/22  Camillia Herter, NP  CASCARA SAGRADA PO Take 1 tablet by mouth daily as needed (Constipation).    [provider]  Cholecalciferol 50 MCG (2000 UT) CAPS Take 2,000 Units by mouth daily.    [provider]  citalopram (CELEXA) 40 MG tablet Take 1 tablet (40 mg total) by mouth daily. 10/10/22   Camillia Herter, NP  Cranberry 500 MG CAPS Take 2 capsules by mouth daily. Probiotic-Prebiotic With vitamin C 60 mg    [provider]  Cyanocobalamin (VITAMIN B-12) 5000 MCG SUBL Place 5,000 mcg under the tongue daily.    [provider]  ferrous sulfate 325 (65 FE) MG tablet Take 325 mg by mouth daily  with breakfast.    [provider]  hydrochlorothiazide (HYDRODIURIL) 25 MG tablet Take 1 tablet (25 mg total) by mouth daily. 02/22/22 06/07/23  Camillia Herter, NP  levothyroxine (SYNTHROID) 300 MCG tablet Take 1 tablet (300 mcg total) by mouth daily before breakfast. 08/29/22 11/27/22  Camillia Herter, NP  losartan (COZAAR) 50 MG tablet Take 1 tablet (50 mg total) by mouth daily. 10/10/22   Camillia Herter, NP  methocarbamol (ROBAXIN) 750 MG tablet Take 1 tablet (750 mg total) by mouth 4 (four) times daily. 06/16/22   [name], MD  naproxen (NAPROSYN) 500 MG tablet Take 1 tablet (500 mg total) by mouth 2 (two) times daily with a meal. Take with food 06/29/22   [name], MD  norethindrone (AYGESTIN) 5 MG tablet Take 5 mg by mouth daily. 07/10/22   [provider]  ondansetron (ZOFRAN) 4 MG tablet Take 1 tablet (4 mg total) by mouth every 6 (six) hours as needed for nausea. 06/06/22   Sheikh, Omair Latif, DO  OVER THE COUNTER MEDICATION Take 1 Scoop by mouth daily. Total Beets Juice 16 oz with water    [provider]  phentermine 37.5 MG capsule Take 1 capsule (37.5 mg total) by mouth every morning. 04/14/22 06/07/23  Camillia Herter, NP  polyethylene glycol powder (GLYCOLAX/MIRALAX) 17 GM/SCOOP powder Take 17 g by mouth daily as needed for mild constipation. 06/06/22   Raiford Noble Latif, DO  temazepam (RESTORIL) 30 MG capsule Take 30 mg by mouth at bedtime as needed for sleep.    [provider]  traZODone (DESYREL) 100 MG tablet Take 1 tablet (100 mg total) by mouth at bedtime. 10/10/22   Camillia Herter, NP  Venlafaxine HCl 37.5 MG TB24 Take 37.5 mg by mouth daily.    [provider]    Family History Family History  Problem Relation Age of Onset   Stroke Mother    COPD Mother    Hypertension Mother    Heart disease Father    Diabetes Father    Hypertension Father    Hypertension Brother    Diabetes Brother     Social History Social  History   Tobacco Use   Smoking status: Every Day    Packs/day: 0.75    Types: Cigarettes    Passive exposure: Current   Smokeless tobacco: Never  Substance Use Topics   Alcohol use: Not Currently   Drug use: Not Currently     Allergies   Codeine   Review of Systems Review of Systems Per HPI  Physical Exam Triage Vital Signs ED Triage Vitals  Enc Vitals Group     BP  10/17/22 1615 135/88     Pulse Rate 10/17/22 1615 87     Resp 10/17/22 1615 (!) 24     Temp 10/17/22 1615 98.1 F (36.7 C)     Temp Source 10/17/22 1615 Oral     SpO2 10/17/22 1615 97 %     Weight --      Height --      Head Circumference --      Peak Flow --      Pain Score 10/17/22 1612 9     Pain Loc --      Pain Edu? --      Excl. in [place]? --    No data found.  Updated Vital Signs BP 135/88   Pulse 87   Temp 98.1 F (36.7 C) (Oral)   Resp (!) 24   LMP  (Within Months) Comment: pt is on nora  SpO2 97%   Visual Acuity Right Eye Distance:   Left Eye Distance:   Bilateral Distance:    Right Eye Near:   Left Eye Near:    Bilateral Near:     Physical Exam Constitutional:      General: She is not in acute distress.    Appearance: Normal appearance. She is not toxic-appearing or diaphoretic.  HENT:     Head: Normocephalic and atraumatic.  Eyes:     Extraocular Movements: Extraocular movements intact.     Conjunctiva/sclera: Conjunctivae normal.     Pupils: Pupils are equal, round, and reactive to light.  Cardiovascular:     Rate and Rhythm: Normal rate and regular rhythm.     Pulses: Normal pulses.     Heart sounds: Normal heart sounds.  Pulmonary:     Effort: No respiratory distress.     Breath sounds: No stridor. No wheezing, rhonchi or rales.     Comments: Mild tachypnea noted.  Skin:    Comments: Nonpitting edema circumferential to bilateral ankles as well as to bilateral feet.  No obvious discoloration noted.  Pedal pulses and capillary refill normal.  No tenderness to  palpation.  Neurological:     General: No focal deficit present.     Mental Status: She is alert and oriented to person, place, and time. Mental status is at baseline.  Psychiatric:        Mood and Affect: Mood normal.        Behavior: Behavior normal.        Thought Content: Thought content normal.        Judgment: Judgment normal.      UC Treatments / Results  Labs (all labs ordered are listed, but only abnormal results are displayed) Labs Reviewed - No data to display  EKG   Radiology No results found.  Procedures Procedures (including critical care time)  Medications Ordered in UC Medications - No data to display  Initial Impression / Assessment and Plan / UC Course  I have reviewed the triage vital signs and the nursing notes.  Pertinent labs & imaging results that were available during my care of the patient were reviewed by me and considered in my medical decision making (see chart for details).     EKG was normal sinus rhythm.  Given patient's history of blood clots and patient no longer taking blood thinners, this is concerning for complications related to this.  Given recent long distance travel, this is also a risk factor.  I do think that more advanced evaluation and imaging is necessary.  Do not  have these resources here in urgent care so patient was advised to go to the ER for further evaluation and management.  Patient was agreeable with plan.  Given EKG was normal and vital signs are stable, agree with patient self transport to the hospital. Final Clinical Impressions(s) / UC Diagnoses   Final diagnoses:  Localized swelling, mass, or lump of lower extremity, bilateral  Other chest pain     Discharge Instructions      Please go to the emergency department as soon as you leave urgent care for further evaluation and management.    ED Prescriptions   None    PDMP not reviewed this encounter.   Teodora Medici, [place] 10/17/22 [phone]

## 2022-10-17 NOTE — ED Triage Notes (Signed)
Pt presents to uc with co of pain during inspiration or when she takes a deep breath. Pt reports the pain is 9/10 and she has lower leg swelling. Pt reports she rode a bus from [place] earlier this week and has had swelling since travel

## 2022-10-17 NOTE — ED Notes (Signed)
Patient is being discharged from the Urgent Care and sent to the Emergency Department via pov . Per [place] np, patient is in need of higher level of care due to symptoms and pmh. Patient is aware and verbalizes understanding of plan of care.  Vitals:   10/17/22 1615  BP: 135/88  Pulse: 87  Resp: (!) 24  Temp: 98.1 F (36.7 C)  SpO2: 97%

## 2022-10-17 NOTE — Discharge Instructions (Addendum)
Please go to the emergency department as soon as you leave urgent care for further evaluation and management. ?

## 2022-10-19 NOTE — Progress Notes (Signed)
Patient ID: Ashley Orr, female    DOB: Apr 26, 1971  MRN: [id number]  CC: Chronic Care Management   Subjective: Ashley Orr is a 51 y.o. female who presents for chronic care management.  Her concerns today include:  - Doing well on blood pressure medications, no issues/concerns. Wants to know if she can stop taking Hydrochlorothiazide. Discussed with patient to continue for now and will reassess at follow-up appointment in 3 months. Patient agreeable.  - States doesn't feel depressed. Taking antidepressants on as needed basis. - Since last visit established with Endocrinology for hypothyroidism management.  - Concern for fibroids and anemia due to increased vaginal bleeding with pain. She has an appointment scheduled with Gynecology in January 2024. Encouraged to call to see if they can see her sooner.  - Ready for referral to Weight Management.  - Since urgent care visit on 10/17/2022 feeling improved. Feet back to normal. Denies red flag symptoms today in office.       10/27/2022    1:36 PM 08/28/2022    8:45 AM 06/16/2022   10:29 AM 05/22/2022    1:30 PM 04/14/2022    9:38 AM  Depression screen PHQ 2/9  Decreased Interest 0 0 1 1 0  Down, Depressed, Hopeless 0 0 1 1 0  PHQ - 2 Score 0 0 2 2 0  Altered sleeping '1  1 1   '$ Tired, decreased energy '1  1 1   '$ Change in appetite 1  1 0   Feeling bad or failure about yourself  0  0 0   Trouble concentrating 0  1 1   Moving slowly or fidgety/restless 0  0 0   Suicidal thoughts 0  0 0   PHQ-9 Score '3  6 5   '$ Difficult doing work/chores Not difficult at all        Patient Active Problem List   Diagnosis Date Noted   Acute pulmonary embolism without acute cor pulmonale (HCC) 06/06/2022   Major depressive disorder 06/06/2022   Acute deep vein thrombosis (DVT) of tibial vein of left lower extremity ([place]) 06/06/2022   Dysfunctional uterine bleeding 04/13/2022   Cellulitis of face 12/17/2021   Iron deficiency anemia  01/13/2021   Smoker 01/12/2021   MDD (major depressive disorder), recurrent episode, moderate ([place]) 01/12/2021   Anemia 01/12/2021   Insomnia 01/12/2021   Stress incontinence 01/12/2021   Health care maintenance 01/01/2020   Essential hypertension 12/29/2019   Acquired hypothyroidism 12/29/2019     Current Outpatient Medications on File Prior to Visit  Medication Sig Dispense Refill   levothyroxine (SYNTHROID) 200 MCG tablet Take 200 mcg by mouth daily before breakfast.     levothyroxine (SYNTHROID) 50 MCG tablet Take 50 mcg by mouth daily before breakfast.     APIXABAN (ELIQUIS) VTE STARTER PACK ('10MG'$  AND '5MG'$ ) Take as directed on package: start with two-'5mg'$  tablets twice daily for 7 days. On day 8, switch to one-'5mg'$  tablet twice daily. (Patient not taking: Reported on 10/17/2022) 74 each 0   Ascorbic Acid (VITAMIN C PO) Take 60 mg by mouth daily. (Patient not taking: Reported on 06/16/2022)     CASCARA SAGRADA PO Take 1 tablet by mouth daily as needed (Constipation).     Cholecalciferol 50 MCG (2000 UT) CAPS Take 2,000 Units by mouth daily.     citalopram (CELEXA) 40 MG tablet Take 1 tablet (40 mg total) by mouth daily. 90 tablet 0   Cranberry 500 MG CAPS Take 2 capsules by  mouth daily. Probiotic-Prebiotic With vitamin C 60 mg     Cyanocobalamin (VITAMIN B-12) 5000 MCG SUBL Place 5,000 mcg under the tongue daily.     ferrous sulfate 325 (65 FE) MG tablet Take 325 mg by mouth daily with breakfast.     losartan (COZAAR) 50 MG tablet Take 1 tablet (50 mg total) by mouth daily. 90 tablet 0   methocarbamol (ROBAXIN) 750 MG tablet Take 1 tablet (750 mg total) by mouth 4 (four) times daily. 60 tablet 2   naproxen (NAPROSYN) 500 MG tablet Take 1 tablet (500 mg total) by mouth 2 (two) times daily with a meal. Take with food 60 tablet 2   norethindrone (AYGESTIN) 5 MG tablet Take 5 mg by mouth daily.     ondansetron (ZOFRAN) 4 MG tablet Take 1 tablet (4 mg total) by mouth every 6 (six) hours as  needed for nausea. 20 tablet 0   OVER THE COUNTER MEDICATION Take 1 Scoop by mouth daily. Total Beets Juice 16 oz with water     phentermine 37.5 MG capsule Take 1 capsule (37.5 mg total) by mouth every morning. 30 capsule 0   polyethylene glycol powder (GLYCOLAX/MIRALAX) 17 GM/SCOOP powder Take 17 g by mouth daily as needed for mild constipation. 238 g 0   temazepam (RESTORIL) 30 MG capsule Take 30 mg by mouth at bedtime as needed for sleep.     traZODone (DESYREL) 100 MG tablet Take 1 tablet (100 mg total) by mouth at bedtime. 90 tablet 0   Venlafaxine HCl 37.5 MG TB24 Take 37.5 mg by mouth daily.     No current facility-administered medications on file prior to visit.    Allergies  Allergen Reactions   Codeine Anaphylaxis    Social History   Socioeconomic History   Marital status: Single    Spouse name: Not on file   Number of children: Not on file   Years of education: Not on file   Highest education level: Not on file  Occupational History   Not on file  Tobacco Use   Smoking status: Every Day    Packs/day: 0.75    Types: Cigarettes    Passive exposure: Current   Smokeless tobacco: Never  Substance and Sexual Activity   Alcohol use: Not Currently   Drug use: Not Currently   Sexual activity: Yes    Birth control/protection: Surgical  Other Topics Concern   Not on file  Social History Narrative   Not on file   Social Determinants of Health   Financial Resource Strain: Not on file  Food Insecurity: Food Insecurity Present (05/22/2022)   Hunger Vital Sign    Worried About Running Out of Food in the Last Year: Sometimes true    Ran Out of Food in the Last Year: Sometimes true  Transportation Needs: No Transportation Needs (05/22/2022)   PRAPARE - Hydrologist (Medical): No    Lack of Transportation (Non-Medical): No  Physical Activity: Not on file  Stress: Not on file  Social Connections: Not on file  Intimate Partner Violence: Not on  file    Family History  Problem Relation Age of Onset   Stroke Mother    COPD Mother    Hypertension Mother    Heart disease Father    Diabetes Father    Hypertension Father    Hypertension Brother    Diabetes Brother     Past Surgical History:  Procedure Laterality Date   APPENDECTOMY  CESAREAN SECTION     DILATATION & CURETTAGE/HYSTEROSCOPY WITH MYOSURE N/A 05/30/2022   Procedure: ATTEMPTED DILATATION & CURETTAGE/HYSTEROSCOPY WITH MYOSURE;  Surgeon: [name], MD;  Location: [facility];  Service: Gynecology;  Laterality: N/A;   DILITATION & CURRETTAGE/HYSTROSCOPY WITH HYDROTHERMAL ABLATION N/A 05/30/2022   Procedure: DILATATION & CURETTAGE/HYSTEROSCOPY WITH ATTEMPTED HYDROTHERMAL ABLATION;  Surgeon: [name], MD;  Location: [facility];  Service: Gynecology;  Laterality: N/A;   TUBAL LIGATION      ROS: Review of Systems Negative except as stated above  PHYSICAL EXAM: BP 131/89 (BP Location: Left Arm, Patient Position: Sitting, Cuff Size: Large)   Pulse 90   Temp 98.3 F (36.8 C)   Resp 16   Ht 5' 2.01" (1.575 m)   Wt 212 lb (96.2 kg)   SpO2 97%   BMI 38.77 kg/m    Wt Readings from Last 3 Encounters:  10/27/22 212 lb (96.2 kg)  09/01/22 205 lb 4 oz (93.1 kg)  08/28/22 200 lb (90.7 kg)   Physical Exam HENT:     Head: Normocephalic and atraumatic.  Eyes:     Extraocular Movements: Extraocular movements intact.     Conjunctiva/sclera: Conjunctivae normal.     Pupils: Pupils are equal, round, and reactive to light.  Cardiovascular:     Rate and Rhythm: Normal rate and regular rhythm.     Pulses: Normal pulses.     Heart sounds: Normal heart sounds.  Pulmonary:     Effort: Pulmonary effort is normal.     Breath sounds: Normal breath sounds.  Musculoskeletal:     Cervical back: Normal range of motion and neck supple.     Right foot: Normal.     Left foot: Normal.  Neurological:     General: No focal deficit present.     Mental Status: She is alert and  oriented to person, place, and time.  Psychiatric:        Mood and Affect: Mood normal.        Behavior: Behavior normal.     ASSESSMENT AND PLAN: 1. Primary hypertension - Continue Losartan as prescribed. No refills needed as of present.  - Continue Hydrochlorothiazide as prescribed.  - Counseled on blood pressure goal of less than 130/80, low-sodium, DASH diet, medication compliance, and 150 minutes of moderate intensity exercise per week as tolerated. Counseled on medication adherence and adverse effects. - Follow-up with primary provider in 3 months or sooner if needed.  - hydrochlorothiazide (HYDRODIURIL) 25 MG tablet; Take 1 tablet (25 mg total) by mouth daily.  Dispense: 30 tablet; Refill: 2  2. Depression, unspecified depression type - Patient denies thoughts of self-harm, suicidal ideations, homicidal ideations. - Continue Citalopram and Trazodone as prescribed. No refills needed as of present.  - Continue Bupropion as prescribed.  - Follow-up with primary provider in 3 months or sooner if needed.  - buPROPion (WELLBUTRIN SR) 150 MG 12 hr tablet; Take 1 tablet (150 mg total) by mouth daily.  Dispense: 30 tablet; Refill: 2  3. Hypothyroidism (acquired) - Keep all scheduled appointments at Endocrinology.   4. Encounter for weight management 5. Class 2 severe obesity with serious comorbidity and body mass index (BMI) of 38.0 to 38.9 in adult, unspecified obesity type ([place]) 6. Weight gain - Referral to Medical Weight Management for further evaluation/management.  - Amb Ref to Medical Weight Management  Patient was given the opportunity to ask questions.  Patient verbalized understanding of the plan and was able to repeat key  elements of the plan. Patient was given clear instructions to go to Emergency Department or return to medical center if symptoms don't improve, worsen, or new problems develop.The patient verbalized understanding.   Orders Placed This Encounter  Procedures    Amb Ref to Medical Weight Management     Requested Prescriptions   Signed Prescriptions Disp Refills   hydrochlorothiazide (HYDRODIURIL) 25 MG tablet 30 tablet 2    Sig: Take 1 tablet (25 mg total) by mouth daily.   buPROPion (WELLBUTRIN SR) 150 MG 12 hr tablet 30 tablet 2    Sig: Take 1 tablet (150 mg total) by mouth daily.    Return in about 3 months (around 01/26/2023) for Follow-Up or next available chronic care mgmt .  Camillia Herter, NP

## 2022-10-27 ENCOUNTER — Ambulatory Visit (INDEPENDENT_AMBULATORY_CARE_PROVIDER_SITE_OTHER): Payer: 59 | Admitting: Family

## 2022-10-27 VITALS — BP 131/89 | HR 90 | Temp 98.3°F | Resp 16 | Ht 62.01 in | Wt 212.0 lb

## 2022-10-27 DIAGNOSIS — E039 Hypothyroidism, unspecified: Secondary | ICD-10-CM | POA: Diagnosis not present

## 2022-10-27 DIAGNOSIS — F32A Depression, unspecified: Secondary | ICD-10-CM

## 2022-10-27 DIAGNOSIS — R635 Abnormal weight gain: Secondary | ICD-10-CM

## 2022-10-27 DIAGNOSIS — I10 Essential (primary) hypertension: Secondary | ICD-10-CM

## 2022-10-27 DIAGNOSIS — Z6838 Body mass index (BMI) 38.0-38.9, adult: Secondary | ICD-10-CM | POA: Diagnosis not present

## 2022-10-27 DIAGNOSIS — Z7689 Persons encountering health services in other specified circumstances: Secondary | ICD-10-CM

## 2022-10-27 DIAGNOSIS — R69 Illness, unspecified: Secondary | ICD-10-CM | POA: Diagnosis not present

## 2022-10-27 MED ORDER — BUPROPION HCL ER (SR) 150 MG PO TB12: 150.0000 mg | ORAL_TABLET | Freq: Every day | ORAL | 2 refills | Status: DC

## 2022-10-27 MED ORDER — HYDROCHLOROTHIAZIDE 25 MG PO TABS: 25.0000 mg | ORAL_TABLET | Freq: Every day | ORAL | 2 refills | Status: DC

## 2022-10-27 NOTE — Patient Instructions (Signed)
Please call [facility] Phone #: [phone] [address] [place], [place] [address].   Hypothyroidism  Hypothyroidism is when the thyroid gland does not make enough of certain hormones. This is called an underactive thyroid. The thyroid gland is a small gland located in the lower front part of the neck, just in front of the windpipe (trachea). This gland makes hormones that help control how the body uses food for energy (metabolism) as well as how the heart and brain function. These hormones also play a role in keeping your bones strong. When the thyroid is underactive, it produces too little of the hormones thyroxine (T4) and triiodothyronine (T3). What are the causes? This condition may be caused by: Hashimoto's disease. This is a disease in which the body's disease-fighting system (immune system) attacks the thyroid gland. This is the most common cause. Viral infections. Pregnancy. Certain medicines. Birth defects. Problems with a gland in the center of the brain (pituitary gland). Lack of enough iodine in the diet. Other causes may include: Past radiation treatments to the head or neck for cancer. Past treatment with radioactive iodine. Past exposure to radiation in the environment. Past surgical removal of part or all of the thyroid. What increases the risk? You are more likely to develop this condition if: You are female. You have a family history of thyroid conditions. You use a medicine called lithium. You take medicines that affect the immune system (immunosuppressants). What are the signs or symptoms? Common symptoms of this condition include: Not being able to tolerate cold. Feeling as though you have no energy (lethargy). Lack of appetite. Constipation. Sadness or depression. Weight gain that is not explained by a change in diet or exercise habits. Menstrual irregularity. Dry skin,  coarse hair, or brittle nails. Other symptoms may include: Muscle pain. Slowing of thought processes. Poor memory. How is this diagnosed? This condition may be diagnosed based on: Your symptoms, your medical history, and a physical exam. Blood tests. You may also have imaging tests, such as an ultrasound or MRI. How is this treated? This condition is treated with medicine that replaces the thyroid hormones that your body does not make. After you begin treatment, it may take several weeks for symptoms to go away. Follow these instructions at home: Take over-the-counter and prescription medicines only as told by your health care provider. If you start taking any new medicines, tell your health care provider. Keep all follow-up visits as told by your health care provider. This is important. As your condition improves, your dosage of thyroid hormone medicine may change. You will need to have blood tests regularly so that your health care provider can monitor your condition. Contact a health care provider if: Your symptoms do not get better with treatment. You are taking thyroid hormone replacement medicine and you: Sweat a lot. Have tremors. Feel anxious. Lose weight rapidly. Cannot tolerate heat. Have emotional swings. Have diarrhea. Feel weak. Get help right away if: You have chest pain. You have an irregular heartbeat. You have a rapid heartbeat. You have difficulty breathing. These symptoms may be an emergency. Get help right away. Call 911. Do not wait to see if the symptoms will go away. Do not drive yourself to the hospital. Summary Hypothyroidism is when the thyroid gland does not make enough of certain hormones (it is underactive). When the thyroid is underactive, it produces too little of the hormones thyroxine (T4) and triiodothyronine (T3). The most  common cause is Hashimoto's disease, a disease in which the body's disease-fighting system (immune system) attacks the  thyroid gland. The condition can also be caused by viral infections, medicine, pregnancy, or past radiation treatment to the head or neck. Symptoms may include weight gain, dry skin, constipation, feeling as though you do not have energy, and not being able to tolerate cold. This condition is treated with medicine to replace the thyroid hormones that your body does not make. This information is not intended to replace advice given to you by your health care provider. Make sure you discuss any questions you have with your health care provider. Document Revised: 11/08/2021 Document Reviewed: 11/08/2021 Elsevier Patient Education  [place].

## 2022-10-27 NOTE — Progress Notes (Signed)
Pt presents for follow-up on weight  -states that its becoming hard to lose weight -pt experiencing menorrhagia again asked if spoke to gyn about issue pt states does not have appt till January

## 2022-10-31 ENCOUNTER — Other Ambulatory Visit: Payer: Self-pay | Admitting: Family

## 2022-10-31 DIAGNOSIS — F32A Depression, unspecified: Secondary | ICD-10-CM

## 2022-10-31 NOTE — Telephone Encounter (Signed)
Alternative med requested- PA required for dose

## 2022-11-15 ENCOUNTER — Other Ambulatory Visit: Payer: Self-pay | Admitting: Family Medicine

## 2022-11-15 ENCOUNTER — Other Ambulatory Visit: Payer: Self-pay | Admitting: Family

## 2022-11-15 DIAGNOSIS — E039 Hypothyroidism, unspecified: Secondary | ICD-10-CM

## 2022-11-15 DIAGNOSIS — N938 Other specified abnormal uterine and vaginal bleeding: Secondary | ICD-10-CM

## 2022-12-01 ENCOUNTER — Encounter: Payer: Self-pay | Admitting: Family Medicine

## 2022-12-01 ENCOUNTER — Ambulatory Visit (INDEPENDENT_AMBULATORY_CARE_PROVIDER_SITE_OTHER): Payer: 59 | Admitting: Family Medicine

## 2022-12-01 VITALS — BP 150/102 | HR 78 | Wt 202.6 lb

## 2022-12-01 DIAGNOSIS — D252 Subserosal leiomyoma of uterus: Secondary | ICD-10-CM | POA: Diagnosis not present

## 2022-12-01 DIAGNOSIS — D251 Intramural leiomyoma of uterus: Secondary | ICD-10-CM | POA: Diagnosis not present

## 2022-12-01 DIAGNOSIS — Z1211 Encounter for screening for malignant neoplasm of colon: Secondary | ICD-10-CM | POA: Diagnosis not present

## 2022-12-01 DIAGNOSIS — N938 Other specified abnormal uterine and vaginal bleeding: Secondary | ICD-10-CM | POA: Diagnosis not present

## 2022-12-01 DIAGNOSIS — D500 Iron deficiency anemia secondary to blood loss (chronic): Secondary | ICD-10-CM | POA: Diagnosis not present

## 2022-12-01 LAB — CBC
Hematocrit: 37.9 % (ref 34.0–46.6)
Hemoglobin: 12.2 g/dL (ref 11.1–15.9)
MCH: 28.1 pg (ref 26.6–33.0)
MCHC: 32.2 g/dL (ref 31.5–35.7)
MCV: 87 fL (ref 79–97)
Platelets: 237 10*3/uL (ref 150–450)
RBC: 4.34 x10E6/uL (ref 3.77–5.28)
RDW: 14.6 % (ref 11.7–15.4)
WBC: 7.7 10*3/uL (ref 3.4–10.8)

## 2022-12-01 NOTE — Assessment & Plan Note (Signed)
Repeat CBC and iron infusion if needed.

## 2022-12-01 NOTE — Progress Notes (Signed)
   Subjective:    Patient ID: Ashley Orr is a 52 y.o. female presenting with Follow-up  on 12/01/2022  HPI: Patient here for f/u. Has h/o menorrhagia. Attempt at HTA and D and C was not succesul. Noted to have submucosal fibroid at that time. Has been bleeding x 3 months. Not heavy or cramping but bleeding daily. Craves ice taking a liquid supplement. Previously with 4.1 cm fibroids on u/s in 2022 at [place]. No recent imaging. S/p BTL and previous C-s x 3. Strongly declines hyst. Negative endometrial sampling 05/2022.   Review of Systems  Constitutional:  Negative for chills and fever.  Respiratory:  Negative for shortness of breath.   Cardiovascular:  Negative for chest pain.  Gastrointestinal:  Negative for abdominal pain, nausea and vomiting.  Genitourinary:  Negative for dysuria.  Skin:  Negative for rash.      Objective:    BP (!) 146/94   Pulse 84   Wt 202 lb 9.6 oz (91.9 kg)   BMI 37.05 kg/m  Physical Exam Exam conducted with a chaperone present.  Constitutional:      General: She is not in acute distress.    Appearance: She is well-developed.  HENT:     Head: Normocephalic and atraumatic.  Eyes:     General: No scleral icterus. Cardiovascular:     Rate and Rhythm: Normal rate.  Pulmonary:     Effort: Pulmonary effort is normal.  Abdominal:     Palpations: Abdomen is soft.  Musculoskeletal:     Cervical back: Neck supple.  Skin:    General: Skin is warm and dry.  Neurological:     Mental Status: She is alert and oriented to person, place, and time.         Assessment & Plan:   Problem List Items Addressed This Visit       Unprioritized   Iron deficiency anemia    Repeat CBC and iron infusion if needed.      Relevant Orders   CBC   Dysfunctional uterine bleeding - Primary    Check u/s--for referral to IR for UFE. Still strongly desires to keep her parts intact. Continue Aygestin. After UFE, may decrease dosing. Has h/o DVT off  Eliquis now.      Relevant Orders   CBC   Intramural and subserous leiomyoma of uterus    Seen on u/s at [facility] and submucosal noted at time of hysteroscopy, which could not be safely treated due to significant leak at cervix and inability to see well enough.      Relevant Orders   Ambulatory referral to Interventional Radiology   Other Visit Diagnoses     Screen for colon cancer       will do cologuard   Relevant Orders   Cologuard       Return in about 3 months (around 03/02/2023) for a follow-up.  [name], MD 12/01/2022 9:46 AM

## 2022-12-01 NOTE — Assessment & Plan Note (Addendum)
Check u/s--for referral to IR for UFE. Still strongly desires to keep her parts intact. Continue Aygestin. After UFE, may decrease dosing. Has h/o DVT off Eliquis now.

## 2022-12-01 NOTE — Assessment & Plan Note (Addendum)
Seen on u/s at [facility] and submucosal noted at time of hysteroscopy, which could not be safely treated due to significant leak at cervix and inability to see well enough.

## 2022-12-06 ENCOUNTER — Other Ambulatory Visit: Payer: Self-pay | Admitting: Family Medicine

## 2022-12-06 ENCOUNTER — Ambulatory Visit (HOSPITAL_COMMUNITY)
Admission: RE | Admit: 2022-12-06 | Discharge: 2022-12-06 | Disposition: A | Discharge status: Home / Self Care | Payer: 59 | Source: Ambulatory Visit | Attending: Family Medicine | Admitting: Family Medicine

## 2022-12-06 DIAGNOSIS — N939 Abnormal uterine and vaginal bleeding, unspecified: Secondary | ICD-10-CM

## 2022-12-06 DIAGNOSIS — Z1231 Encounter for screening mammogram for malignant neoplasm of breast: Secondary | ICD-10-CM

## 2022-12-06 DIAGNOSIS — D500 Iron deficiency anemia secondary to blood loss (chronic): Secondary | ICD-10-CM

## 2022-12-06 DIAGNOSIS — E039 Hypothyroidism, unspecified: Secondary | ICD-10-CM

## 2022-12-06 DIAGNOSIS — Z1211 Encounter for screening for malignant neoplasm of colon: Secondary | ICD-10-CM

## 2022-12-06 DIAGNOSIS — I10 Essential (primary) hypertension: Secondary | ICD-10-CM

## 2022-12-06 DIAGNOSIS — F17200 Nicotine dependence, unspecified, uncomplicated: Secondary | ICD-10-CM

## 2022-12-06 DIAGNOSIS — D259 Leiomyoma of uterus, unspecified: Secondary | ICD-10-CM | POA: Diagnosis not present

## 2022-12-14 ENCOUNTER — Other Ambulatory Visit: Payer: Self-pay | Admitting: Family Medicine

## 2022-12-14 DIAGNOSIS — N938 Other specified abnormal uterine and vaginal bleeding: Secondary | ICD-10-CM

## 2022-12-18 DIAGNOSIS — E039 Hypothyroidism, unspecified: Secondary | ICD-10-CM | POA: Diagnosis not present

## 2022-12-27 ENCOUNTER — Other Ambulatory Visit: Payer: Self-pay | Admitting: Family

## 2022-12-27 ENCOUNTER — Other Ambulatory Visit: Payer: Self-pay | Admitting: Family Medicine

## 2022-12-27 ENCOUNTER — Telehealth: Payer: Self-pay | Admitting: *Deleted

## 2022-12-27 DIAGNOSIS — D250 Submucous leiomyoma of uterus: Secondary | ICD-10-CM

## 2022-12-27 DIAGNOSIS — I10 Essential (primary) hypertension: Secondary | ICD-10-CM

## 2022-12-27 DIAGNOSIS — F32A Depression, unspecified: Secondary | ICD-10-CM

## 2022-12-27 NOTE — Telephone Encounter (Signed)
Pt left phone message stating that she would like to scheduled the Radiology procedure. Per chart review, Dr. Kennon Rounds recommends interventional Radiology for treatment of fibroid. I notified Interventional Radiology dept of pt's wishes to move forward and was told that they will reach out to pt and get her scheduled. I called pt and informed her of this information. She voiced understanding.

## 2022-12-28 ENCOUNTER — Other Ambulatory Visit: Payer: Self-pay

## 2022-12-28 DIAGNOSIS — F32A Depression, unspecified: Secondary | ICD-10-CM

## 2022-12-28 DIAGNOSIS — I10 Essential (primary) hypertension: Secondary | ICD-10-CM

## 2022-12-28 MED ORDER — CITALOPRAM HYDROBROMIDE 40 MG PO TABS: 40.0000 mg | ORAL_TABLET | Freq: Every day | ORAL | 0 refills | Status: DC

## 2022-12-28 MED ORDER — LOSARTAN POTASSIUM 50 MG PO TABS: 50.0000 mg | ORAL_TABLET | Freq: Every day | ORAL | 0 refills | Status: DC

## 2022-12-28 NOTE — Telephone Encounter (Signed)
Call patient with update.   - Patient's request for Restoril is a benzodiazepine. Our office does not prescribe benzodiazepines. Please let me know if patient would like referral to Psychiatry.  - Venlafaxine has not been prescribed by me for patient's anxiety depression. During last visit 10/27/2022 patient confirmed that she is taking Citalopram, Trazodone, and Bupropion. Please schedule appointment so that I can discuss with patient in detail request for change in medication regimen. - Aygestin managed by Gynecology.

## 2022-12-29 ENCOUNTER — Other Ambulatory Visit: Payer: Self-pay

## 2022-12-29 DIAGNOSIS — N938 Other specified abnormal uterine and vaginal bleeding: Secondary | ICD-10-CM

## 2022-12-29 MED ORDER — NAPROXEN 500 MG PO TABS: 500.0000 mg | ORAL_TABLET | Freq: Two times a day (BID) | ORAL | 2 refills | Status: DC

## 2023-01-01 ENCOUNTER — Inpatient Hospital Stay: Admission: RE | Admit: 2023-01-01 | Payer: 59 | Source: Ambulatory Visit

## 2023-01-11 ENCOUNTER — Ambulatory Visit
Admission: RE | Admit: 2023-01-11 | Discharge: 2023-01-11 | Disposition: A | Discharge status: Home / Self Care | Payer: 59 | Source: Ambulatory Visit | Attending: Family Medicine | Admitting: Family Medicine

## 2023-01-11 DIAGNOSIS — D250 Submucous leiomyoma of uterus: Secondary | ICD-10-CM

## 2023-01-11 DIAGNOSIS — D259 Leiomyoma of uterus, unspecified: Secondary | ICD-10-CM | POA: Diagnosis not present

## 2023-01-11 DIAGNOSIS — N938 Other specified abnormal uterine and vaginal bleeding: Secondary | ICD-10-CM | POA: Diagnosis not present

## 2023-01-11 HISTORY — PX: IR RADIOLOGIST EVAL & MGMT: IMG5224

## 2023-01-14 NOTE — H&P (Signed)
Reason for visit: Dysfunctional bleeding from uterine fibroids   Care Team(s): Primary Care: Camillia Herter, NP GYN: [name] MD  History of Present Illness:  Ashley Orr is a 52 y.o. female G5P3 who is referred for dysfunctional uterine bleeding. Pt with long standing uterine fibroids and heavy menstrual and peri-menstrual bleeding. She reports an initial diagnosis of fibroids when she was but 52 y/o, and that she has been bleeding non-stop since 09/2022. Pt has had prior attempts at managing her uterine bleeding, including endometrial ablation and DC, all of which have reportedly been unsuccessful. She is currently on hormone therapy with aygestin. 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  She does not desire future pregnancy, given that her 3 children are adults, but declined a hysterectomy. She was referred by her OB to interventional radiology to discuss minimally invasive therapy with embolization.   Pt describes her symptoms as fatigue, debilitating cramping pain, spotting between menses, and abnormal uterine bleeding with her last period being heavy and lasting for >7 days.  She endorses bulk symptoms with frequent urinary urge but denies constipation. Her LMP was 12/25/22. 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 Endometrial biopsy (05/22/22) and curettage (05/30/22) were negative.   Review of Systems: A 12 point ROS discussed and pertinent positives are indicated in the HPI above.  All other systems are negative.   Past Medical History:  Diagnosis Date   Anemia    Hypertension    Hypothyroidism (acquired) 12/17/2021   MDD (major depressive disorder)    Restless legs syndrome    Thyroid disease     Past Surgical History:  Procedure Laterality Date   APPENDECTOMY     CESAREAN SECTION      DILATATION & CURETTAGE/HYSTEROSCOPY WITH MYOSURE N/A 05/30/2022   Procedure: ATTEMPTED DILATATION & CURETTAGE/HYSTEROSCOPY WITH MYOSURE;  Surgeon: [name], MD;  Location: [place];  Service: Gynecology;  Laterality: N/A;   DILITATION & CURRETTAGE/HYSTROSCOPY WITH HYDROTHERMAL ABLATION N/A 05/30/2022   Procedure: DILATATION & CURETTAGE/HYSTEROSCOPY WITH ATTEMPTED HYDROTHERMAL ABLATION;  Surgeon: [name], MD;  Location: [place];  Service: Gynecology;  Laterality: N/A;   IR RADIOLOGIST EVAL & MGMT  01/11/2023   TUBAL LIGATION      Allergies: Codeine  Medications: Prior to Admission medications   Medication Sig Start Date End Date Taking? Authorizing Provider  Ascorbic Acid (VITAMIN C PO) Take 60 mg by mouth daily.    [provider]  buPROPion (WELLBUTRIN SR) 150 MG 12 hr tablet Take 1 tablet (150 mg total) by mouth daily. 10/27/22 01/25/23  Camillia Herter, NP  CASCARA SAGRADA PO Take 1 tablet by mouth daily as needed (Constipation).    [provider]  Cholecalciferol 50 MCG (2000 UT) CAPS Take 2,000 Units by mouth daily.    [provider]  citalopram (CELEXA) 40 MG tablet Take 1 tablet (40 mg total) by mouth daily. 12/28/22   Camillia Herter, NP  Cranberry 500 MG CAPS Take 2 capsules by mouth daily. Probiotic-Prebiotic With vitamin C 60 mg    [provider]  Cyanocobalamin (VITAMIN B-12) 5000 MCG SUBL Place 5,000 mcg under the tongue daily.    [provider]  ferrous sulfate 325 (65 FE) MG tablet Take 325 mg by mouth daily with breakfast.    [provider]  hydrochlorothiazide (HYDRODIURIL) 25  MG tablet Take 1 tablet (25 mg total) by mouth daily. 10/27/22 01/25/23  Camillia Herter, NP  levothyroxine (SYNTHROID) 300 MCG tablet TAKE 1 TABLET (300 MCG TOTAL) BY MOUTH DAILY BEFORE BREAKFAST. 11/15/22 02/13/23  Camillia Herter, NP  losartan (COZAAR) 50 MG tablet Take 1 tablet (50 mg total) by mouth daily. 12/28/22   Camillia Herter, NP   methocarbamol (ROBAXIN) 750 MG tablet Take 1 tablet (750 mg total) by mouth 4 (four) times daily. 06/16/22   [name], MD  naproxen (NAPROSYN) 500 MG tablet Take 1 tablet (500 mg total) by mouth 2 (two) times daily with a meal. Take with food 12/29/22   Camillia Herter, NP  norethindrone (AYGESTIN) 5 MG tablet Take 10 mg by mouth daily. 07/10/22   [provider]  OVER THE COUNTER MEDICATION Take 1 Scoop by mouth daily. Total Beets Juice 16 oz with water    [provider]  polyethylene glycol powder (GLYCOLAX/MIRALAX) 17 GM/SCOOP powder Take 17 g by mouth daily as needed for mild constipation. 06/06/22   Raiford Noble Latif, DO  temazepam (RESTORIL) 30 MG capsule Take 30 mg by mouth at bedtime as needed for sleep.    [provider]  traZODone (DESYREL) 100 MG tablet Take 1 tablet (100 mg total) by mouth at bedtime. 10/10/22   Camillia Herter, NP  Venlafaxine HCl 37.5 MG TB24 Take 37.5 mg by mouth daily.    [provider]     Family History  Problem Relation Age of Onset   Stroke Mother    COPD Mother    Hypertension Mother    Heart disease Father    Diabetes Father    Hypertension Father    Hypertension Brother    Diabetes Brother     Social History   Socioeconomic History   Marital status: Single    Spouse name: Not on file   Number of children: Not on file   Years of education: Not on file   Highest education level: Not on file  Occupational History   Not on file  Tobacco Use   Smoking status: Every Day    Packs/day: 0.75    Types: Cigarettes    Passive exposure: Current   Smokeless tobacco: Never  Substance and Sexual Activity   Alcohol use: Not Currently   Drug use: Not Currently   Sexual activity: Yes    Birth control/protection: Surgical  Other Topics Concern   Not on file  Social History Narrative   Not on file   Social Determinants of Health   Financial Resource Strain: Not on file  Food Insecurity: Food Insecurity  Present (05/22/2022)   Hunger Vital Sign    Worried About Running Out of Food in the Last Year: Sometimes true    Ran Out of Food in the Last Year: Sometimes true  Transportation Needs: No Transportation Needs (05/22/2022)   PRAPARE - Hydrologist (Medical): No    Lack of Transportation (Non-Medical): No  Physical Activity: Not on file  Stress: Not on file  Social Connections: Not on file    Review of Systems As above  Vital Signs: BP 125/76   Pulse 90   Temp 98.6 F (37 C) (Oral)   Wt 90.3 kg   SpO2 100% Comment: room ait  BMI 36.39 kg/m   Physical Exam  General: WN, NAD  CV: RRR on monitor Pulm: normal work of breathing on RA Abd: S, ND,  NT MSK: Grossly normal Psych: Appropriate affect.   Imaging: US Pelvis, 12/06/22 Independently reviewed, demonstrating an enlarged and fibroid uterus.    Labs:  CBC: Recent Labs    06/05/22 1646 06/06/22 0505 06/06/22 1308 09/01/22 1129 12/01/22 1023  WBC 9.6 6.8  --  9.4 7.7  HGB 8.6* 7.5* 8.1* 13.2 12.2  HCT 30.6* 27.0* 28.8* 40.0 37.9  PLT 272 231  --  256 237    COAGS: Recent Labs    06/06/22 0505  INR 1.2  APTT 36    BMP: Recent Labs    05/30/22 1342 06/05/22 1646 06/06/22 0505 09/01/22 1129  NA 135 138 140 138  K 3.3* 2.7* 3.1* 3.5  CL 104 103 110 103  CO2 17* '23 23 25  '$ GLUCOSE 101* 115* 105* 137*  BUN 10 <5* 5* 11  CALCIUM 9.4 9.2 8.7* 9.7  CREATININE 1.15* 0.94 0.79 1.40*  GFRNONAA 57* >60 >60 45*    Pathology:  SURGICAL PATHOLOGY  CASE: [id number]  PATIENT: Brand Males  Surgical Pathology Report   Clinical History: AUB (ms)   FINAL MICROSCOPIC DIAGNOSIS:   A. ENDOMETRIAL CURETTAGE:  Fragments of a benign endometrial polyp. Additional fragments of endometrium with changes consistent with progesterone effect.  Negative for endometrial hyperplasia and malignancy.     Assessment and Plan:  Ms.Trenna Guadelupe Brevig is a 52 y.o. year old female  who presents with symptomatic uterine fibroids and dysfunctional menstrual bleeding.  She is interested in pursuing a minimally-invasive option for the treatment of her fibroids at this time, and is curious about Uterine Artery Embolization. She does not desire future pregnancies.  Review of systems is otherwise negative.  The procedure has been fully reviewed with the patient/patient's authorized representative. The risks, benefits and alternatives have been explained, and the patient/patient's authorized representative has consented to the procedure.  I recommended that secondary to her lack of contrasted cross-sectional imaging available to evaluate her uterus and fibroid burden enhancement, a contrasted MRI pelvis is warranted.   *MR Pelvis +C to evaluate for fibroid enhancement *OK to proceed to schedule based on mutual availability. *Procedure to be performed at [facility] *Will plan for trans-radial access. *Same day procedure, no overnight admission.     Thank you for this interesting consult.  I greatly enjoyed meeting Andreya Wingrove and look forward to participating in their care.  A copy of this report was sent to the requesting provider on this date.  Signed,  Michaelle Birks, MD [facility] Radiology   Pager. [phone] Clinic. [phone]   I spent a total of 60 Minutes  in face to face in clinical consultation, greater than 50% of which was counseling/coordinating care for Mrs Nyssa Piscitelli dysfunctional uterine bleeding.

## 2023-01-15 ENCOUNTER — Other Ambulatory Visit: Payer: Self-pay | Admitting: Interventional Radiology

## 2023-01-15 DIAGNOSIS — D259 Leiomyoma of uterus, unspecified: Secondary | ICD-10-CM

## 2023-01-16 NOTE — Progress Notes (Signed)
Patient ID: Ashley Orr, female    DOB: 1971-05-29  MRN: [id number]  CC: Chronic Care Management   Subjective: Ashley Orr is a 52 y.o. female who presents for chronic care management.   Her concerns today include:  - Doing well on blood pressure medications, no issues/concerns. She denies red flag symptoms such as but not limited to chest pain, shortness of breath, worst headache of life, nausea/vomiting.  - Doing well on depression medications, no issues/concerns. Reports taking Trazodone only as needed at bedtime. She denies thoughts of self-harm, suicidal ideations, homicidal ideations.  Patient Active Problem List   Diagnosis Date Noted   Intramural and subserous leiomyoma of uterus 12/01/2022   Acute pulmonary embolism without acute cor pulmonale (HCC) 06/06/2022   Major depressive disorder 06/06/2022   Acute deep vein thrombosis (DVT) of tibial vein of left lower extremity ([place]) 06/06/2022   Dysfunctional uterine bleeding 04/13/2022   Cellulitis of face 12/17/2021   Iron deficiency anemia 01/13/2021   Smoker 01/12/2021   MDD (major depressive disorder), recurrent episode, moderate ([place]) 01/12/2021   Anemia 01/12/2021   Insomnia 01/12/2021   Stress incontinence 01/12/2021   Health care maintenance 01/01/2020   Essential hypertension 12/29/2019   Acquired hypothyroidism 12/29/2019     Current Outpatient Medications on File Prior to Visit  Medication Sig Dispense Refill   Ascorbic Acid (VITAMIN C PO) Take 60 mg by mouth daily.     CASCARA SAGRADA PO Take 1 tablet by mouth daily as needed (Constipation).     Cholecalciferol 50 MCG (2000 UT) CAPS Take 2,000 Units by mouth daily.     citalopram (CELEXA) 40 MG tablet Take 1 tablet (40 mg total) by mouth daily. 90 tablet 0   Cranberry 500 MG CAPS Take 2 capsules by mouth daily. Probiotic-Prebiotic With vitamin C 60 mg     Cyanocobalamin (VITAMIN B-12) 5000 MCG SUBL Place 5,000 mcg under the tongue daily.      ferrous sulfate 325 (65 FE) MG tablet Take 325 mg by mouth daily with breakfast.     levothyroxine (SYNTHROID) 300 MCG tablet TAKE 1 TABLET (300 MCG TOTAL) BY MOUTH DAILY BEFORE BREAKFAST. 30 tablet 2   losartan (COZAAR) 50 MG tablet Take 1 tablet (50 mg total) by mouth daily. 90 tablet 0   methocarbamol (ROBAXIN) 750 MG tablet Take 1 tablet (750 mg total) by mouth 4 (four) times daily. 60 tablet 2   naproxen (NAPROSYN) 500 MG tablet Take 1 tablet (500 mg total) by mouth 2 (two) times daily with a meal. Take with food 60 tablet 2   norethindrone (AYGESTIN) 5 MG tablet Take 10 mg by mouth daily.     OVER THE COUNTER MEDICATION Take 1 Scoop by mouth daily. Total Beets Juice 16 oz with water     polyethylene glycol powder (GLYCOLAX/MIRALAX) 17 GM/SCOOP powder Take 17 g by mouth daily as needed for mild constipation. 238 g 0   temazepam (RESTORIL) 30 MG capsule Take 30 mg by mouth at bedtime as needed for sleep.     Venlafaxine HCl 37.5 MG TB24 Take 37.5 mg by mouth daily.     No current facility-administered medications on file prior to visit.    Allergies  Allergen Reactions   Codeine Anaphylaxis    Social History   Socioeconomic History   Marital status: Single    Spouse name: Not on file   Number of children: Not on file   Years of education: Not on file  Highest education level: Not on file  Occupational History   Not on file  Tobacco Use   Smoking status: Every Day    Packs/day: 0.75    Types: Cigarettes    Passive exposure: Current   Smokeless tobacco: Never  Substance and Sexual Activity   Alcohol use: Not Currently   Drug use: Not Currently   Sexual activity: Yes    Birth control/protection: Surgical  Other Topics Concern   Not on file  Social History Narrative   Not on file   Social Determinants of Health   Financial Resource Strain: Not on file  Food Insecurity: Food Insecurity Present (05/22/2022)   Hunger Vital Sign    Worried About Running Out of Food in  the Last Year: Sometimes true    Ran Out of Food in the Last Year: Sometimes true  Transportation Needs: No Transportation Needs (05/22/2022)   PRAPARE - Hydrologist (Medical): No    Lack of Transportation (Non-Medical): No  Physical Activity: Not on file  Stress: Not on file  Social Connections: Not on file  Intimate Partner Violence: Not on file    Family History  Problem Relation Age of Onset   Stroke Mother    COPD Mother    Hypertension Mother    Heart disease Father    Diabetes Father    Hypertension Father    Hypertension Brother    Diabetes Brother     Past Surgical History:  Procedure Laterality Date   APPENDECTOMY     CESAREAN SECTION     DILATATION & CURETTAGE/HYSTEROSCOPY WITH MYOSURE N/A 05/30/2022   Procedure: [place];  Surgeon: [name], MD;  Location: [facility];  Service: Gynecology;  Laterality: N/A;   DILITATION & CURRETTAGE/HYSTROSCOPY WITH HYDROTHERMAL ABLATION N/A 05/30/2022   Procedure: DILATATION & CURETTAGE/HYSTEROSCOPY WITH ATTEMPTED HYDROTHERMAL ABLATION;  Surgeon: [name], MD;  Location: [facility];  Service: Gynecology;  Laterality: N/A;   IR RADIOLOGIST EVAL & MGMT  01/11/2023   TUBAL LIGATION      ROS: Review of Systems Negative except as stated above  PHYSICAL EXAM: BP 129/87 (BP Location: Left Arm, Patient Position: Sitting, Cuff Size: Large)   Pulse 89   Temp 98.3 F (36.8 C)   Resp 16   Ht 5' 2.01" (1.575 m)   Wt 204 lb (92.5 kg)   SpO2 96%   BMI 37.30 kg/m   Physical Exam HENT:     Head: Normocephalic and atraumatic.  Eyes:     Extraocular Movements: Extraocular movements intact.     Conjunctiva/sclera: Conjunctivae normal.     Pupils: Pupils are equal, round, and reactive to light.  Cardiovascular:     Rate and Rhythm: Normal rate and regular rhythm.     Pulses: Normal pulses.     Heart sounds: Normal heart sounds.  Pulmonary:     Effort:  Pulmonary effort is normal.     Breath sounds: Normal breath sounds.  Musculoskeletal:     Cervical back: Normal range of motion and neck supple.  Neurological:     General: No focal deficit present.     Mental Status: She is alert and oriented to person, place, and time.  Psychiatric:        Mood and Affect: Mood normal.        Behavior: Behavior normal.     ASSESSMENT AND PLAN: 1. Primary hypertension - Continue Losartan as prescribed. No refills needed as  of present.  - Continue Hydrochlorothiazide as prescribed.  - Routine screening.  - Counseled on blood pressure goal of less than 130/80, low-sodium, DASH diet, medication compliance, and 150 minutes of moderate intensity exercise per week as tolerated. Counseled on medication adherence and adverse effects. - Follow-up with primary provider in 6 months or sooner if needed.  - Basic Metabolic Panel - hydrochlorothiazide (HYDRODIURIL) 25 MG tablet; Take 1 tablet (25 mg total) by mouth daily.  Dispense: 90 tablet; Refill: 0  2. Diabetes mellitus screening - Routine screening.  - Hemoglobin A1c  3. Screening cholesterol level - Routine screening.  - Lipid panel  4. Depression, unspecified depression type - Patient denies thoughts of self-harm, suicidal ideations, homicidal ideations. - Continue Citalopram as prescribed. No refills needed as of present.  - Continue Trazodone as prescribed.  - Continue Bupropion as prescribed.  - Follow-up with primary provider in 6 months or sooner if needed.  - traZODone (DESYREL) 100 MG tablet; Take 1 tablet (100 mg total) by mouth at bedtime.  Dispense: 30 tablet; Refill: 2 - buPROPion (WELLBUTRIN SR) 150 MG 12 hr tablet; Take 1 tablet (150 mg total) by mouth daily.  Dispense: 30 tablet; Refill: 2   Patient was given the opportunity to ask questions.  Patient verbalized understanding of the plan and was able to repeat key elements of the plan. Patient was given clear instructions to go to  Emergency Department or return to medical center if symptoms don't improve, worsen, or new problems develop.The patient verbalized understanding.   Orders Placed This Encounter  Procedures   Lipid panel   Hemoglobin [id number]   Basic Metabolic Panel    Requested Prescriptions   Signed Prescriptions Disp Refills   hydrochlorothiazide (HYDRODIURIL) 25 MG tablet 90 tablet 0    Sig: Take 1 tablet (25 mg total) by mouth daily.   traZODone (DESYREL) 100 MG tablet 30 tablet 2    Sig: Take 1 tablet (100 mg total) by mouth at bedtime.   buPROPion (WELLBUTRIN SR) 150 MG 12 hr tablet 30 tablet 2    Sig: Take 1 tablet (150 mg total) by mouth daily.    Return in about 6 months (around 07/22/2023) for Follow-Up or next available chronic care mgmt .  Camillia Herter, NP

## 2023-01-18 ENCOUNTER — Ambulatory Visit (HOSPITAL_COMMUNITY): Payer: 59

## 2023-01-19 ENCOUNTER — Ambulatory Visit (INDEPENDENT_AMBULATORY_CARE_PROVIDER_SITE_OTHER): Payer: 59 | Admitting: Family

## 2023-01-19 ENCOUNTER — Encounter: Payer: Self-pay | Admitting: Family

## 2023-01-19 VITALS — BP 129/87 | HR 89 | Temp 98.3°F | Resp 16 | Ht 62.01 in | Wt 204.0 lb

## 2023-01-19 DIAGNOSIS — F32A Depression, unspecified: Secondary | ICD-10-CM

## 2023-01-19 DIAGNOSIS — Z13220 Encounter for screening for lipoid disorders: Secondary | ICD-10-CM

## 2023-01-19 DIAGNOSIS — Z131 Encounter for screening for diabetes mellitus: Secondary | ICD-10-CM

## 2023-01-19 DIAGNOSIS — I10 Essential (primary) hypertension: Secondary | ICD-10-CM | POA: Diagnosis not present

## 2023-01-19 DIAGNOSIS — F17210 Nicotine dependence, cigarettes, uncomplicated: Secondary | ICD-10-CM | POA: Diagnosis not present

## 2023-01-19 MED ORDER — TRAZODONE HCL 100 MG PO TABS: 100.0000 mg | ORAL_TABLET | Freq: Every day | ORAL | 2 refills | Status: DC

## 2023-01-19 MED ORDER — BUPROPION HCL ER (SR) 150 MG PO TB12: 150.0000 mg | ORAL_TABLET | Freq: Every day | ORAL | 2 refills | Status: DC

## 2023-01-19 MED ORDER — HYDROCHLOROTHIAZIDE 25 MG PO TABS: 25.0000 mg | ORAL_TABLET | Freq: Every day | ORAL | 0 refills | Status: DC

## 2023-01-19 NOTE — Progress Notes (Signed)
.  Pt presents for chronic care management

## 2023-01-20 LAB — BASIC METABOLIC PANEL
BUN/Creatinine Ratio: 9 (ref 9–23)
BUN: 9 mg/dL (ref 6–24)
CO2: 22 mmol/L (ref 20–29)
Calcium: 9.3 mg/dL (ref 8.7–10.2)
Chloride: 102 mmol/L (ref 96–106)
Creatinine, Ser: 1 mg/dL (ref 0.57–1.00)
Glucose: 81 mg/dL (ref 70–99)
Potassium: 3.7 mmol/L (ref 3.5–5.2)
Sodium: 142 mmol/L (ref 134–144)
eGFR: 67 mL/min/{1.73_m2} (ref >59)

## 2023-01-20 LAB — LIPID PANEL
Chol/HDL Ratio: 3.2 ratio (ref 0.0–4.4)
Cholesterol, Total: 181 mg/dL (ref 100–199)
HDL: 56 mg/dL (ref >39)
LDL Chol Calc (NIH): 102 mg/dL — ABNORMAL HIGH (ref 0–99)
Triglycerides: 132 mg/dL (ref 0–149)
VLDL Cholesterol Cal: 23 mg/dL (ref 5–40)

## 2023-01-20 LAB — HEMOGLOBIN A1C
Est. average glucose Bld gHb Est-mCnc: 105 mg/dL
Hgb A1c MFr Bld: 5.3 % (ref 4.8–5.6)

## 2023-01-24 ENCOUNTER — Ambulatory Visit (HOSPITAL_COMMUNITY)
Admission: RE | Admit: 2023-01-24 | Discharge: 2023-01-24 | Disposition: A | Discharge status: Home / Self Care | Payer: 59 | Source: Ambulatory Visit | Attending: Interventional Radiology | Admitting: Interventional Radiology

## 2023-01-24 DIAGNOSIS — D251 Intramural leiomyoma of uterus: Secondary | ICD-10-CM | POA: Diagnosis not present

## 2023-01-24 DIAGNOSIS — N83202 Unspecified ovarian cyst, left side: Secondary | ICD-10-CM | POA: Diagnosis not present

## 2023-01-24 DIAGNOSIS — D259 Leiomyoma of uterus, unspecified: Secondary | ICD-10-CM | POA: Diagnosis not present

## 2023-01-24 MED ORDER — GADOBUTROL 1 MMOL/ML IV SOLN
9.0000 mL | Freq: Once | INTRAVENOUS | Status: AC | PRN
  Administered 2023-01-24: 9 mL via INTRAVENOUS

## 2023-01-25 ENCOUNTER — Encounter (INDEPENDENT_AMBULATORY_CARE_PROVIDER_SITE_OTHER): Payer: Self-pay | Admitting: Internal Medicine

## 2023-01-25 ENCOUNTER — Ambulatory Visit (INDEPENDENT_AMBULATORY_CARE_PROVIDER_SITE_OTHER): Payer: 59 | Admitting: Internal Medicine

## 2023-01-25 VITALS — BP 140/84 | HR 72 | Temp 98.2°F | Ht 62.0 in | Wt 199.0 lb

## 2023-01-25 DIAGNOSIS — I10 Essential (primary) hypertension: Secondary | ICD-10-CM

## 2023-01-25 DIAGNOSIS — Z6836 Body mass index (BMI) 36.0-36.9, adult: Secondary | ICD-10-CM | POA: Diagnosis not present

## 2023-01-25 NOTE — Assessment & Plan Note (Signed)
Blood pressure is above goal.  Her goal is less than 120/80.  She is currently on hydrochlorothiazide, losartan.  She is also on some medications that may cause increase in blood pressure like bupropion as needed naproxen, birth control, trazodone and venlafaxine.  Losing 10% of body weight may improve condition.  I recommend that she monitor blood pressure at home and continue to work with primary care team for intensification of therapy.

## 2023-01-25 NOTE — Progress Notes (Signed)
Office: [phone]  /  Fax: [phone]   Initial Visit  Ashley Orr was seen in clinic today to evaluate for obesity. She is interested in losing weight to improve overall health and reduce the risk of weight related complications. She presents today to review program treatment options, initial physical assessment, and evaluation.   She had been on phentermine and topiramate prescribed by PCP but medication was discontinued because of recent DVT.  She was referred by: PCP  When asked what else they would like to accomplish? She states: Improve energy levels and physical activity, Improve existing medical conditions, and Improve quality of life  Weight history:  When asked how has your weight affected you? She states: Contributed to medical problems, Contributed to orthopedic problems or mobility issues, Having fatigue, and Having poor endurance  Some associated conditions: Other: Thromboembolic state and hypertension  Contributing factors: Family history, Disruption of circadian rhythm, Stress, Reduced physical activity, and Pregnancy  Weight promoting medications identified: Psychotropic medications  Current nutrition plan: None  Current level of physical activity: None  Current or previous pharmacotherapy: Phentermine and Topiramate  Response to medication: Lost weight initially but was unable to sustain weight loss   Past medical history includes:   Past Medical History:  Diagnosis Date   Anemia    Hypertension    Hypothyroidism (acquired) 12/17/2021   MDD (major depressive disorder)    Restless legs syndrome    Thyroid disease      Objective:   BP (!) 140/84   Pulse 72   Temp 98.2 F (36.8 C)   Ht '5\' 2"'$  (1.575 m)   Wt 199 lb (90.3 kg)   LMP 12/26/2022   SpO2 99%   BMI 36.40 kg/m  She was weighed on the bioimpedance scale: Body mass index is 36.4 kg/m.  Peak Weight:220 , Body Fat%:42, Visceral Fat Rating:12, Weight trend over the last 12  months: Unchanged  General:  Alert, oriented and cooperative. Patient is in no acute distress.  Respiratory: Normal respiratory effort, no problems with respiration noted   Gait: able to ambulate independently  Mental Status: Normal mood and affect. Normal behavior. Normal judgment and thought content.   DIAGNOSTIC DATA REVIEWED:  BMET    Component Value Date/Time   NA 142 01/19/2023 1429   K 3.7 01/19/2023 1429   CL 102 01/19/2023 1429   CO2 22 01/19/2023 1429   GLUCOSE 81 01/19/2023 1429   GLUCOSE 137 (H) 09/01/2022 1129   BUN 9 01/19/2023 1429   CREATININE 1.00 01/19/2023 1429   CREATININE 1.40 (H) 09/01/2022 1129   CALCIUM 9.3 01/19/2023 1429   GFRNONAA 45 (L) 09/01/2022 1129   Lab Results  Component Value Date   HGBA1C 5.3 01/19/2023   No results found for: "INSULIN" CBC    Component Value Date/Time   WBC 7.7 12/01/2022 1023   WBC 9.4 09/01/2022 1129   WBC 6.8 06/06/2022 0505   RBC 4.34 12/01/2022 1023   RBC 4.54 09/01/2022 1129   HGB 12.2 12/01/2022 1023   HCT 37.9 12/01/2022 1023   PLT 237 12/01/2022 1023   MCV 87 12/01/2022 1023   MCH 28.1 12/01/2022 1023   MCH 29.1 09/01/2022 1129   MCHC 32.2 12/01/2022 1023   MCHC 33.0 09/01/2022 1129   RDW 14.6 12/01/2022 1023   Iron/TIBC/Ferritin/ %Sat    Component Value Date/Time   IRON 77 09/01/2022 1129   TIBC 501 (H) 09/01/2022 1129   FERRITIN 19 09/01/2022 1129   IRONPCTSAT 15 09/01/2022  1129   Lipid Panel     Component Value Date/Time   CHOL 181 01/19/2023 1429   TRIG 132 01/19/2023 1429   HDL 56 01/19/2023 1429   CHOLHDL 3.2 01/19/2023 1429   LDLCALC 102 (H) 01/19/2023 1429   Hepatic Function Panel     Component Value Date/Time   PROT 7.5 09/01/2022 1129   PROT 6.7 12/09/2021 1043   ALBUMIN 4.4 09/01/2022 1129   ALBUMIN 4.0 12/09/2021 1043   AST 24 09/01/2022 1129   ALT 15 09/01/2022 1129   ALKPHOS 61 09/01/2022 1129   BILITOT 0.4 09/01/2022 1129      Component Value Date/Time   TSH  87.800 (H) 08/28/2022 1040     Assessment and Plan:   Primary hypertension Assessment & Plan: Blood pressure is above goal.  Her goal is less than 120/80.  She is currently on hydrochlorothiazide, losartan.  She is also on some medications that may cause increase in blood pressure like bupropion as needed naproxen, birth control, trazodone and venlafaxine.  Losing 10% of body weight may improve condition.  I recommend that she monitor blood pressure at home and continue to work with primary care team for intensification of therapy.   Class 2 severe obesity with serious comorbidity and body mass index (BMI) of 36.0 to 36.9 in adult, unspecified obesity type [facility]) Assessment & Plan: We reviewed weight, biometrics, associated medical conditions and contributing factors with patient. She would benefit from weight loss therapy via a modified calorie, low-carb, high-protein nutritional plan tailored to their REE (resting energy expenditure) which will be determined by indirect calorimetry.  We will also assess for cardiometabolic risk and nutritional derangements via fasting serologies at her next appointment.         Obesity Treatment / Action Plan:  Patient will work on garnering support from family and friends to begin weight loss journey. Will work on eliminating or reducing the presence of highly palatable, calorie dense foods in the home. Will complete provided nutritional and psychosocial assessment questionnaire before the next appointment. Will be scheduled for indirect calorimetry to determine resting energy expenditure in a fasting state.  This will allow [name] to create a reduced calorie, high-protein meal plan to promote loss of fat mass while preserving muscle mass. Counseled on the health benefits of losing 5%-15% of total body weight. Was counseled on nutritional approaches to weight loss and benefits of complex carbs and high quality protein as part of nutritional weight  management. Was counseled on pharmacotherapy and role as an adjunct in weight management.   Obesity Education Performed Today:  She was weighed on the bioimpedance scale and results were discussed and documented in the synopsis.  We discussed obesity as a disease and the importance of a more detailed evaluation of all the factors contributing to the disease.  We discussed the importance of long term lifestyle changes which include nutrition, exercise and behavioral modifications as well as the importance of customizing this to her specific health and social needs.  We discussed the benefits of reaching a healthier weight to alleviate the symptoms of existing conditions and reduce the risks of the biomechanical, metabolic and psychological effects of obesity.  Ashley Orr appears to be in the action stage of change and states they are ready to start intensive lifestyle modifications and behavioral modifications.  30 minutes was spent today on this visit including the above counseling, pre-visit chart review, and post-visit documentation.  Reviewed by clinician on day of visit: allergies, medications,  problem list, medical history, surgical history, family history, social history, and previous encounter notes pertinent to obesity diagnosis.    Loyal Gambler, D.O. DABFM, DABOM [facility] [phone] [place], [place] [address] [phone]

## 2023-01-25 NOTE — Assessment & Plan Note (Signed)
We reviewed weight, biometrics, associated medical conditions and contributing factors with patient. She would benefit from weight loss therapy via a modified calorie, low-carb, high-protein nutritional plan tailored to their REE (resting energy expenditure) which will be determined by indirect calorimetry.  We will also assess for cardiometabolic risk and nutritional derangements via fasting serologies at her next appointment.

## 2023-01-29 ENCOUNTER — Other Ambulatory Visit (HOSPITAL_COMMUNITY): Payer: Self-pay

## 2023-01-30 ENCOUNTER — Other Ambulatory Visit (HOSPITAL_COMMUNITY): Payer: Self-pay | Admitting: Interventional Radiology

## 2023-01-30 DIAGNOSIS — D259 Leiomyoma of uterus, unspecified: Secondary | ICD-10-CM

## 2023-02-01 ENCOUNTER — Other Ambulatory Visit: Payer: Self-pay | Admitting: Family

## 2023-02-01 DIAGNOSIS — F32A Depression, unspecified: Secondary | ICD-10-CM

## 2023-02-02 ENCOUNTER — Other Ambulatory Visit: Payer: Self-pay | Admitting: Family

## 2023-02-02 DIAGNOSIS — F32A Depression, unspecified: Secondary | ICD-10-CM

## 2023-02-02 NOTE — Telephone Encounter (Signed)
Requested medication (s) are due for refill today: signed 01/19/23  Requested medication (s) are on the active medication list: yes   Last refill:  01/19/23- 04/19/23  #30 2 refills  Future visit scheduled: no   Notes to clinic:  Pharmacy comment: REQUEST FOR 90 DAYS PRESCRIPTION.       Requested Prescriptions  Pending Prescriptions Disp Refills   buPROPion (WELLBUTRIN SR) 150 MG 12 hr tablet [Pharmacy Med Name: BUPROPION HCL SR 150 MG TABLET] 90 tablet 1    Sig: TAKE 1 TABLET BY MOUTH EVERY DAY     Psychiatry: Antidepressants - bupropion Failed - 02/01/2023  5:52 PM      Failed - Last BP in normal range    BP Readings from Last 1 Encounters:  01/25/23 (!) 140/84         Passed - Cr in normal range and within 360 days    Creatinine  Date Value Ref Range Status  09/01/2022 1.40 (H) 0.44 - 1.00 mg/dL Final   Creatinine, Ser  Date Value Ref Range Status  01/19/2023 1.00 0.57 - 1.00 mg/dL Final         Passed - AST in normal range and within 360 days    AST  Date Value Ref Range Status  09/01/2022 24 15 - 41 U/L Final         Passed - ALT in normal range and within 360 days    ALT  Date Value Ref Range Status  09/01/2022 15 0 - 44 U/L Final         Passed - Completed PHQ-2 or PHQ-9 in the last 360 days      Passed - Valid encounter within last 6 months    Recent Outpatient Visits           2 weeks ago Primary hypertension   [facility], [place], NP   3 months ago Primary hypertension   [facility], Amy J, NP   5 months ago Encounter for weight management   [facility], Amy J, NP   9 months ago Encounter for weight management   [facility], Amy J, NP   10 months ago Essential (primary) hypertension   [facility], Flonnie Hailstone, NP

## 2023-03-06 NOTE — H&P (Signed)
Chief Complaint: Patient was seen in consultation today for uterine fibroids, dysfunctional bleeding  Referring Physician(s): Tinnie Gens, MD  Supervising Physician: Roanna Banning  Patient Status: Ashley Orr - Out-pt  History of Present Illness: Ashley Orr is a 52 y.o. 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 Ashley Orr was seen in consultation by Dr. Milford Cage on 01/11/23 - please see this consult note for full details. Briefly, Ashley Orr has a long history of uterine fibroids with heavy menstrual and peri-menstrual bleeding with non-stop bleeding since 09/2022. She previously underwent endometrial ablation and D&C for bleeding which were unsuccessful. She is currently on hormone therapy with persistent bleeding. She reported symptoms as fatigue, cramping, spotting, heavy menstrual bleeding lasting >7 days, frequent urinary urgency. She has 3 adult children and does not desire future pregnancies.   She underwent MRI pelvis with contrast on 01/24/23 as part of pre-procedure work up which showed:  Multiple small uterine fibroids, largest measuring 4.4 cm. No intracavitary fibroids identified.   3.8 cm pedunculated fibroid arising from the left posterior fundus. This has a thick soft tissue pedicle of attachment the uterus measuring 3.4 cm.   Benign-appearing bilateral ovarian cysts, measuring 2.9 cm on the right, and 3.3 cm on the left. No follow up imaging recommended. Note: This recommendation does not apply to premenarchal patients and to those with increased risk (genetic, family history, elevated tumor markers or other high-risk factors) of ovarian cancer. Reference: JACR 2020 Feb; 17(2):248-254  After thorough discussion with Dr. Milford Cage she is agreeable to proceed with uterine artery embolization for which she  presents today.  Past Medical History:  Diagnosis Date   Anemia    Hypertension    Hypothyroidism (acquired) 12/17/2021   MDD (major depressive disorder)    Restless legs syndrome    Thyroid disease     Past Surgical History:  Procedure Laterality Date   APPENDECTOMY     CESAREAN SECTION     DILATATION & CURETTAGE/HYSTEROSCOPY WITH MYOSURE N/A 05/30/2022   Procedure: ATTEMPTED DILATATION & CURETTAGE/HYSTEROSCOPY WITH MYOSURE;  Surgeon: Reva Bores, MD;  Location: [facility] OR;  Service: Gynecology;  Laterality: N/A;   DILITATION & CURRETTAGE/HYSTROSCOPY WITH HYDROTHERMAL ABLATION N/A 05/30/2022   Procedure: DILATATION & CURETTAGE/HYSTEROSCOPY WITH ATTEMPTED HYDROTHERMAL ABLATION;  Surgeon: Reva Bores, MD;  Location: [facility] OR;  Service: Gynecology;  Laterality: N/A;   IR RADIOLOGIST EVAL & MGMT  01/11/2023   TUBAL LIGATION      Allergies: Codeine  Medications: Prior to Admission medications   Medication Sig Start Date End Date Taking? Authorizing Provider  Ascorbic Acid (VITAMIN C PO) Take 60 mg by mouth daily. Patient not taking: Reported on 01/25/2023    [provider]  buPROPion [facility] SR) 150 MG 12 hr tablet TAKE 1 TABLET BY MOUTH EVERY DAY 02/02/23   Rema Fendt, NP  CASCARA SAGRADA PO Take 1 tablet by mouth daily as needed (Constipation).    [provider]  Cholecalciferol 50 MCG (2000 UT) CAPS Take 2,000 Units by mouth daily.    [provider]  citalopram (CELEXA) 40 MG tablet Take 1 tablet (40 mg total) by mouth daily. 12/28/22   Rema Fendt, NP  Cranberry 500 MG CAPS Take 2 capsules by mouth daily. Probiotic-Prebiotic With vitamin C 60 mg    [provider]  Cyanocobalamin (VITAMIN B-12) 5000 MCG SUBL Place  5,000 mcg under the tongue daily.    [provider]  ferrous sulfate 325 (65 FE) MG tablet Take 325 mg by mouth daily with breakfast.    [provider]  hydrochlorothiazide (HYDRODIURIL) 25 MG tablet Take  1 tablet (25 mg total) by mouth daily. 01/19/23 04/19/23  Rema Fendt, NP  levothyroxine (SYNTHROID) 300 MCG tablet TAKE 1 TABLET (300 MCG TOTAL) BY MOUTH DAILY BEFORE BREAKFAST. 11/15/22 02/13/23  Rema Fendt, NP  losartan (COZAAR) 50 MG tablet Take 1 tablet (50 mg total) by mouth daily. 12/28/22   Rema Fendt, NP  methocarbamol (ROBAXIN) 750 MG tablet Take 1 tablet (750 mg total) by mouth 4 (four) times daily. Patient not taking: Reported on 01/25/2023 06/16/22   Reva Bores, MD  naproxen (NAPROSYN) 500 MG tablet Take 1 tablet (500 mg total) by mouth 2 (two) times daily with a meal. Take with food 12/29/22   Rema Fendt, NP  norethindrone (AYGESTIN) 5 MG tablet Take 10 mg by mouth daily. 07/10/22   [provider]  OVER THE COUNTER MEDICATION Take 1 Scoop by mouth daily. Total Beets Juice 16 oz with water    [provider]  polyethylene glycol powder (GLYCOLAX/MIRALAX) 17 GM/SCOOP powder Take 17 g by mouth daily as needed for mild constipation. Patient not taking: Reported on 01/25/2023 06/06/22   Marguerita Merles Latif, DO  traZODone (DESYREL) 100 MG tablet Take 1 tablet (100 mg total) by mouth at bedtime. 02/02/23 05/03/23  Rema Fendt, NP  Venlafaxine HCl 37.5 MG TB24 Take 37.5 mg by mouth daily.    [provider]     Family History  Problem Relation Age of Onset   Stroke Mother    COPD Mother    Hypertension Mother    Heart disease Father    Diabetes Father    Hypertension Father    Hypertension Brother    Diabetes Brother     Social History   Socioeconomic History   Marital status: Single    Spouse name: Not on file   Number of children: Not on file   Years of education: Not on file   Highest education level: Not on file  Occupational History   Not on file  Tobacco Use   Smoking status: Every Day    Packs/day: .75    Types: Cigarettes    Passive exposure: Current   Smokeless tobacco: Never  Substance and Sexual Activity   Alcohol use:  Not Currently   Drug use: Not Currently   Sexual activity: Yes    Birth control/protection: Surgical  Other Topics Concern   Not on file  Social History Narrative   Not on file   Social Determinants of Orr   Financial Resource Strain: Not on file  Food Insecurity: Food Insecurity Present (05/22/2022)   Hunger Vital Sign    Worried About Running Out of Food in the Last Year: Sometimes true    Ran Out of Food in the Last Year: Sometimes true  Transportation Needs: No Transportation Needs (05/22/2022)   PRAPARE - Administrator, Civil Service (Medical): No    Lack of Transportation (Non-Medical): No  Physical Activity: Not on file  Stress: Not on file  Social Connections: Not on file     Review of Systems: A 12 point ROS discussed and pertinent positives are indicated in the HPI above.  All other systems are negative.  Review of Systems  Vital Signs: There were no  vitals taken for this visit.  Physical Exam       Imaging: No results found.  Labs:  CBC: Recent Labs    06/05/22 1646 06/06/22 0505 06/06/22 1308 09/01/22 1129 12/01/22 1023  WBC 9.6 6.8  --  9.4 7.7  HGB 8.6* 7.5* 8.1* 13.2 12.2  HCT 30.6* 27.0* 28.8* 40.0 37.9  PLT 272 231  --  256 237    COAGS: Recent Labs    06/06/22 0505  INR 1.2  APTT 36    BMP: Recent Labs    05/30/22 1342 06/05/22 1646 06/06/22 0505 09/01/22 1129 01/19/23 1429  NA 135 138 140 138 142  K 3.3* 2.7* 3.1* 3.5 3.7  CL 104 103 110 103 102  CO2 17* GLUCOSE 101* 115* 105* 137* 81  BUN 10 <5* 5* 11 9  CALCIUM 9.4 9.2 8.7* 9.7 9.3  CREATININE 1.15* 0.94 0.79 1.40* 1.00  GFRNONAA 57* >60 >60 45*  --     LIVER FUNCTION TESTS: Recent Labs    06/05/22 1646 06/06/22 0505 09/01/22 1129  BILITOT 0.2* 0.3 0.4  AST ALT ALKPHOS 57 47 61  PROT 7.2 5.8* 7.5  ALBUMIN 3.4* 2.7* 4.4    TUMOR MARKERS: No results for input(s): "AFPTM", "CEA", "CA199", "CHROMGRNA" in the  last 8760 hours.  Assessment and Plan:  52 y/o F with history of dysfunctional uterine bleeding and uterine fibroids with associated bulk symptoms who presents today for uterine artery embolization.   Risks and benefits of procedure were discussed with the patient including, but not limited to bleeding, infection, vascular injury or contrast induced renal failure.   This interventional procedure involves the use of X-rays and because of the nature of the planned procedure, it is possible that we will have prolonged use of X-ray fluoroscopy.   Potential radiation risks to you include (but are not limited to) the following: - A slightly elevated risk for cancer several years later in life. This risk is typically less than 0.5% percent. This risk is low in comparison to the normal incidence of human cancer, which is 33% for women and 50% for men according to the American Cancer Society.  - Radiation induced injury can include skin redness, resembling a rash, tissue breakdown / ulcers and hair loss (which can be temporary or permanent).    The likelihood of either of these occurring depends on the difficulty of the procedure and whether you are sensitive to radiation due to previous procedures, disease, or genetic conditions.    IF your procedure requires a prolonged use of radiation, you will be notified and given written instructions for further action.  It is your responsibility to monitor the irradiated area for the 2 weeks following the procedure and to notify your physician if you are concerned that you have suffered a radiation induced injury.     All of the patient's questions were answered, patient is agreeable to proceed.   Consent signed and in chart.   Thank you for this interesting consult.  I greatly enjoyed meeting Kashara Blocher and look forward to participating in their care.  A copy of this report was sent to the requesting provider on this date.  Electronically  Signed: Villa Herb, PA-C 03/06/2023, 9:29 AM   I spent a total of  40 Minutes  in face to face in clinical consultation, greater than 50% of which was counseling/coordinating care for dysfunctional uterine  bleeding, uterine fibroids.

## 2023-03-07 ENCOUNTER — Other Ambulatory Visit: Payer: Self-pay | Admitting: Student

## 2023-03-07 DIAGNOSIS — D251 Intramural leiomyoma of uterus: Secondary | ICD-10-CM

## 2023-03-07 MED ORDER — KETOROLAC TROMETHAMINE 30 MG/ML IJ SOLN: 30.0000 mg | Freq: Once | INTRAMUSCULAR | Status: DC

## 2023-03-08 ENCOUNTER — Other Ambulatory Visit (HOSPITAL_COMMUNITY): Payer: Self-pay | Admitting: Interventional Radiology

## 2023-03-08 ENCOUNTER — Ambulatory Visit (HOSPITAL_COMMUNITY)
Admission: RE | Admit: 2023-03-08 | Discharge: 2023-03-08 | Disposition: A | Discharge status: Home / Self Care | Payer: 59 | Source: Ambulatory Visit | Attending: Interventional Radiology | Admitting: Interventional Radiology

## 2023-03-08 ENCOUNTER — Encounter (HOSPITAL_COMMUNITY): Payer: Self-pay

## 2023-03-08 ENCOUNTER — Other Ambulatory Visit (HOSPITAL_COMMUNITY): Payer: Self-pay

## 2023-03-08 VITALS — BP 163/100 | HR 92 | Temp 98.3°F | Resp 16 | Ht 62.0 in | Wt 199.0 lb

## 2023-03-08 DIAGNOSIS — D649 Anemia, unspecified: Secondary | ICD-10-CM | POA: Insufficient documentation

## 2023-03-08 DIAGNOSIS — F329 Major depressive disorder, single episode, unspecified: Secondary | ICD-10-CM | POA: Diagnosis not present

## 2023-03-08 DIAGNOSIS — D259 Leiomyoma of uterus, unspecified: Secondary | ICD-10-CM

## 2023-03-08 DIAGNOSIS — N939 Abnormal uterine and vaginal bleeding, unspecified: Secondary | ICD-10-CM | POA: Diagnosis not present

## 2023-03-08 DIAGNOSIS — I10 Essential (primary) hypertension: Secondary | ICD-10-CM | POA: Insufficient documentation

## 2023-03-08 DIAGNOSIS — D251 Intramural leiomyoma of uterus: Secondary | ICD-10-CM

## 2023-03-08 DIAGNOSIS — G2581 Restless legs syndrome: Secondary | ICD-10-CM | POA: Diagnosis not present

## 2023-03-08 DIAGNOSIS — N938 Other specified abnormal uterine and vaginal bleeding: Secondary | ICD-10-CM | POA: Diagnosis not present

## 2023-03-08 DIAGNOSIS — F17210 Nicotine dependence, cigarettes, uncomplicated: Secondary | ICD-10-CM | POA: Insufficient documentation

## 2023-03-08 HISTORY — PX: IR ANGIOGRAM SELECTIVE EACH ADDITIONAL VESSEL: IMG667

## 2023-03-08 HISTORY — PX: IR ANGIOGRAM PELVIS SELECTIVE OR SUPRASELECTIVE: IMG661

## 2023-03-08 HISTORY — PX: IR 3D INDEPENDENT WKST: IMG2385

## 2023-03-08 HISTORY — PX: IR US GUIDE VASC ACCESS LEFT: IMG2389

## 2023-03-08 HISTORY — PX: IR EMBO TUMOR ORGAN ISCHEMIA INFARCT INC GUIDE ROADMAPPING: IMG5449

## 2023-03-08 LAB — HCG, SERUM, QUALITATIVE: Preg, Serum: NEGATIVE

## 2023-03-08 LAB — CBC
HCT: 39.9 % (ref 36.0–46.0)
Hemoglobin: 12.8 g/dL (ref 12.0–15.0)
MCH: 28.9 pg (ref 26.0–34.0)
MCHC: 32.1 g/dL (ref 30.0–36.0)
MCV: 90.1 fL (ref 80.0–100.0)
Platelets: 258 10*3/uL (ref 150–400)
RBC: 4.43 MIL/uL (ref 3.87–5.11)
RDW: 14.6 % (ref 11.5–15.5)
WBC: 6.8 10*3/uL (ref 4.0–10.5)
nRBC: 0 % (ref 0.0–0.2)

## 2023-03-08 LAB — BASIC METABOLIC PANEL
Anion gap: 7 (ref 5–15)
BUN: 9 mg/dL (ref 6–20)
CO2: 24 mmol/L (ref 22–32)
Calcium: 9.2 mg/dL (ref 8.9–10.3)
Chloride: 108 mmol/L (ref 98–111)
Creatinine, Ser: 0.89 mg/dL (ref 0.44–1.00)
GFR, Estimated: >60 mL/min (ref >60)
Glucose, Bld: 80 mg/dL (ref 70–99)
Potassium: 3.7 mmol/L (ref 3.5–5.1)
Sodium: 139 mmol/L (ref 135–145)

## 2023-03-08 LAB — PROTIME-INR
INR: 1 (ref 0.8–1.2)
Prothrombin Time: 12.7 seconds (ref 11.4–15.2)

## 2023-03-08 MED ORDER — MIDAZOLAM HCL 2 MG/2ML IJ SOLN
INTRAMUSCULAR | Status: AC
  Filled 2023-03-08: qty 2

## 2023-03-08 MED ORDER — KETOROLAC TROMETHAMINE 30 MG/ML IJ SOLN
INTRAMUSCULAR | Status: AC
  Filled 2023-03-08: qty 1

## 2023-03-08 MED ORDER — OXYCODONE HCL 5 MG PO TABS
10.0000 mg | ORAL_TABLET | ORAL | Status: DC | PRN
  Administered 2023-03-08: 10 mg via ORAL

## 2023-03-08 MED ORDER — DIPHENHYDRAMINE HCL 50 MG/ML IJ SOLN
INTRAMUSCULAR | Status: AC
  Filled 2023-03-08: qty 1

## 2023-03-08 MED ORDER — HYDROMORPHONE HCL 1 MG/ML IJ SOLN: 0.5000 mg | INTRAMUSCULAR | Status: DC | PRN

## 2023-03-08 MED ORDER — DOCUSATE SODIUM 100 MG PO CAPS
100.0000 mg | ORAL_CAPSULE | Freq: Every day | ORAL | 0 refills | Status: AC
  Filled 2023-03-08: qty 100, 100d supply, fill #0

## 2023-03-08 MED ORDER — MIDAZOLAM HCL 2 MG/2ML IJ SOLN
INTRAMUSCULAR | Status: AC | PRN
  Administered 2023-03-08: .5 mg via INTRAVENOUS
  Administered 2023-03-08: 1 mg via INTRAVENOUS
  Administered 2023-03-08 (×3): .5 mg via INTRAVENOUS
  Administered 2023-03-08: 1 mg via INTRAVENOUS

## 2023-03-08 MED ORDER — DEXAMETHASONE SODIUM PHOSPHATE 10 MG/ML IJ SOLN
10.0000 mg | Freq: Once | INTRAMUSCULAR | Status: AC
  Administered 2023-03-08: 10 mg via INTRAVENOUS

## 2023-03-08 MED ORDER — SODIUM CHLORIDE 0.9% FLUSH: 3.0000 mL | INTRAVENOUS | Status: DC | PRN

## 2023-03-08 MED ORDER — NAPROXEN 500 MG PO TABS
500.0000 mg | ORAL_TABLET | Freq: Two times a day (BID) | ORAL | 0 refills | Status: AC
  Filled 2023-03-08: qty 14, 7d supply, fill #0

## 2023-03-08 MED ORDER — SODIUM CHLORIDE 0.9 % IV SOLN: INTRAVENOUS | Status: DC

## 2023-03-08 MED ORDER — SODIUM CHLORIDE 0.9 % IV SOLN: INTRAVENOUS | Status: AC

## 2023-03-08 MED ORDER — FENTANYL CITRATE (PF) 100 MCG/2ML IJ SOLN
INTRAMUSCULAR | Status: AC
  Filled 2023-03-08: qty 2

## 2023-03-08 MED ORDER — LIDOCAINE HCL (PF) 1 % IJ SOLN
INTRAMUSCULAR | Status: AC
  Filled 2023-03-08: qty 30

## 2023-03-08 MED ORDER — SODIUM CHLORIDE 0.9 % IV SOLN: 250.0000 mL | INTRAVENOUS | Status: DC | PRN

## 2023-03-08 MED ORDER — DIPHENHYDRAMINE HCL 50 MG/ML IJ SOLN
INTRAMUSCULAR | Status: AC | PRN
  Administered 2023-03-08: 50 mg via INTRAVENOUS

## 2023-03-08 MED ORDER — OXYCODONE HCL 5 MG PO TABS
ORAL_TABLET | ORAL | Status: AC
  Filled 2023-03-08: qty 2

## 2023-03-08 MED ORDER — GELATIN ABSORBABLE 12-7 MM EX MISC
CUTANEOUS | Status: AC
  Filled 2023-03-08: qty 1

## 2023-03-08 MED ORDER — ONDANSETRON HCL 4 MG/2ML IJ SOLN
4.0000 mg | Freq: Once | INTRAMUSCULAR | Status: AC
  Administered 2023-03-08: 4 mg via INTRAVENOUS
  Filled 2023-03-08: qty 2

## 2023-03-08 MED ORDER — HEPARIN SODIUM (PORCINE) 1000 UNIT/ML IJ SOLN
INTRAMUSCULAR | Status: AC
  Filled 2023-03-08: qty 10

## 2023-03-08 MED ORDER — HYDRALAZINE HCL 20 MG/ML IJ SOLN
INTRAMUSCULAR | Status: AC | PRN
  Administered 2023-03-08 (×2): 10 mg via INTRAVENOUS

## 2023-03-08 MED ORDER — SODIUM CHLORIDE 0.9 % IV SOLN: 12.5000 mg | Freq: Four times a day (QID) | INTRAVENOUS | Status: DC | PRN

## 2023-03-08 MED ORDER — LIDOCAINE HCL (PF) 2 % IJ SOLN
INTRAMUSCULAR | Status: AC
  Filled 2023-03-08: qty 10

## 2023-03-08 MED ORDER — CEFAZOLIN SODIUM-DEXTROSE 2-4 GM/100ML-% IV SOLN
2.0000 g | INTRAVENOUS | Status: AC
  Administered 2023-03-08: 2 g via INTRAVENOUS
  Filled 2023-03-08: qty 100

## 2023-03-08 MED ORDER — OXYCODONE-ACETAMINOPHEN 5-325 MG PO TABS
1.0000 | ORAL_TABLET | ORAL | 0 refills | Status: DC | PRN
  Filled 2023-03-08: qty 30, 5d supply, fill #0

## 2023-03-08 MED ORDER — IOHEXOL 300 MG/ML  SOLN
100.0000 mL | Freq: Once | INTRAMUSCULAR | Status: AC | PRN
  Administered 2023-03-08: 20 mL via INTRA_ARTERIAL

## 2023-03-08 MED ORDER — HYDROMORPHONE HCL 2 MG/ML IJ SOLN
INTRAMUSCULAR | Status: AC
  Filled 2023-03-08: qty 1

## 2023-03-08 MED ORDER — VERAPAMIL HCL 2.5 MG/ML IV SOLN
INTRAVENOUS | Status: AC
  Filled 2023-03-08: qty 2

## 2023-03-08 MED ORDER — GELATIN ABSORBABLE 12-7 MM EX MISC
1.0000 | Freq: Once | CUTANEOUS | Status: AC
  Administered 2023-03-08: 1

## 2023-03-08 MED ORDER — HYDROMORPHONE HCL 1 MG/ML IJ SOLN
INTRAMUSCULAR | Status: AC | PRN
  Administered 2023-03-08 (×2): 1 mg via INTRAVENOUS

## 2023-03-08 MED ORDER — NITROGLYCERIN IN D5W 100-5 MCG/ML-% IV SOLN
INTRAVENOUS | Status: AC
  Filled 2023-03-08: qty 250

## 2023-03-08 MED ORDER — LIDOCAINE-EPINEPHRINE (PF) 2 %-1:200000 IJ SOLN: 10.0000 mL | Freq: Once | INTRAMUSCULAR | Status: DC

## 2023-03-08 MED ORDER — ACETAMINOPHEN 10 MG/ML IV SOLN
1000.0000 mg | Freq: Once | INTRAVENOUS | Status: AC
  Administered 2023-03-08: 1000 mg via INTRAVENOUS
  Filled 2023-03-08: qty 100

## 2023-03-08 MED ORDER — KETOROLAC TROMETHAMINE 15 MG/ML IJ SOLN
INTRAMUSCULAR | Status: AC | PRN
  Administered 2023-03-08 (×2): 15 mg via INTRAVENOUS

## 2023-03-08 MED ORDER — IOHEXOL 300 MG/ML  SOLN
100.0000 mL | Freq: Once | INTRAMUSCULAR | Status: AC | PRN
  Administered 2023-03-08: 48 mL via INTRA_ARTERIAL

## 2023-03-08 MED ORDER — ONDANSETRON HCL 4 MG PO TABS
4.0000 mg | ORAL_TABLET | ORAL | 0 refills | Status: AC | PRN
  Filled 2023-03-08: qty 18, 21d supply, fill #0

## 2023-03-08 MED ORDER — HYDRALAZINE HCL 20 MG/ML IJ SOLN
INTRAMUSCULAR | Status: AC
  Filled 2023-03-08: qty 1

## 2023-03-08 MED ORDER — VERAPAMIL HCL 2.5 MG/ML IV SOLN: INTRA_ARTERIAL | Status: AC | PRN

## 2023-03-08 MED ORDER — FENTANYL CITRATE (PF) 100 MCG/2ML IJ SOLN
INTRAMUSCULAR | Status: AC | PRN
  Administered 2023-03-08 (×4): 25 ug via INTRAVENOUS
  Administered 2023-03-08 (×2): 50 ug via INTRAVENOUS

## 2023-03-08 MED ORDER — DEXAMETHASONE SODIUM PHOSPHATE 10 MG/ML IJ SOLN
INTRAMUSCULAR | Status: AC
  Filled 2023-03-08: qty 1

## 2023-03-08 MED ORDER — IOHEXOL 300 MG/ML  SOLN
100.0000 mL | Freq: Once | INTRAMUSCULAR | Status: AC | PRN
  Administered 2023-03-08: 50 mL via INTRA_ARTERIAL

## 2023-03-08 NOTE — Procedures (Signed)
Vascular and Interventional Radiology Procedure Note  Patient: Ashley Orr DOB: 02-15-1971 Medical Record Number: [id number] Note Date/Time: 03/08/23 10:06 AM   Performing Physician: Roanna Banning, MD Assistant(s): None  Diagnosis: Dysfunctional uterine bleeding   Procedure(s):  PELVIC ARTERIOGRAPHY BILATERAL UTERINE ARTERY EMBOLIZATION for FIBROIDS   Anesthesia: Conscious Sedation Complications: None Estimated Blood Loss: Minimal Specimens: None  Findings:  - access via the LEFT radial artery. - Bilateral enlarged and tortuous uterine arteries. - Successful embolization with Gelfoam, 300-500, 500-700 and 700-900um microparticles to stasis. - TR band closure at L wrist at the end of the case  Plan: - Post sheath removal precautions.  - Standard post radial access deflation protocol.   Final report to follow once all images are reviewed and compared with previous studies.  See detailed dictation with images in PACS. The patient tolerated the procedure well without incident or complication and was returned to Recovery in stable condition.    Roanna Banning, MD [facility] Radiology   Pager. [phone] Clinic. [phone]

## 2023-03-08 NOTE — Sedation Documentation (Signed)
[place] left radial B

## 2023-03-09 ENCOUNTER — Telehealth (HOSPITAL_COMMUNITY): Payer: Self-pay | Admitting: Interventional Radiology

## 2023-03-09 NOTE — Progress Notes (Signed)
Vascular and Interventional Radiology  Phone Note  Patient: Ashley Orr DOB: [birth year] Medical Record Number: [id number] Note Date/Time: 03/09/23 12:00 PM   Diagnosis: Uterine fibroids. Dysfunctional uterine bleeding  I identified myself to the patient and conveyed my credentials to Ms.Christoper Allegra For medical emergencies, Pt was advised to call 911 or go to the nearest emergency room.   Assessment  Plan: 52 y.o. year old female POD 1 s/p uterine artery embolization for fibroids. VIR reached out in courtesy follow-up.  Pt reports that they are doing well. Pain is well controlled with Rxs.  Pt taking OTC stool softener as recommended.  No concern at this time.    Follow up Pt to follow up with me in Clinic within 1 month post op.   As part of this Telephone encounter, no in-person exam was conducted.  The patient was physically located in [place] or a state in which I am permitted to provide care. The encounter was reasonable and appropriate under the circumstances given the patient's presentation at the time.    Roanna Banning, MD Vascular and Interventional Radiology Specialists [facility] Radiology   Pager. [phone] Clinic. [phone]

## 2023-03-27 ENCOUNTER — Other Ambulatory Visit: Payer: Self-pay | Admitting: Family Medicine

## 2023-03-27 DIAGNOSIS — N938 Other specified abnormal uterine and vaginal bleeding: Secondary | ICD-10-CM

## 2023-04-06 ENCOUNTER — Ambulatory Visit
Admission: RE | Admit: 2023-04-06 | Discharge: 2023-04-06 | Disposition: A | Discharge status: Home / Self Care | Payer: 59 | Source: Ambulatory Visit | Attending: Physician Assistant | Admitting: Physician Assistant

## 2023-04-06 DIAGNOSIS — D251 Intramural leiomyoma of uterus: Secondary | ICD-10-CM

## 2023-04-06 DIAGNOSIS — D259 Leiomyoma of uterus, unspecified: Secondary | ICD-10-CM | POA: Diagnosis not present

## 2023-04-06 HISTORY — PX: IR RADIOLOGIST EVAL & MGMT: IMG5224

## 2023-04-06 NOTE — Progress Notes (Signed)
Referring Physician(s): [place] A  Supervising Physician: {Supervising Physician:[id number]}  Chief Complaint: The patient is seen in follow up today s/p ***  History of present illness:  ***  Past Medical History:  Diagnosis Date   Anemia    Hypertension    Hypothyroidism (acquired) 12/17/2021   MDD (major depressive disorder)    Restless legs syndrome    Thyroid disease     Past Surgical History:  Procedure Laterality Date   APPENDECTOMY     CESAREAN SECTION     DILATATION & CURETTAGE/HYSTEROSCOPY WITH MYOSURE N/A 05/30/2022   Procedure: ATTEMPTED DILATATION & CURETTAGE/HYSTEROSCOPY WITH MYOSURE;  Surgeon: Reva Bores, MD;  Location: [facility] OR;  Service: Gynecology;  Laterality: N/A;   DILITATION & CURRETTAGE/HYSTROSCOPY WITH HYDROTHERMAL ABLATION N/A 05/30/2022   Procedure: DILATATION & CURETTAGE/HYSTEROSCOPY WITH ATTEMPTED HYDROTHERMAL ABLATION;  Surgeon: Reva Bores, MD;  Location: [facility] OR;  Service: Gynecology;  Laterality: N/A;   IR 3D INDEPENDENT WKST  03/08/2023   IR ANGIOGRAM PELVIS SELECTIVE OR SUPRASELECTIVE  03/08/2023   IR ANGIOGRAM SELECTIVE EACH ADDITIONAL VESSEL  03/08/2023   IR ANGIOGRAM SELECTIVE EACH ADDITIONAL VESSEL  03/08/2023   IR ANGIOGRAM SELECTIVE EACH ADDITIONAL VESSEL  03/08/2023   IR EMBO TUMOR ORGAN ISCHEMIA INFARCT INC GUIDE ROADMAPPING  03/08/2023   IR RADIOLOGIST EVAL & MGMT  01/11/2023   IR RADIOLOGIST EVAL & MGMT  04/06/2023   IR US GUIDE VASC ACCESS LEFT  03/08/2023   TUBAL LIGATION      Allergies: Codeine  Medications: Prior to Admission medications   Medication Sig Start Date End Date Taking? Authorizing Provider  Ascorbic Acid (VITAMIN C PO) Take 60 mg by mouth daily. Patient not taking: Reported on 01/25/2023    [provider]  buPROPion [facility] SR) 150 MG 12 hr tablet TAKE 1 TABLET BY MOUTH EVERY DAY 02/02/23   Rema Fendt, NP  CASCARA SAGRADA PO Take 1 tablet by mouth daily as needed (Constipation).     [provider]  Cholecalciferol 50 MCG (2000 UT) CAPS Take 2,000 Units by mouth daily.    [provider]  citalopram (CELEXA) 40 MG tablet Take 1 tablet (40 mg total) by mouth daily. 12/28/22   Rema Fendt, NP  Cranberry 500 MG CAPS Take 2 capsules by mouth daily. Probiotic-Prebiotic With vitamin C 60 mg    [provider]  Cyanocobalamin (VITAMIN B-12) 5000 MCG SUBL Place 5,000 mcg under the tongue daily.    [provider]  ferrous sulfate 325 (65 FE) MG tablet Take 325 mg by mouth daily with breakfast.    [provider]  hydrochlorothiazide (HYDRODIURIL) 25 MG tablet Take 1 tablet (25 mg total) by mouth daily. 01/19/23 04/19/23  Rema Fendt, NP  levothyroxine (SYNTHROID) 300 MCG tablet TAKE 1 TABLET (300 MCG TOTAL) BY MOUTH DAILY BEFORE BREAKFAST. 11/15/22 03/08/23  Rema Fendt, NP  losartan (COZAAR) 50 MG tablet Take 1 tablet (50 mg total) by mouth daily. 12/28/22   Rema Fendt, NP  methocarbamol (ROBAXIN) 750 MG tablet Take 1 tablet (750 mg total) by mouth 4 (four) times daily. Patient not taking: Reported on 01/25/2023 06/16/22   Reva Bores, MD  naproxen (NAPROSYN) 500 MG tablet Take 1 tablet (500 mg total) by mouth 2 (two) times daily with a meal. Take with food 12/29/22   Rema Fendt, NP  norethindrone (AYGESTIN) 5 MG tablet Take 10 mg by mouth daily. 07/10/22   [provider]  ondansetron [facility])  4 MG tablet Take 1 tablet (4 mg total) by mouth every 4 (four) hours as needed for nausea or vomiting. 03/08/23   Lynnette Caffey A, PA-C  OVER THE COUNTER MEDICATION Take 1 Scoop by mouth daily. Total Beets Juice 16 oz with water    [provider]  oxyCODONE-acetaminophen (PERCOCET) 5-325 MG tablet Take 1 tablet by mouth every 4 (four) hours as needed for severe pain. 03/08/23   Lynnette Caffey A, PA-C  polyethylene glycol powder (GLYCOLAX/MIRALAX) 17 GM/SCOOP powder Take 17 g by mouth daily as needed for mild  constipation. Patient not taking: Reported on 01/25/2023 06/06/22   Marguerita Merles Latif, DO  traZODone (DESYREL) 100 MG tablet Take 1 tablet (100 mg total) by mouth at bedtime. 02/02/23 05/03/23  Rema Fendt, NP  Venlafaxine HCl 37.5 MG TB24 Take 37.5 mg by mouth daily.    [provider]     Family History  Problem Relation Age of Onset   Stroke Mother    COPD Mother    Hypertension Mother    Heart disease Father    Diabetes Father    Hypertension Father    Hypertension Brother    Diabetes Brother     Social History   Socioeconomic History   Marital status: Single    Spouse name: Not on file   Number of children: Not on file   Years of education: Not on file   Highest education level: Not on file  Occupational History   Not on file  Tobacco Use   Smoking status: Every Day    Packs/day: .75    Types: Cigarettes    Passive exposure: Current   Smokeless tobacco: Never  Vaping Use   Vaping Use: Never used  Substance and Sexual Activity   Alcohol use: Not Currently   Drug use: Not Currently   Sexual activity: Yes    Birth control/protection: Surgical  Other Topics Concern   Not on file  Social History Narrative   Not on file   Social Determinants of Health   Financial Resource Strain: Not on file  Food Insecurity: Food Insecurity Present (05/22/2022)   Hunger Vital Sign    Worried About Running Out of Food in the Last Year: Sometimes true    Ran Out of Food in the Last Year: Sometimes true  Transportation Needs: No Transportation Needs (05/22/2022)   PRAPARE - Administrator, Civil Service (Medical): No    Lack of Transportation (Non-Medical): No  Physical Activity: Not on file  Stress: Not on file  Social Connections: Not on file     Vital Signs: LMP 09/20/2022 (Approximate) Comment: has had continuous period since November 2023  Physical Exam  Imaging:     MR Pelvis W/WO, 01/24/23 IMPRESSION:  Multiple small uterine fibroids,  largest measuring 4.4 cm. No intracavitary fibroids identified.   3.8 cm pedunculated fibroid arising from the left posterior fundus. This has a thick soft tissue pedicle of attachment the uterus measuring 3.4 cm.   Labs:  CBC: Recent Labs    06/06/22 0505 06/06/22 1308 09/01/22 1129 12/01/22 1023 03/08/23 0808  WBC 6.8  --  9.4 7.7 6.8  HGB 7.5* 8.1* 13.2 12.2 12.8  HCT 27.0* 28.8* 40.0 37.9 39.9  PLT 231  --  256 237 258    COAGS: Recent Labs    06/06/22 0505 03/08/23 0808  INR 1.2 1.0  APTT 36  --     BMP: Recent Labs    06/05/22  1646 06/06/22 0505 09/01/22 1129 01/19/23 1429 03/08/23 0808  NA 138 140 138 142 139  K 2.7* 3.1* 3.5 3.7 3.7  CL 103 110 103 102 108  CO2 23 23 25 22 24   GLUCOSE 115* 105* 137* 81 80  BUN <5* 5* 11 9 9   CALCIUM 9.2 8.7* 9.7 9.3 9.2  CREATININE 0.94 0.79 1.40* 1.00 0.89  GFRNONAA >60 >60 45*  --  >60    LIVER FUNCTION TESTS: Recent Labs    06/05/22 1646 06/06/22 0505 09/01/22 1129  BILITOT 0.2* 0.3 0.4  AST 20 17 24   ALT 21 18 15   ALKPHOS 57 47 61  PROT 7.2 5.8* 7.5  ALBUMIN 3.4* 2.7* 4.4    Assessment and Plan:  ***  Electronically Signed: Roanna Banning 04/06/2023, 10:09 AM   I spent a total of {Established Out-Pt:[id number]} in face to face in clinical consultation, greater than 50% of which was counseling/coordinating care for ***

## 2023-04-26 ENCOUNTER — Other Ambulatory Visit: Payer: Self-pay | Admitting: Family

## 2023-04-26 DIAGNOSIS — I10 Essential (primary) hypertension: Secondary | ICD-10-CM

## 2023-04-26 DIAGNOSIS — F32A Depression, unspecified: Secondary | ICD-10-CM

## 2023-04-26 MED ORDER — TRAZODONE HCL 100 MG PO TABS
100.0000 mg | ORAL_TABLET | Freq: Every day | ORAL | 0 refills | Status: DC
Start: 2023-04-26 — End: 2023-10-02

## 2023-04-26 MED ORDER — HYDROCHLOROTHIAZIDE 25 MG PO TABS
25.0000 mg | ORAL_TABLET | Freq: Every day | ORAL | 0 refills | Status: DC
Start: 2023-04-26 — End: 2023-10-02

## 2023-04-26 MED ORDER — BUPROPION HCL ER (SR) 150 MG PO TB12
150.0000 mg | ORAL_TABLET | Freq: Every day | ORAL | 0 refills | Status: DC
Start: 2023-04-26 — End: 2023-10-02

## 2023-04-26 MED ORDER — CITALOPRAM HYDROBROMIDE 40 MG PO TABS
40.0000 mg | ORAL_TABLET | Freq: Every day | ORAL | 0 refills | Status: DC
Start: 2023-04-26 — End: 2023-09-20

## 2023-04-26 MED ORDER — LOSARTAN POTASSIUM 50 MG PO TABS: 50.0000 mg | ORAL_TABLET | Freq: Every day | ORAL | 0 refills | Status: DC

## 2023-04-26 NOTE — Telephone Encounter (Signed)
Hydrochlorothiazide, Losartan, Citalopram, Bupropion, and Trazodone prescribed. Norethindrone managed by Gynecology. Please request refills from the same. Thank you.

## 2023-05-02 ENCOUNTER — Other Ambulatory Visit: Payer: Self-pay | Admitting: Family Medicine

## 2023-05-02 DIAGNOSIS — N938 Other specified abnormal uterine and vaginal bleeding: Secondary | ICD-10-CM

## 2023-05-07 ENCOUNTER — Telehealth: Payer: Self-pay

## 2023-05-07 ENCOUNTER — Other Ambulatory Visit: Payer: Self-pay

## 2023-05-07 NOTE — Progress Notes (Signed)
   Ashley Orr 20-Jan-1971 [id number]  Patient outreached by Mack Guise , PharmD Candidate on 05/07/2023.  Blood Pressure Readings: Last documented ambulatory systolic blood pressure: 163 Last documented ambulatory diastolic blood pressure: 100 Systolic BP today: 127 Diastolic BP today: 69 Does the patient have a validated home blood pressure machine?: Yes They report home readings 120s/ 69-72  Medication review was performed. Is the patient taking their medications as prescribed?: Yes Differences from their prescribed list include: NA  The following barriers to adherence were noted: Does the patient have cost concerns?: No Does the patient have transportation concerns?: No Does the patient need assistance obtaining refills?: Yes Does the patient occassionally forget to take some of their prescribed medications?: No Does the patient feel like one/some of their medications make them feel poorly?: No Does the patient have questions or concerns about their medications?: No Does the patient have a follow up scheduled with their primary care provider/cardiologist?: Yes   Interventions: Interventions Completed: Medications were reviewed  The patient has follow up scheduled:  PCP: Rema Fendt, NP   Mack Guise, Student-PharmD

## 2023-05-07 NOTE — Progress Notes (Unsigned)
Patient ID: Ashley Orr, female    DOB: Mar 07, 1971  MRN: [id number]  CC: Chronic Care Management   Subjective: Ashley Orr is a 52 y.o. female who presents for chronic care management.   Her concerns today include:  HTN - Losartan, HCTZ Depression - Citalopram, Trazodone, Bupropion  Vascular referral - history of DVT Endo - hypothyroidism  Medical Weight Management  Oncology - pulmonary embolism Gyn - anemia, uterine leiomyoma, women's health   Patient Active Problem List   Diagnosis Date Noted   Class 2 severe obesity with serious comorbidity and body mass index (BMI) of 36.0 to 36.9 in adult [facility]) 01/25/2023   Intramural and subserous leiomyoma of uterus 12/01/2022   Acute pulmonary embolism without acute cor pulmonale (HCC) 06/06/2022   Major depressive disorder 06/06/2022   Acute deep vein thrombosis (DVT) of tibial vein of left lower extremity (HCC) 06/06/2022   Dysfunctional uterine bleeding 04/13/2022   Cellulitis of face 12/17/2021   Iron deficiency anemia 01/13/2021   Smoker 01/12/2021   MDD (major depressive disorder), recurrent episode, moderate (HCC) 01/12/2021   Anemia 01/12/2021   Insomnia 01/12/2021   Stress incontinence 01/12/2021   Health care maintenance 01/01/2020   Primary hypertension 12/29/2019   Acquired hypothyroidism 12/29/2019     Current Outpatient Medications on File Prior to Visit  Medication Sig Dispense Refill   Ascorbic Acid (VITAMIN C PO) Take 60 mg by mouth daily. (Patient not taking: Reported on 01/25/2023)     buPROPion (WELLBUTRIN SR) 150 MG 12 hr tablet Take 1 tablet (150 mg total) by mouth daily. 90 tablet 0   CASCARA SAGRADA PO Take 1 tablet by mouth daily as needed (Constipation).     Cholecalciferol 50 MCG (2000 UT) CAPS Take 2,000 Units by mouth daily.     citalopram (CELEXA) 40 MG tablet Take 1 tablet (40 mg total) by mouth daily. 90 tablet 0   Cranberry 500 MG CAPS Take 2 capsules by mouth daily.  Probiotic-Prebiotic With vitamin C 60 mg     Cyanocobalamin (VITAMIN B-12) 5000 MCG SUBL Place 5,000 mcg under the tongue daily.     ferrous sulfate 325 (65 FE) MG tablet Take 325 mg by mouth daily with breakfast.     hydrochlorothiazide (HYDRODIURIL) 25 MG tablet Take 1 tablet (25 mg total) by mouth daily. 90 tablet 0   levothyroxine (SYNTHROID) 300 MCG tablet TAKE 1 TABLET (300 MCG TOTAL) BY MOUTH DAILY BEFORE BREAKFAST. 30 tablet 2   losartan (COZAAR) 50 MG tablet Take 1 tablet (50 mg total) by mouth daily. 90 tablet 0   naproxen (NAPROSYN) 500 MG tablet Take 1 tablet (500 mg total) by mouth 2 (two) times daily with a meal. 60 tablet 0   norethindrone (AYGESTIN) 5 MG tablet TAKE 2 TABLETS BY MOUTH EVERY DAY 60 tablet 5   ondansetron (ZOFRAN) 4 MG tablet Take 1 tablet (4 mg total) by mouth every 4 (four) hours as needed for nausea or vomiting. 30 tablet 0   OVER THE COUNTER MEDICATION Take 1 Scoop by mouth daily. Total Beets Juice 16 oz with water     oxyCODONE-acetaminophen (PERCOCET) 5-325 MG tablet Take 1 tablet by mouth every 4 (four) hours as needed for severe pain. 30 tablet 0   polyethylene glycol powder (GLYCOLAX/MIRALAX) 17 GM/SCOOP powder Take 17 g by mouth daily as needed for mild constipation. (Patient not taking: Reported on 01/25/2023) 238 g 0   traZODone (DESYREL) 100 MG tablet Take 1 tablet (100 mg total)  by mouth at bedtime. 90 tablet 0   No current facility-administered medications on file prior to visit.    Allergies  Allergen Reactions   Codeine Anaphylaxis    Social History   Socioeconomic History   Marital status: Single    Spouse name: Not on file   Number of children: Not on file   Years of education: Not on file   Highest education level: Not on file  Occupational History   Not on file  Tobacco Use   Smoking status: Every Day    Packs/day: .75    Types: Cigarettes    Passive exposure: Current   Smokeless tobacco: Never  Vaping Use   Vaping Use: Never  used  Substance and Sexual Activity   Alcohol use: Not Currently   Drug use: Not Currently   Sexual activity: Yes    Birth control/protection: Surgical  Other Topics Concern   Not on file  Social History Narrative   Not on file   Social Determinants of Health   Financial Resource Strain: Not on file  Food Insecurity: Food Insecurity Present (05/22/2022)   Hunger Vital Sign    Worried About Running Out of Food in the Last Year: Sometimes true    Ran Out of Food in the Last Year: Sometimes true  Transportation Needs: No Transportation Needs (05/22/2022)   PRAPARE - Administrator, Civil Service (Medical): No    Lack of Transportation (Non-Medical): No  Physical Activity: Not on file  Stress: Not on file  Social Connections: Not on file  Intimate Partner Violence: Not on file    Family History  Problem Relation Age of Onset   Stroke Mother    COPD Mother    Hypertension Mother    Heart disease Father    Diabetes Father    Hypertension Father    Hypertension Brother    Diabetes Brother     Past Surgical History:  Procedure Laterality Date   APPENDECTOMY     CESAREAN SECTION     DILATATION & CURETTAGE/HYSTEROSCOPY WITH MYOSURE N/A 05/30/2022   Procedure: ATTEMPTED DILATATION & CURETTAGE/HYSTEROSCOPY WITH MYOSURE;  Surgeon: Reva Bores, MD;  Location: [facility] OR;  Service: Gynecology;  Laterality: N/A;   DILITATION & CURRETTAGE/HYSTROSCOPY WITH HYDROTHERMAL ABLATION N/A 05/30/2022   Procedure: DILATATION & CURETTAGE/HYSTEROSCOPY WITH ATTEMPTED HYDROTHERMAL ABLATION;  Surgeon: Reva Bores, MD;  Location: [facility] OR;  Service: Gynecology;  Laterality: N/A;   IR 3D INDEPENDENT WKST  03/08/2023   IR ANGIOGRAM PELVIS SELECTIVE OR SUPRASELECTIVE  03/08/2023   IR ANGIOGRAM SELECTIVE EACH ADDITIONAL VESSEL  03/08/2023   IR ANGIOGRAM SELECTIVE EACH ADDITIONAL VESSEL  03/08/2023   IR ANGIOGRAM SELECTIVE EACH ADDITIONAL VESSEL  03/08/2023   IR EMBO TUMOR ORGAN ISCHEMIA INFARCT INC  GUIDE ROADMAPPING  03/08/2023   IR RADIOLOGIST EVAL & MGMT  01/11/2023   IR RADIOLOGIST EVAL & MGMT  04/06/2023   IR US GUIDE VASC ACCESS LEFT  03/08/2023   TUBAL LIGATION      ROS: Review of Systems Negative except as stated above  PHYSICAL EXAM: There were no vitals taken for this visit.  Physical Exam  {female adult master:310786} {female adult master:310785}     Latest Ref Rng & Units 03/08/2023    8:08 AM 01/19/2023    2:29 PM 09/01/2022   11:29 AM  CMP  Glucose 70 - 99 mg/dL 80  81  161   BUN 6 - 20 mg/dL 9  9  11  Creatinine 0.44 - 1.00 mg/dL 1.61  0.96  0.45   Sodium 135 - 145 mmol/L 139  142  138   Potassium 3.5 - 5.1 mmol/L 3.7  3.7  3.5   Chloride 98 - 111 mmol/L 108  102  103   CO2 22 - 32 mmol/L 24  22  25    Calcium 8.9 - 10.3 mg/dL 9.2  9.3  9.7   Total Protein 6.5 - 8.1 g/dL   7.5   Total Bilirubin 0.3 - 1.2 mg/dL   0.4   Alkaline Phos 38 - 126 U/L   61   AST 15 - 41 U/L   24   ALT 0 - 44 U/L   15    Lipid Panel     Component Value Date/Time   CHOL 181 01/19/2023 1429   TRIG 132 01/19/2023 1429   HDL 56 01/19/2023 1429   CHOLHDL 3.2 01/19/2023 1429   LDLCALC 102 (H) 01/19/2023 1429    CBC    Component Value Date/Time   WBC 6.8 03/08/2023 0808   RBC 4.43 03/08/2023 0808   HGB 12.8 03/08/2023 0808   HGB 12.2 12/01/2022 1023   HCT 39.9 03/08/2023 0808   HCT 37.9 12/01/2022 1023   PLT 258 03/08/2023 0808   PLT 237 12/01/2022 1023   MCV 90.1 03/08/2023 0808   MCV 87 12/01/2022 1023   MCH 28.9 03/08/2023 0808   MCHC 32.1 03/08/2023 0808   RDW 14.6 03/08/2023 0808   RDW 14.6 12/01/2022 1023   LYMPHSABS 2.3 09/01/2022 1129   LYMPHSABS 1.6 12/09/2021 1043   MONOABS 0.6 09/01/2022 1129   EOSABS 0.1 09/01/2022 1129   EOSABS 0.1 12/09/2021 1043   BASOSABS 0.0 09/01/2022 1129   BASOSABS 0.0 12/09/2021 1043    ASSESSMENT AND PLAN:  There are no diagnoses linked to this encounter.   Patient was given the opportunity to ask questions.  Patient  verbalized understanding of the plan and was able to repeat key elements of the plan. Patient was given clear instructions to go to Emergency Department or return to medical center if symptoms don't improve, worsen, or new problems develop.The patient verbalized understanding.   No orders of the defined types were placed in this encounter.    Requested Prescriptions    No prescriptions requested or ordered in this encounter    No follow-ups on file.  Rema Fendt, NP

## 2023-05-08 ENCOUNTER — Encounter: Payer: 59 | Admitting: Family

## 2023-05-08 DIAGNOSIS — I10 Essential (primary) hypertension: Secondary | ICD-10-CM

## 2023-05-08 DIAGNOSIS — F32A Depression, unspecified: Secondary | ICD-10-CM

## 2023-05-31 ENCOUNTER — Encounter: Payer: Self-pay | Admitting: Family Medicine

## 2023-05-31 ENCOUNTER — Ambulatory Visit (INDEPENDENT_AMBULATORY_CARE_PROVIDER_SITE_OTHER): Payer: 59 | Admitting: Family Medicine

## 2023-05-31 VITALS — BP 138/88 | HR 120 | Ht 62.0 in | Wt 206.0 lb

## 2023-05-31 DIAGNOSIS — Z1211 Encounter for screening for malignant neoplasm of colon: Secondary | ICD-10-CM | POA: Diagnosis not present

## 2023-05-31 DIAGNOSIS — N938 Other specified abnormal uterine and vaginal bleeding: Secondary | ICD-10-CM

## 2023-05-31 DIAGNOSIS — D252 Subserosal leiomyoma of uterus: Secondary | ICD-10-CM | POA: Diagnosis not present

## 2023-05-31 DIAGNOSIS — D251 Intramural leiomyoma of uterus: Secondary | ICD-10-CM | POA: Diagnosis not present

## 2023-05-31 NOTE — Progress Notes (Signed)
   Subjective:    Patient ID: Ashley Orr is a 52 y.o. female presenting with Vaginal Bleeding (Procedure in April 2024 for fibroid, and has had normal cycles since)  on 05/31/2023  HPI: Patient is s/p UFE for fibroids. Has now in last 2 months begun having normal cycles.   Review of Systems  Constitutional:  Negative for chills and fever.  Respiratory:  Negative for shortness of breath.   Cardiovascular:  Negative for chest pain.  Gastrointestinal:  Negative for abdominal pain, nausea and vomiting.  Genitourinary:  Negative for dysuria.  Skin:  Negative for rash.      Objective:    BP 138/88   Pulse (!) 120   Ht 5\' 2"  (1.575 m)   Wt 206 lb (93.4 kg)   LMP 05/29/2023   BMI 37.68 kg/m  Physical Exam Exam conducted with a chaperone present.  Constitutional:      General: She is not in acute distress.    Appearance: She is well-developed.  HENT:     Head: Normocephalic and atraumatic.  Eyes:     General: No scleral icterus. Cardiovascular:     Rate and Rhythm: Normal rate.  Pulmonary:     Effort: Pulmonary effort is normal.  Abdominal:     Palpations: Abdomen is soft.  Musculoskeletal:     Cervical back: Neck supple.  Skin:    General: Skin is warm and dry.  Neurological:     Mental Status: She is alert and oriented to person, place, and time.         Assessment & Plan:   Problem List Items Addressed This Visit       Unprioritized   Dysfunctional uterine bleeding    No worsening bleeding after UFE. Continue to monitor.      Intramural and subserous leiomyoma of uterus    S/o UFE, doing well.      Other Visit Diagnoses     Screen for colon cancer    -  Primary   Relevant Orders   Ambulatory referral to Gastroenterology        Return if symptoms worsen or fail to improve.  Reva Bores, MD 05/31/2023 2:01 PM

## 2023-05-31 NOTE — Assessment & Plan Note (Signed)
S/o UFE, doing well.

## 2023-05-31 NOTE — Assessment & Plan Note (Signed)
No worsening bleeding after UFE. Continue to monitor.

## 2023-06-09 DIAGNOSIS — E039 Hypothyroidism, unspecified: Secondary | ICD-10-CM | POA: Diagnosis not present

## 2023-06-09 DIAGNOSIS — F419 Anxiety disorder, unspecified: Secondary | ICD-10-CM | POA: Diagnosis not present

## 2023-06-09 DIAGNOSIS — Z008 Encounter for other general examination: Secondary | ICD-10-CM | POA: Diagnosis not present

## 2023-06-09 DIAGNOSIS — Z86718 Personal history of other venous thrombosis and embolism: Secondary | ICD-10-CM | POA: Diagnosis not present

## 2023-06-09 DIAGNOSIS — Z8249 Family history of ischemic heart disease and other diseases of the circulatory system: Secondary | ICD-10-CM | POA: Diagnosis not present

## 2023-06-09 DIAGNOSIS — Z87892 Personal history of anaphylaxis: Secondary | ICD-10-CM | POA: Diagnosis not present

## 2023-06-09 DIAGNOSIS — Z833 Family history of diabetes mellitus: Secondary | ICD-10-CM | POA: Diagnosis not present

## 2023-06-09 DIAGNOSIS — G4700 Insomnia, unspecified: Secondary | ICD-10-CM | POA: Diagnosis not present

## 2023-06-09 DIAGNOSIS — F329 Major depressive disorder, single episode, unspecified: Secondary | ICD-10-CM | POA: Diagnosis not present

## 2023-06-09 DIAGNOSIS — G8929 Other chronic pain: Secondary | ICD-10-CM | POA: Diagnosis not present

## 2023-06-09 DIAGNOSIS — I129 Hypertensive chronic kidney disease with stage 1 through stage 4 chronic kidney disease, or unspecified chronic kidney disease: Secondary | ICD-10-CM | POA: Diagnosis not present

## 2023-06-09 DIAGNOSIS — G2581 Restless legs syndrome: Secondary | ICD-10-CM | POA: Diagnosis not present

## 2023-06-09 DIAGNOSIS — F17210 Nicotine dependence, cigarettes, uncomplicated: Secondary | ICD-10-CM | POA: Diagnosis not present

## 2023-06-24 ENCOUNTER — Other Ambulatory Visit: Payer: Self-pay | Admitting: Family Medicine

## 2023-06-24 DIAGNOSIS — N938 Other specified abnormal uterine and vaginal bleeding: Secondary | ICD-10-CM

## 2023-07-15 IMAGING — CT CT MAXILLOFACIAL W/ CM
3 series · 16 of 47 positions shown, 19 images · IV contrast (APPLIED)
Comparison: None.

CLINICAL DATA: Facial abscess

EXAM:
CT MAXILLOFACIAL WITH CONTRAST
TECHNIQUE: Multidetector CT imaging of the maxillofacial structures was
performed with intravenous contrast. Multiplanar CT image
reconstructions were also generated.

[Series 2: 1 max soft · axial · 0.29mm/px · z∈[+880,+1056]mm · 10 of 104 slices shown, 13 images]
[im 8/104  brain]
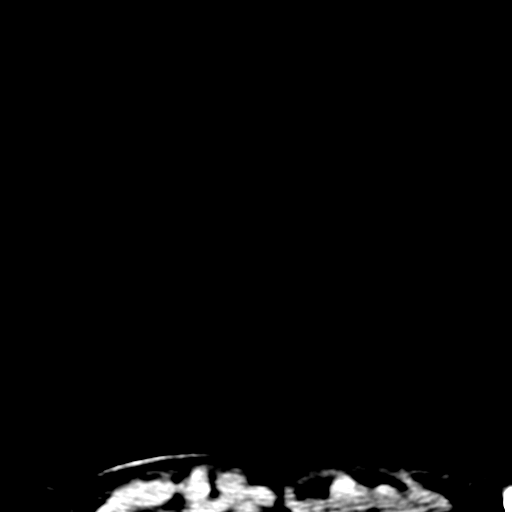
[im 8/104  bone]
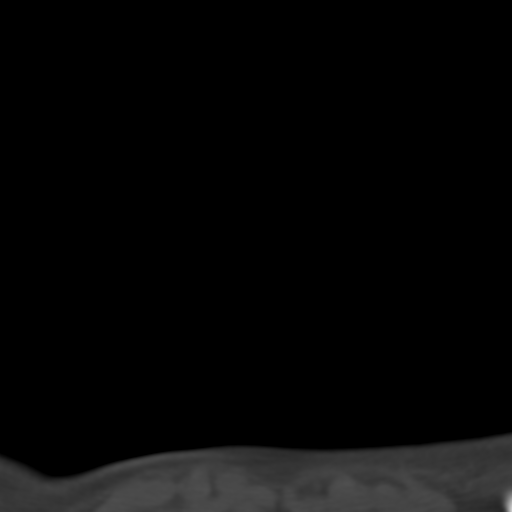
[im 18/104  bone]
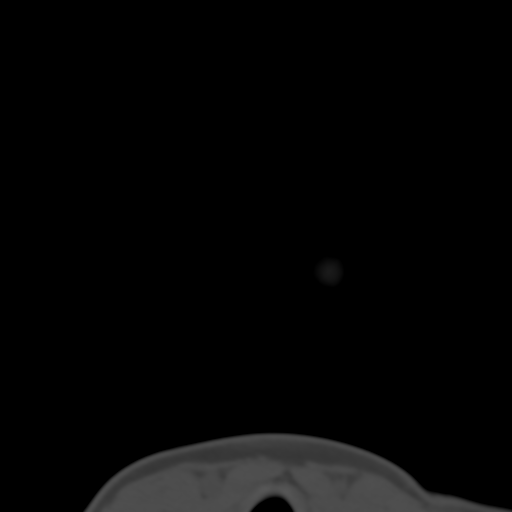
[im 29/104  bone]
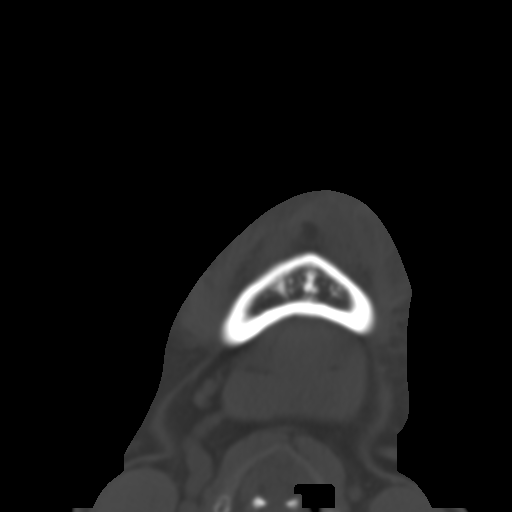
[im 36/104  bone]
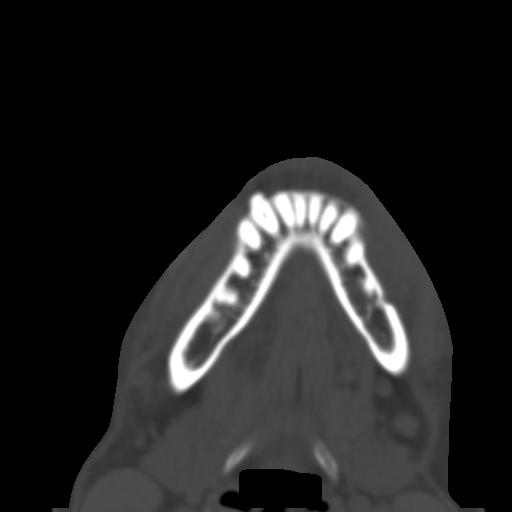
[im 47/104  brain]
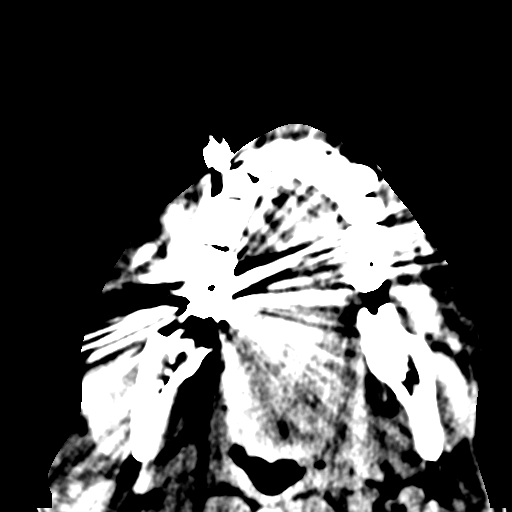
[im 47/104  bone]
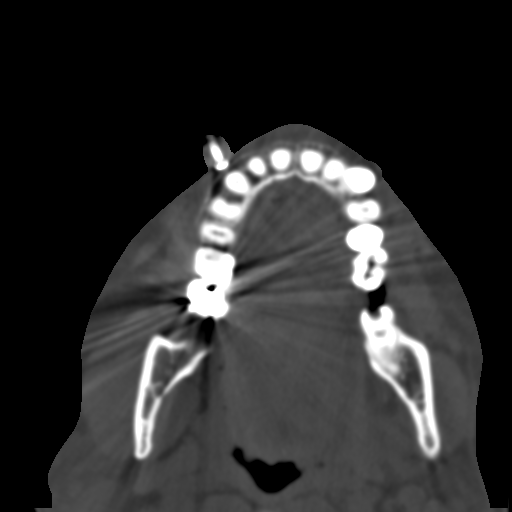
[im 57/104  bone]
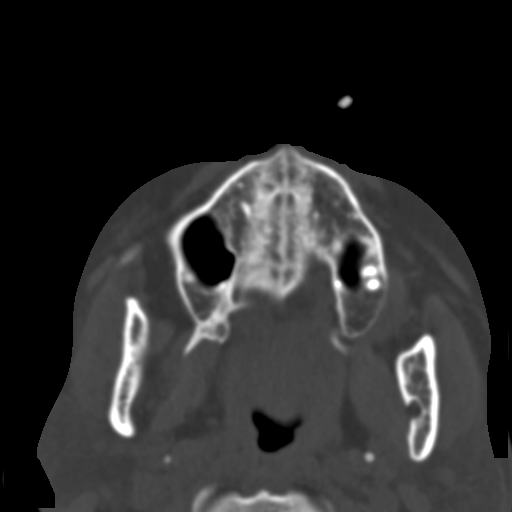
[im 68/104  bone]
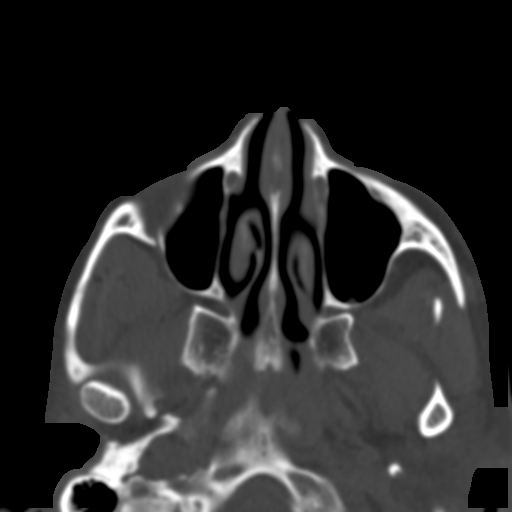
[im 79/104  bone]
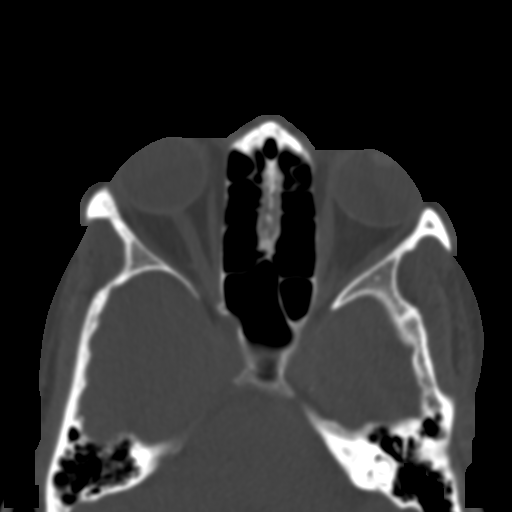
[im 86/104  brain]
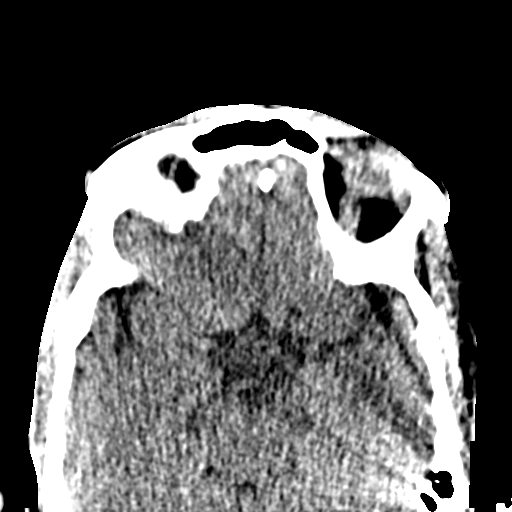
[im 86/104  bone]
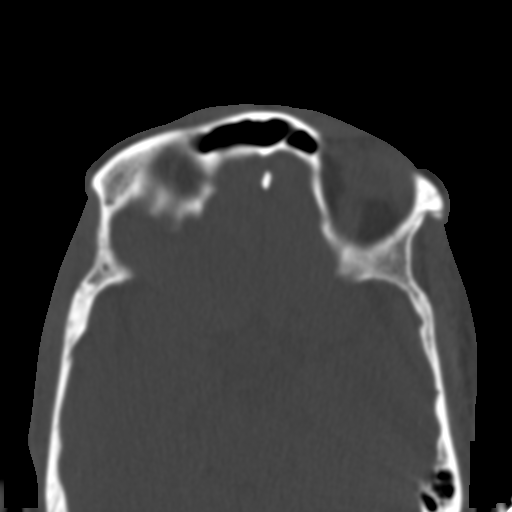
[im 96/104  bone]
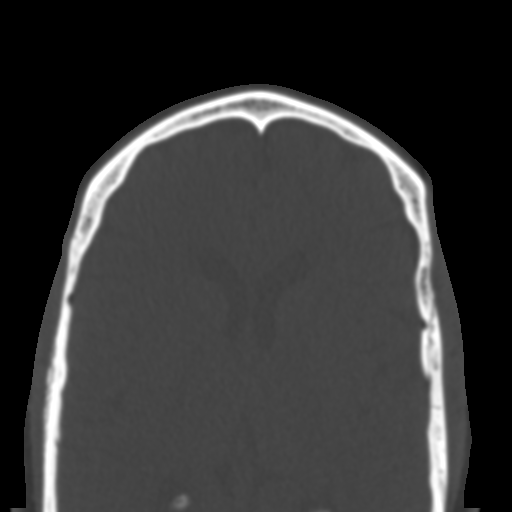

[Series 6: coronal soft · coronal · 0.40mm/px · 3 of 99 slices shown]
[im 33/99  bone]
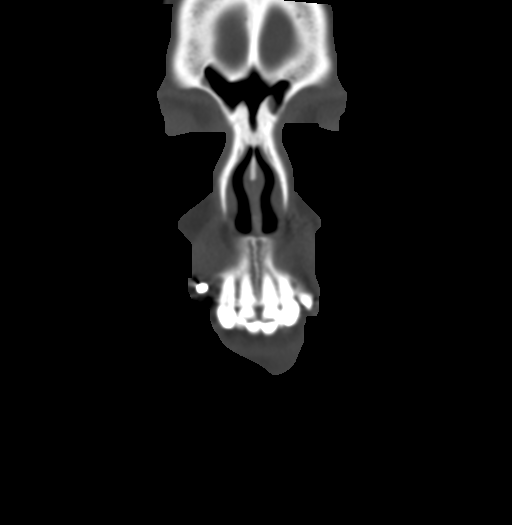
[im 44/99  bone]
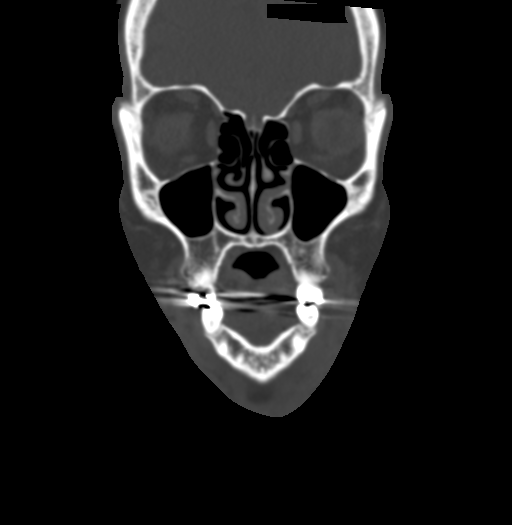
[im 55/99  bone]
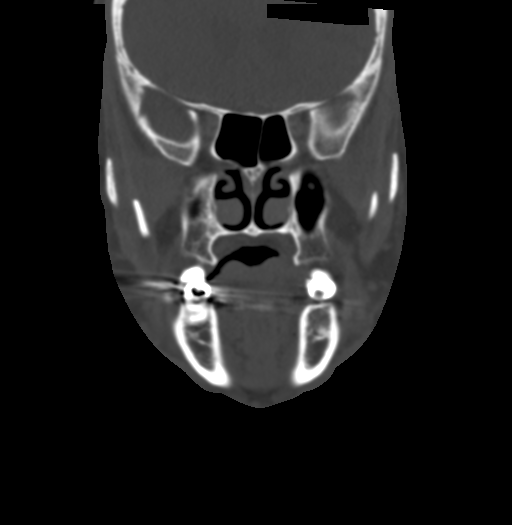

[Series 8: sagittal soft · sagittal · 0.38mm/px · 3 of 102 slices shown]
[im 34/102  bone]
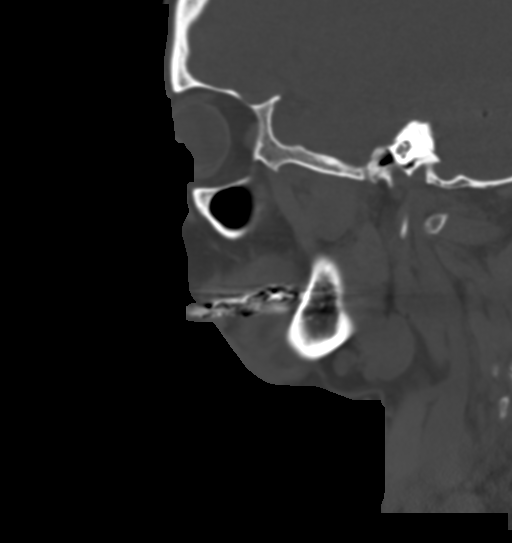
[im 51/102  bone]
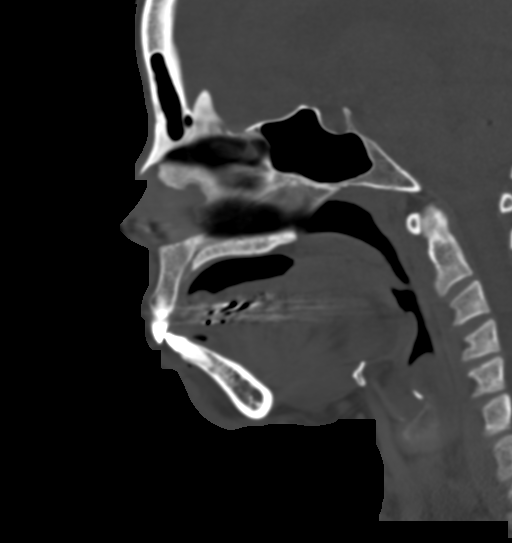
[im 68/102  bone]
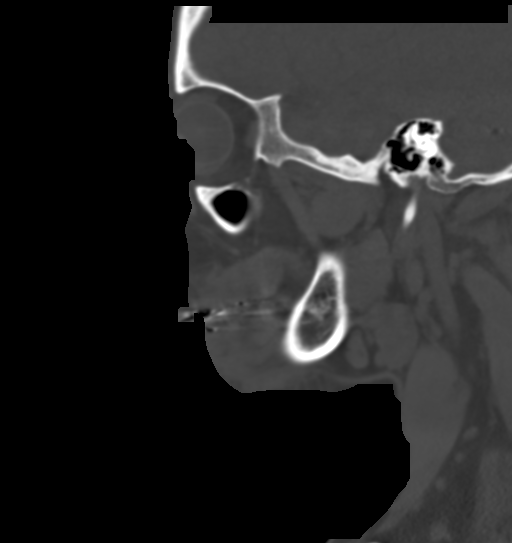

[16 of 47 positions shown; findings below may reference images not displayed]

RADIATION DOSE REDUCTION: This exam was performed according to the
departmental dose-optimization program which includes automated
exposure control, adjustment of the mA and/or kV according to
patient size and/or use of iterative reconstruction technique.

CONTRAST:  75mL OMNIPAQUE IOHEXOL 300 MG/ML  SOLN
FINDINGS: Osseous: No facial or mandibular fracture.

Orbits: The globes are intact. Normal appearance of the intra- and
extraconal fat. Symmetric extraocular muscles.

Sinuses: No fluid levels or advanced mucosal thickening.

Soft tissues: Left submandibular cutaneous lesion with underlying
edema. No fluid collection.

Limited intracranial: Normal.

Other: None.
IMPRESSION: Left submandibular cutaneous lesion with underlying edema, likely
cellulitis. No fluid collection.

## 2023-07-28 ENCOUNTER — Other Ambulatory Visit: Payer: Self-pay | Admitting: Family Medicine

## 2023-07-29 ENCOUNTER — Encounter (HOSPITAL_COMMUNITY): Payer: Self-pay

## 2023-07-29 ENCOUNTER — Other Ambulatory Visit: Payer: Self-pay

## 2023-07-29 ENCOUNTER — Emergency Department (HOSPITAL_COMMUNITY)
Admission: EM | Admit: 2023-07-29 | Discharge: 2023-07-29 | Disposition: A | Discharge status: Home / Self Care | Payer: 59 | Attending: Emergency Medicine | Admitting: Emergency Medicine

## 2023-07-29 DIAGNOSIS — M25519 Pain in unspecified shoulder: Secondary | ICD-10-CM | POA: Diagnosis not present

## 2023-07-29 DIAGNOSIS — Y92410 Unspecified street and highway as the place of occurrence of the external cause: Secondary | ICD-10-CM | POA: Diagnosis not present

## 2023-07-29 DIAGNOSIS — Z79899 Other long term (current) drug therapy: Secondary | ICD-10-CM | POA: Insufficient documentation

## 2023-07-29 DIAGNOSIS — M25569 Pain in unspecified knee: Secondary | ICD-10-CM | POA: Insufficient documentation

## 2023-07-29 DIAGNOSIS — M546 Pain in thoracic spine: Secondary | ICD-10-CM | POA: Insufficient documentation

## 2023-07-29 DIAGNOSIS — M545 Low back pain, unspecified: Secondary | ICD-10-CM | POA: Diagnosis not present

## 2023-07-29 DIAGNOSIS — M542 Cervicalgia: Secondary | ICD-10-CM | POA: Insufficient documentation

## 2023-07-29 MED ORDER — KETOROLAC TROMETHAMINE 15 MG/ML IJ SOLN
15.0000 mg | Freq: Once | INTRAMUSCULAR | Status: AC
  Administered 2023-07-29: 15 mg via INTRAMUSCULAR
  Filled 2023-07-29: qty 1

## 2023-07-29 MED ORDER — CYCLOBENZAPRINE HCL 10 MG PO TABS: 10.0000 mg | ORAL_TABLET | Freq: Two times a day (BID) | ORAL | 0 refills | Status: AC | PRN

## 2023-07-29 NOTE — Discharge Instructions (Signed)
Return for any problem.  ?
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
## 2023-08-03 ENCOUNTER — Telehealth: Payer: Self-pay | Admitting: Family

## 2023-08-03 NOTE — Telephone Encounter (Signed)
Called pt to schedule HFU; left vm to call office back to schedule. Soonest appt available is Oct. 02 at 10:40am with provider.

## 2023-08-13 ENCOUNTER — Ambulatory Visit (INDEPENDENT_AMBULATORY_CARE_PROVIDER_SITE_OTHER): Payer: 59 | Admitting: Family Medicine

## 2023-08-13 ENCOUNTER — Encounter: Payer: Self-pay | Admitting: Family Medicine

## 2023-08-13 VITALS — BP 138/96 | HR 99 | Temp 98.0°F | Resp 16 | Ht 62.0 in | Wt 207.0 lb

## 2023-08-13 DIAGNOSIS — N938 Other specified abnormal uterine and vaginal bleeding: Secondary | ICD-10-CM

## 2023-08-13 DIAGNOSIS — I10 Essential (primary) hypertension: Secondary | ICD-10-CM | POA: Diagnosis not present

## 2023-08-13 MED ORDER — LOSARTAN POTASSIUM 50 MG PO TABS
50.0000 mg | ORAL_TABLET | Freq: Every day | ORAL | 0 refills | Status: DC
Start: 2023-08-13 — End: 2023-08-14

## 2023-08-13 MED ORDER — NAPROXEN 500 MG PO TABS
500.0000 mg | ORAL_TABLET | Freq: Two times a day (BID) | ORAL | 0 refills | Status: DC
Start: 2023-08-13 — End: 2023-10-31

## 2023-08-14 ENCOUNTER — Other Ambulatory Visit: Payer: Self-pay | Admitting: Family Medicine

## 2023-08-14 DIAGNOSIS — I10 Essential (primary) hypertension: Secondary | ICD-10-CM

## 2023-08-14 MED ORDER — LOSARTAN POTASSIUM 50 MG PO TABS
50.0000 mg | ORAL_TABLET | Freq: Every day | ORAL | 0 refills | Status: DC
Start: 2023-08-14 — End: 2023-10-02

## 2023-08-15 ENCOUNTER — Encounter: Payer: Self-pay | Admitting: Family Medicine
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
## 2023-08-27 ENCOUNTER — Telehealth: Payer: Self-pay

## 2023-08-27 ENCOUNTER — Telehealth: Payer: Self-pay | Admitting: Family

## 2023-08-27 NOTE — Telephone Encounter (Signed)
Transition Care Management Follow-up Telephone Call Date of discharge and from where: 07/29/2023 [facility] How have you been since you were released from the hospital? Patient stated she is feeling better but still has some soreness. Any questions or concerns? No  Items Reviewed: Did the pt receive and understand the discharge instructions provided? Yes  Medications obtained and verified? Yes  Other?  Emailed Environmental manager to patient's email, sent [phone] referral to local food bank Any new allergies since your discharge? No  Dietary orders reviewed? Yes Do you have support at home? Yes   Follow up appointments reviewed:  PCP Hospital f/u appt confirmed? Yes  Scheduled to see Georganna Skeans, MD on 08/13/2023 @ [facility]. Specialist Hospital f/u appt confirmed? No  Scheduled to see  on  @ . Are transportation arrangements needed? No  If their condition worsens, is the pt aware to call PCP or go to the Emergency Dept.? Yes Was the patient provided with contact information for the PCP's office or ED? Yes Was to pt encouraged to call back with questions or concerns? Yes   Hendryx Ricke [facility]  [facility], [facility] Guide Direct Dial: [phone]  Website: [email or web address]

## 2023-08-27 NOTE — Telephone Encounter (Signed)
Transition Care Management Unsuccessful Follow-up Telephone Call  Date of discharge and from where:  07/29/2023 [facility]  Attempts:  1st Attempt  Reason for unsuccessful TCM follow-up call:  Left voice message  Ashley Orr [facility]  [facility], [facility] Resource Care Guide Direct Dial: [phone]  Website: [email or web address]

## 2023-09-03 ENCOUNTER — Inpatient Hospital Stay: Payer: 59 | Attending: Hematology and Oncology | Admitting: Hematology and Oncology

## 2023-09-03 ENCOUNTER — Inpatient Hospital Stay: Payer: 59 | Admitting: Hematology

## 2023-09-03 VITALS — BP 150/87 | HR 105 | Temp 97.3°F | Resp 18 | Ht 62.0 in | Wt 218.4 lb

## 2023-09-03 DIAGNOSIS — D500 Iron deficiency anemia secondary to blood loss (chronic): Secondary | ICD-10-CM | POA: Diagnosis not present

## 2023-09-03 DIAGNOSIS — G4700 Insomnia, unspecified: Secondary | ICD-10-CM | POA: Diagnosis not present

## 2023-09-03 DIAGNOSIS — Z823 Family history of stroke: Secondary | ICD-10-CM | POA: Diagnosis not present

## 2023-09-03 DIAGNOSIS — E039 Hypothyroidism, unspecified: Secondary | ICD-10-CM | POA: Insufficient documentation

## 2023-09-03 DIAGNOSIS — Z862 Personal history of diseases of the blood and blood-forming organs and certain disorders involving the immune mechanism: Secondary | ICD-10-CM | POA: Insufficient documentation

## 2023-09-03 DIAGNOSIS — R635 Abnormal weight gain: Secondary | ICD-10-CM | POA: Insufficient documentation

## 2023-09-03 DIAGNOSIS — F17210 Nicotine dependence, cigarettes, uncomplicated: Secondary | ICD-10-CM | POA: Insufficient documentation

## 2023-09-03 DIAGNOSIS — Z86718 Personal history of other venous thrombosis and embolism: Secondary | ICD-10-CM | POA: Diagnosis not present

## 2023-09-03 DIAGNOSIS — Z8249 Family history of ischemic heart disease and other diseases of the circulatory system: Secondary | ICD-10-CM | POA: Diagnosis not present

## 2023-09-03 LAB — CMP (CANCER CENTER ONLY)
ALT: 10 U/L (ref 0–44)
AST: 17 U/L (ref 15–41)
Albumin: 3.8 g/dL (ref 3.5–5.0)
Alkaline Phosphatase: 83 U/L (ref 38–126)
Anion gap: 6 (ref 5–15)
BUN: 10 mg/dL (ref 6–20)
CO2: 27 mmol/L (ref 22–32)
Calcium: 9.3 mg/dL (ref 8.9–10.3)
Chloride: 105 mmol/L (ref 98–111)
Creatinine: 0.83 mg/dL (ref 0.44–1.00)
GFR, Estimated: >60 mL/min (ref >60)
Glucose, Bld: 121 mg/dL — ABNORMAL HIGH (ref 70–99)
Potassium: 3.3 mmol/L — ABNORMAL LOW (ref 3.5–5.1)
Sodium: 138 mmol/L (ref 135–145)
Total Bilirubin: 0.3 mg/dL (ref 0.3–1.2)
Total Protein: 6.9 g/dL (ref 6.5–8.1)

## 2023-09-03 LAB — CBC WITH DIFFERENTIAL/PLATELET
Abs Immature Granulocytes: 0.03 10*3/uL (ref 0.00–0.07)
Basophils Absolute: 0.1 10*3/uL (ref 0.0–0.1)
Basophils Relative: 0 %
Eosinophils Absolute: 0.2 10*3/uL (ref 0.0–0.5)
Eosinophils Relative: 2 %
HCT: 36.3 % (ref 36.0–46.0)
Hemoglobin: 11.4 g/dL — ABNORMAL LOW (ref 12.0–15.0)
Immature Granulocytes: 0 %
Lymphocytes Relative: 11 %
Lymphs Abs: 1.3 10*3/uL (ref 0.7–4.0)
MCH: 26.6 pg (ref 26.0–34.0)
MCHC: 31.4 g/dL (ref 30.0–36.0)
MCV: 84.8 fL (ref 80.0–100.0)
Monocytes Absolute: 0.6 10*3/uL (ref 0.1–1.0)
Monocytes Relative: 5 %
Neutro Abs: 9.4 10*3/uL — ABNORMAL HIGH (ref 1.7–7.7)
Neutrophils Relative %: 82 %
Platelets: 305 10*3/uL (ref 150–400)
RBC: 4.28 MIL/uL (ref 3.87–5.11)
RDW: 16.6 % — ABNORMAL HIGH (ref 11.5–15.5)
WBC: 11.6 10*3/uL — ABNORMAL HIGH (ref 4.0–10.5)
nRBC: 0 % (ref 0.0–0.2)

## 2023-09-03 LAB — IRON AND IRON BINDING CAPACITY (CC-WL,HP ONLY)
Iron: 18 ug/dL — ABNORMAL LOW (ref 28–170)
Saturation Ratios: 4 % — ABNORMAL LOW (ref 10.4–31.8)
TIBC: 511 ug/dL — ABNORMAL HIGH (ref 250–450)
UIBC: 493 ug/dL — ABNORMAL HIGH (ref 148–442)

## 2023-09-03 LAB — FERRITIN: Ferritin: 5 ng/mL — ABNORMAL LOW (ref 11–307)
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
## 2023-09-05 ENCOUNTER — Telehealth: Payer: Self-pay

## 2023-09-05 NOTE — Telephone Encounter (Signed)
Attempted to call pt to give below recommendation per MD. LVM for call back.

## 2023-09-05 NOTE — Telephone Encounter (Signed)
-----   Message from Dalzell Iruku sent at 09/03/2023  2:28 PM EDT ----- Ferritin pending but labs so far are suggestive of iron deficiency. Can you talk to her and see if she can start taking OTC ferrous sulfate 65/325 every other day, may need stool softeners if constipated.  Thanks,

## 2023-09-05 NOTE — Telephone Encounter (Signed)
Pt called back and was given instructions per MD. She verbalized agreement and understanding but asks when/if she should follow up. Pt does not currently have f/u scheduled. Message sent to MD to advise.

## 2023-09-05 NOTE — Progress Notes (Signed)
Ashley Orr Attempted to call pt. LVM for call back

## 2023-09-12 ENCOUNTER — Ambulatory Visit (INDEPENDENT_AMBULATORY_CARE_PROVIDER_SITE_OTHER): Payer: 59

## 2023-09-12 ENCOUNTER — Telehealth: Payer: Self-pay | Admitting: Family

## 2023-09-12 DIAGNOSIS — Z23 Encounter for immunization: Secondary | ICD-10-CM

## 2023-09-12 NOTE — Telephone Encounter (Signed)
Called pt and left vm to call office back to schedule appt for TB test

## 2023-09-14 DIAGNOSIS — Z111 Encounter for screening for respiratory tuberculosis: Secondary | ICD-10-CM

## 2023-09-14 LAB — TB SKIN TEST
Induration: 0 mm
TB Skin Test: NEGATIVE

## 2023-09-17 ENCOUNTER — Telehealth: Payer: Self-pay | Admitting: *Deleted

## 2023-09-18 NOTE — Telephone Encounter (Signed)
Message appears with no information. Only pharmacy listed. Please let me know how I can further assist. Thank you.

## 2023-09-20 ENCOUNTER — Other Ambulatory Visit: Payer: Self-pay

## 2023-09-20 ENCOUNTER — Other Ambulatory Visit (HOSPITAL_COMMUNITY): Payer: Self-pay

## 2023-09-20 DIAGNOSIS — F32A Depression, unspecified: Secondary | ICD-10-CM

## 2023-09-20 MED ORDER — CITALOPRAM HYDROBROMIDE 40 MG PO TABS
40.0000 mg | ORAL_TABLET | Freq: Every day | ORAL | 0 refills | Status: DC
  Filled 2023-09-20: qty 90, 90d supply, fill #0

## 2023-09-21 ENCOUNTER — Other Ambulatory Visit (HOSPITAL_COMMUNITY): Payer: Self-pay

## 2023-09-27 ENCOUNTER — Telehealth: Payer: Self-pay | Admitting: Family
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
## 2023-09-27 NOTE — Telephone Encounter (Signed)
Scheduled on 11/12.

## 2023-10-02 ENCOUNTER — Ambulatory Visit (INDEPENDENT_AMBULATORY_CARE_PROVIDER_SITE_OTHER): Payer: 59 | Admitting: Family

## 2023-10-02 ENCOUNTER — Encounter: Payer: Self-pay | Admitting: Family

## 2023-10-02 VITALS — BP 138/93 | HR 83 | Temp 98.6°F | Ht 62.0 in | Wt 218.4 lb

## 2023-10-02 DIAGNOSIS — Z13220 Encounter for screening for lipoid disorders: Secondary | ICD-10-CM | POA: Diagnosis not present

## 2023-10-02 DIAGNOSIS — Z7689 Persons encountering health services in other specified circumstances: Secondary | ICD-10-CM | POA: Diagnosis not present

## 2023-10-02 DIAGNOSIS — I10 Essential (primary) hypertension: Secondary | ICD-10-CM

## 2023-10-02 DIAGNOSIS — Z6839 Body mass index (BMI) 39.0-39.9, adult: Secondary | ICD-10-CM

## 2023-10-02 DIAGNOSIS — D509 Iron deficiency anemia, unspecified: Secondary | ICD-10-CM | POA: Diagnosis not present

## 2023-10-02 DIAGNOSIS — F32A Depression, unspecified: Secondary | ICD-10-CM

## 2023-10-02 DIAGNOSIS — Z131 Encounter for screening for diabetes mellitus: Secondary | ICD-10-CM | POA: Diagnosis not present

## 2023-10-02 MED ORDER — LOSARTAN POTASSIUM 50 MG PO TABS: 50.0000 mg | ORAL_TABLET | Freq: Every day | ORAL | 0 refills | Status: AC

## 2023-10-02 MED ORDER — PHENTERMINE HCL 15 MG PO CAPS: 15.0000 mg | ORAL_CAPSULE | ORAL | 0 refills | Status: AC

## 2023-10-02 MED ORDER — HYDROCHLOROTHIAZIDE 25 MG PO TABS: 25.0000 mg | ORAL_TABLET | Freq: Every day | ORAL | 0 refills | Status: AC

## 2023-10-02 MED ORDER — CITALOPRAM HYDROBROMIDE 40 MG PO TABS: 40.0000 mg | ORAL_TABLET | Freq: Every day | ORAL | 0 refills | Status: AC

## 2023-10-02 MED ORDER — BUPROPION HCL ER (SR) 150 MG PO TB12: 150.0000 mg | ORAL_TABLET | Freq: Every day | ORAL | 0 refills | Status: AC

## 2023-10-02 MED ORDER — TRAZODONE HCL 100 MG PO TABS
100.0000 mg | ORAL_TABLET | Freq: Every day | ORAL | 0 refills | Status: AC
Start: 2023-10-02 — End: 2023-12-31

## 2023-10-02 NOTE — Progress Notes (Signed)
Patient ID: Ashley Orr, female    DOB: 01/19/1971  MRN: [id number]  CC: Chronic Conditions Follow-Up  Subjective: Ashley Orr is a 52 y.o. female who presents for chronic conditions follow-up.   Her concerns today include: - Doing well on Losartan and Hydrochlorothiazide, no issues/concerns. She does not complain of red flag symptoms such as but not limited to chest pain, shortness of breath, worst headache of life, nausea/vomiting.  - Doing well on Citalopram, Trazodone, and Bupropion, no issues/concerns. She denies thoughts of self-harm, suicidal ideations, homicidal ideations. - Reports doing well on iron supplements. States Hematology Oncology told her to return to Primary Care for follow-up.  - States recently seen by Hematology Oncology and told she no longer had a blood clot.  - Would like to restart Phentermine. She is trying to watch what she eats. No exercise outside of normal routine. States in the past she established with [facility] and did not have a good patient experience.   Patient Active Problem List   Diagnosis Date Noted   Class 2 severe obesity with serious comorbidity and body mass index (BMI) of 36.0 to 36.9 in adult [facility] [address] [address]) 01/25/2023   Intramural and subserous leiomyoma of uterus 12/01/2022   Acute pulmonary embolism without acute cor pulmonale (HCC) 06/06/2022   Major depressive disorder 06/06/2022   Acute deep vein thrombosis (DVT) of tibial vein of left lower extremity (HCC) 06/06/2022   Dysfunctional uterine bleeding 04/13/2022   Cellulitis of face 12/17/2021   Iron deficiency anemia 01/13/2021   Smoker 01/12/2021   MDD (major depressive disorder), recurrent episode, moderate (HCC) 01/12/2021   Anemia 01/12/2021   Insomnia 01/12/2021   Stress incontinence 01/12/2021   Health care maintenance 01/01/2020   Primary hypertension 12/29/2019   Acquired hypothyroidism 12/29/2019     Current Outpatient Medications on File  Prior to Visit  Medication Sig Dispense Refill   Ascorbic Acid (VITAMIN C PO) Take 60 mg by mouth daily.     CASCARA SAGRADA PO Take 1 tablet by mouth daily as needed (Constipation).     Cranberry 500 MG CAPS Take 2 capsules by mouth daily. Probiotic-Prebiotic With vitamin C 60 mg     Cyanocobalamin (VITAMIN B-12) 5000 MCG SUBL Place 5,000 mcg under the tongue daily.     cyclobenzaprine (FLEXERIL) 10 MG tablet Take 1 tablet (10 mg total) by mouth 2 (two) times daily as needed for muscle spasms. 20 tablet 0   levothyroxine (SYNTHROID) 200 MCG tablet Take 200 mcg by mouth every morning.     levothyroxine (SYNTHROID) 50 MCG tablet Take 50 mcg by mouth every morning.     naproxen (NAPROSYN) 500 MG tablet Take 1 tablet (500 mg total) by mouth 2 (two) times daily with a meal. 60 tablet 0   norethindrone (AYGESTIN) 5 MG tablet TAKE 2 TABLETS BY MOUTH EVERY DAY 60 tablet 5   ondansetron (ZOFRAN) 4 MG tablet Take 1 tablet (4 mg total) by mouth every 4 (four) hours as needed for nausea or vomiting. 30 tablet 0   OVER THE COUNTER MEDICATION Take 1 Scoop by mouth daily. Total Beets Juice 16 oz with water     Cholecalciferol 50 MCG (2000 UT) CAPS Take 2,000 Units by mouth daily. (Patient not taking: Reported on 10/02/2023)     ferrous sulfate 325 (65 FE) MG tablet Take 325 mg by mouth daily with breakfast. (Patient not taking: Reported on 10/02/2023)     NON FORMULARY Hormone moon tea  polyethylene glycol powder (GLYCOLAX/MIRALAX) 17 GM/SCOOP powder Take 17 g by mouth daily as needed for mild constipation. 238 g 0   No current facility-administered medications on file prior to visit.    Allergies  Allergen Reactions   Codeine Anaphylaxis    Social History   Socioeconomic History   Marital status: Single    Spouse name: Not on file   Number of children: Not on file   Years of education: Not on file   Highest education level: Associate degree: occupational, Scientist, product/process development, or vocational program   Occupational History   Not on file  Tobacco Use   Smoking status: Every Day    Current packs/day: 0.75    Types: Cigarettes    Passive exposure: Current   Smokeless tobacco: Never  Vaping Use   Vaping status: Never Used  Substance and Sexual Activity   Alcohol use: Not Currently   Drug use: Not Currently   Sexual activity: Yes    Birth control/protection: Surgical  Other Topics Concern   Not on file  Social History Narrative   Not on file   Social Determinants of Health   Financial Resource Strain: High Risk (05/07/2023)   Overall Financial Resource Strain (CARDIA)    Difficulty of Paying Living Expenses: Hard  Food Insecurity: Food Insecurity Present (10/02/2023)   Hunger Vital Sign    Worried About Running Out of Food in the Last Year: Sometimes true    Ran Out of Food in the Last Year: Sometimes true  Transportation Needs: No Transportation Needs (05/31/2023)   PRAPARE - Administrator, Civil Service (Medical): No    Lack of Transportation (Non-Medical): No  Physical Activity: Inactive (10/02/2023)   Exercise Vital Sign    Days of Exercise per Week: 0 days    Minutes of Exercise per Session: 0 min  Stress: Stress Concern Present (05/07/2023)   [employer] of Occupational Health - Occupational Stress Questionnaire    Feeling of Stress : To some extent  Social Connections: Unknown (05/07/2023)   Social Connection and Isolation Panel [NHANES]    Frequency of Communication with Friends and Family: More than three times a week    Frequency of Social Gatherings with Friends and Family: Patient declined    Attends Religious Services: Patient declined    Active Member of Clubs or Organizations: No    Attends Engineer, structural: Not on file    Marital Status: Patient declined  Intimate Partner Violence: Unknown (02/23/2022)   Received from [employer], [facility]   HITS    Physically Hurt: Not on file    Insult or Talk Down To: Not on file     Threaten Physical Harm: Not on file    Scream or Curse: Not on file    Family History  Problem Relation Age of Onset   Stroke Mother    COPD Mother    Hypertension Mother    Heart disease Father    Diabetes Father    Hypertension Father    Hypertension Brother    Diabetes Brother     Past Surgical History:  Procedure Laterality Date   APPENDECTOMY     CESAREAN SECTION     DILATATION & CURETTAGE/HYSTEROSCOPY WITH MYOSURE N/A 05/30/2022   Procedure: ATTEMPTED DILATATION & CURETTAGE/HYSTEROSCOPY WITH MYOSURE;  Surgeon: Reva Bores, MD;  Location: [facility] OR;  Service: Gynecology;  Laterality: N/A;   DILITATION & CURRETTAGE/HYSTROSCOPY WITH HYDROTHERMAL ABLATION N/A 05/30/2022   Procedure: DILATATION & CURETTAGE/HYSTEROSCOPY WITH  ATTEMPTED HYDROTHERMAL ABLATION;  Surgeon: Reva Bores, MD;  Location: [facility] OR;  Service: Gynecology;  Laterality: N/A;   IR 3D INDEPENDENT WKST  03/08/2023   IR ANGIOGRAM PELVIS SELECTIVE OR SUPRASELECTIVE  03/08/2023   IR ANGIOGRAM SELECTIVE EACH ADDITIONAL VESSEL  03/08/2023   IR ANGIOGRAM SELECTIVE EACH ADDITIONAL VESSEL  03/08/2023   IR ANGIOGRAM SELECTIVE EACH ADDITIONAL VESSEL  03/08/2023   IR EMBO TUMOR ORGAN ISCHEMIA INFARCT INC GUIDE ROADMAPPING  03/08/2023   IR RADIOLOGIST EVAL & MGMT  01/11/2023   IR RADIOLOGIST EVAL & MGMT  04/06/2023   IR US GUIDE VASC ACCESS LEFT  03/08/2023   TUBAL LIGATION      ROS: Review of Systems Negative except as stated above  PHYSICAL EXAM: BP (!) 138/93   Pulse 83   Temp 98.6 F (37 C) (Oral)   Ht 5\' 2"  (1.575 m)   Wt 218 lb 6.4 oz (99.1 kg)   LMP 08/28/2023 (Exact Date)   SpO2 97%   BMI 39.95 kg/m   Physical Exam HENT:     Head: Normocephalic and atraumatic.     Nose: Nose normal.     Mouth/Throat:     Mouth: Mucous membranes are moist.     Pharynx: Oropharynx is clear.  Eyes:     Extraocular Movements: Extraocular movements intact.     Conjunctiva/sclera: Conjunctivae normal.     Pupils: Pupils are  equal, round, and reactive to light.  Cardiovascular:     Rate and Rhythm: Normal rate and regular rhythm.     Pulses: Normal pulses.     Heart sounds: Normal heart sounds.  Pulmonary:     Effort: Pulmonary effort is normal.     Breath sounds: Normal breath sounds.  Musculoskeletal:        General: Normal range of motion.     Cervical back: Normal range of motion and neck supple.  Neurological:     General: No focal deficit present.     Mental Status: She is alert and oriented to person, place, and time.  Psychiatric:        Mood and Affect: Mood normal.        Behavior: Behavior normal.     ASSESSMENT AND PLAN: 1. Primary hypertension - Blood pressure relative to goal.  - Continue Losartan and Hydrochlorothiazide as prescribed.  - Counseled on blood pressure goal of less than 130/80, low-sodium, DASH diet, medication compliance, and 150 minutes of moderate intensity exercise per week as tolerated. Counseled on medication adherence and adverse effects. - Follow-up with primary provider in 3 months or sooner if needed.  - losartan (COZAAR) 50 MG tablet; Take 1 tablet (50 mg total) by mouth daily.  Dispense: 90 tablet; Refill: 0 - hydrochlorothiazide (HYDRODIURIL) 25 MG tablet; Take 1 tablet (25 mg total) by mouth daily.  Dispense: 90 tablet; Refill: 0  2. Depression, unspecified depression type - Patient denies thoughts of self-harm, suicidal ideations, homicidal ideations. - Continue Citalopram, Trazodone, and Bupropion as prescribed. - Follow-up with primary provider in 3 months or sooner if needed.  - citalopram (CELEXA) 40 MG tablet; Take 1 tablet (40 mg total) by mouth daily.  Dispense: 90 tablet; Refill: 0 - traZODone (DESYREL) 100 MG tablet; Take 1 tablet (100 mg total) by mouth at bedtime.  Dispense: 90 tablet; Refill: 0 - buPROPion (WELLBUTRIN SR) 150 MG 12 hr tablet; Take 1 tablet (150 mg total) by mouth daily.  Dispense: 90 tablet; Refill: 0  3. Screening cholesterol  level -  Routine screening.  - Lipid panel  4. Diabetes mellitus screening - Routine screening.  - Hemoglobin A1c  5. Iron deficiency anemia, unspecified iron deficiency anemia type - Routine screening.  - CBC  6. Encounter for weight management 7. BMI 39.0-39.9,adult - Phentermine as prescribed. Counseled on medication adherence and adverse effects.  - I did check the [facility] prescription drug database. - Counseled on low-sodium DASH diet and 150 minutes of moderate intensity exercise per week as tolerated to assist with weight management.  - Follow-up with primary provider in 4 weeks or sooner if needed. - phentermine 15 MG capsule; Take 1 capsule (15 mg total) by mouth every morning.  Dispense: 30 capsule; Refill: 0    Patient was given the opportunity to ask questions.  Patient verbalized understanding of the plan and was able to repeat key elements of the plan. Patient was given clear instructions to go to Emergency Department or return to medical center if symptoms don't improve, worsen, or new problems develop.The patient verbalized understanding.   Orders Placed This Encounter  Procedures   CBC   Lipid panel   Hemoglobin A1c     Requested Prescriptions   Signed Prescriptions Disp Refills   losartan (COZAAR) 50 MG tablet 90 tablet 0    Sig: Take 1 tablet (50 mg total) by mouth daily.   hydrochlorothiazide (HYDRODIURIL) 25 MG tablet 90 tablet 0    Sig: Take 1 tablet (25 mg total) by mouth daily.   citalopram (CELEXA) 40 MG tablet 90 tablet 0    Sig: Take 1 tablet (40 mg total) by mouth daily.   traZODone (DESYREL) 100 MG tablet 90 tablet 0    Sig: Take 1 tablet (100 mg total) by mouth at bedtime.   buPROPion (WELLBUTRIN SR) 150 MG 12 hr tablet 90 tablet 0    Sig: Take 1 tablet (150 mg total) by mouth daily.   phentermine 15 MG capsule 30 capsule 0    Sig: Take 1 capsule (15 mg total) by mouth every morning.    Return in about 3 months (around 01/02/2024)  for Follow-Up or next available chronic conditions and 4 weeks weight check.  Rema Fendt, NP

## 2023-10-02 NOTE — Progress Notes (Signed)
Patient needs referral neurology or nerve Dr. She is unsure which one.  Patient needs mediation refilled.

## 2023-10-03 LAB — LIPID PANEL
Chol/HDL Ratio: 3 ratio (ref 0.0–4.4)
Cholesterol, Total: 188 mg/dL (ref 100–199)
HDL: 63 mg/dL (ref >39)
LDL Chol Calc (NIH): 101 mg/dL — ABNORMAL HIGH (ref 0–99)
Triglycerides: 137 mg/dL (ref 0–149)
VLDL Cholesterol Cal: 24 mg/dL (ref 5–40)

## 2023-10-03 LAB — HEMOGLOBIN A1C
Est. average glucose Bld gHb Est-mCnc: 100 mg/dL
Hgb A1c MFr Bld: 5.1 % (ref 4.8–5.6)

## 2023-10-03 LAB — CBC
Hematocrit: 39.4 % (ref 34.0–46.6)
Hemoglobin: 12.6 g/dL (ref 11.1–15.9)
MCH: 27.8 pg (ref 26.6–33.0)
MCHC: 32 g/dL (ref 31.5–35.7)
MCV: 87 fL (ref 79–97)
Platelets: 276 10*3/uL (ref 150–450)
RBC: 4.53 x10E6/uL (ref 3.77–5.28)
RDW: 19.1 % — ABNORMAL HIGH (ref 11.7–15.4)
WBC: 8.7 10*3/uL (ref 3.4–10.8)

## 2023-10-30 ENCOUNTER — Telehealth: Payer: Self-pay | Admitting: Family

## 2023-10-30 ENCOUNTER — Encounter (INDEPENDENT_AMBULATORY_CARE_PROVIDER_SITE_OTHER): Payer: 59 | Admitting: Family

## 2023-10-30 NOTE — Progress Notes (Signed)
Erroneous encounter-disregard

## 2023-10-30 NOTE — Telephone Encounter (Signed)
Called pt and left vm to call office back to reschedule missed appt

## 2023-10-31 ENCOUNTER — Other Ambulatory Visit: Payer: Self-pay | Admitting: Family Medicine

## 2023-10-31 DIAGNOSIS — N938 Other specified abnormal uterine and vaginal bleeding: Secondary | ICD-10-CM

## 2023-11-28 IMAGING — MG MM DIGITAL SCREENING BILAT W/ TOMO AND CAD
8 series · 8 of 24 positions shown · non-contrast
Comparison: None available.

CLINICAL DATA: Screening.

EXAM:
DIGITAL SCREENING BILATERAL MAMMOGRAM WITH TOMOSYNTHESIS AND CAD
TECHNIQUE: Bilateral screening digital craniocaudal and mediolateral oblique
mammograms were obtained. Bilateral screening digital breast
tomosynthesis was performed. The images were evaluated with
computer-aided detection.

[R CC synth-2D]
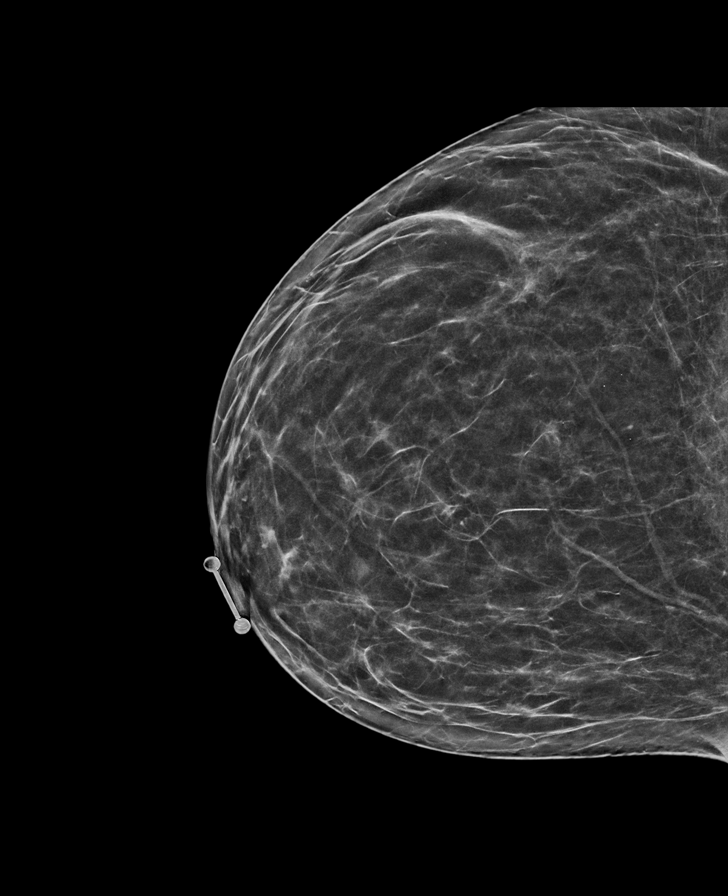

[L CC synth-2D]
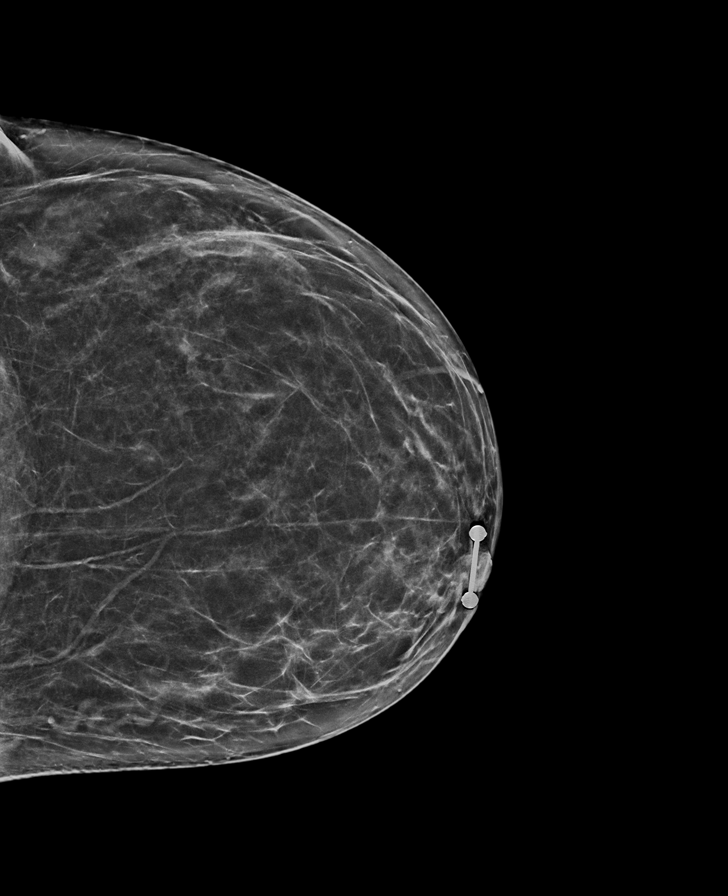

[L MLO synth-2D]
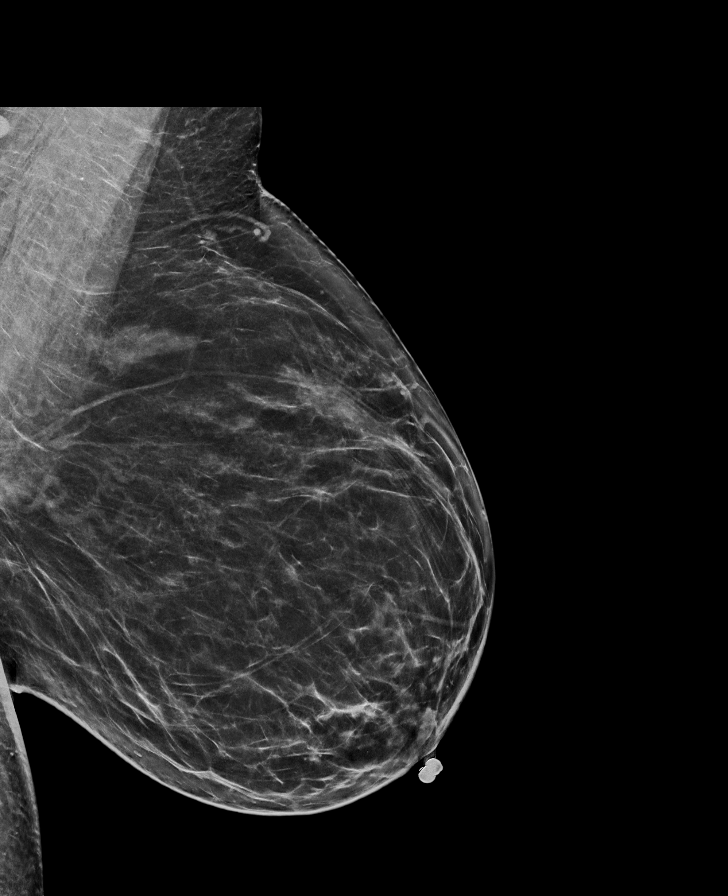

[R MLO synth-2D]
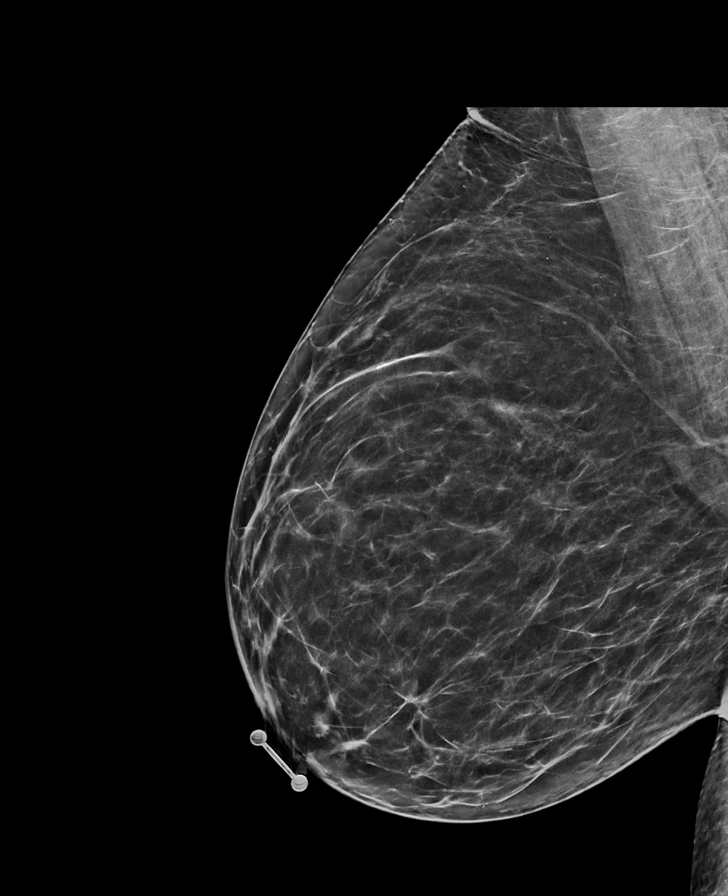

[L CC tomo · tomo slice 33/65.0]
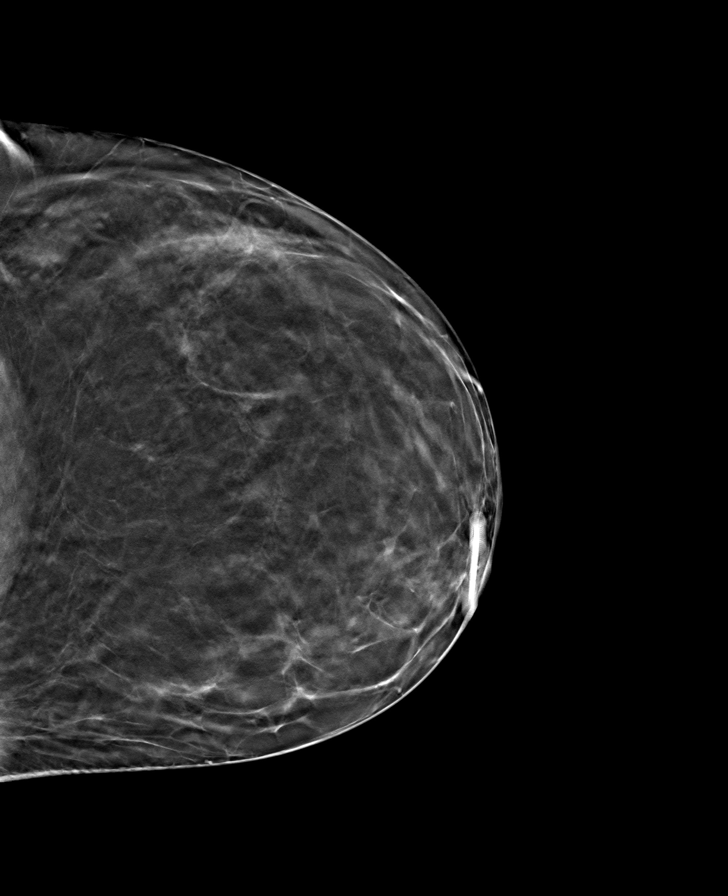

[L MLO tomo · tomo slice 39/77.0]
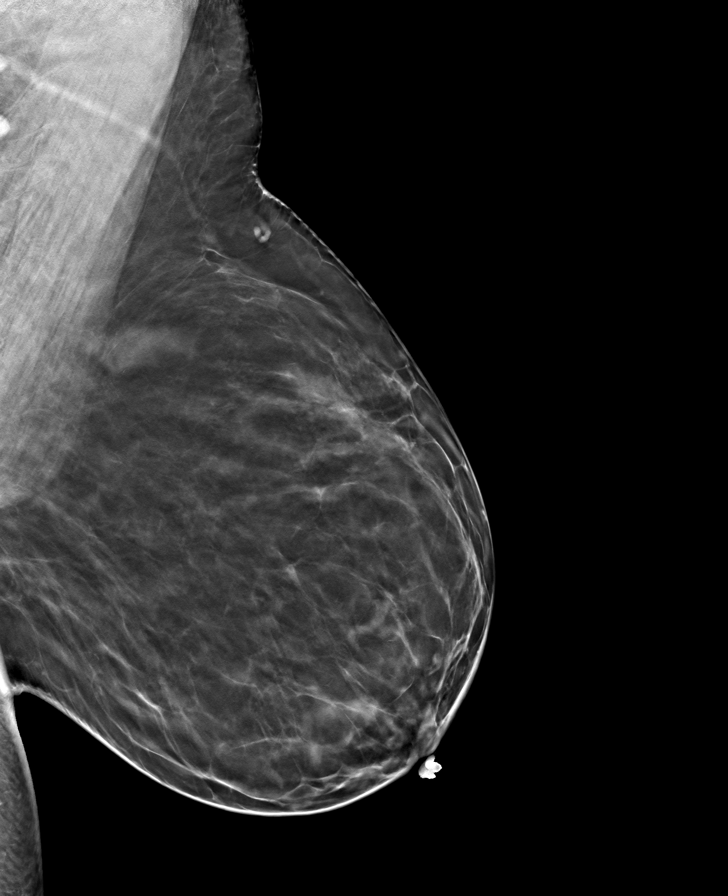

[R MLO tomo · tomo slice 37/72.0]
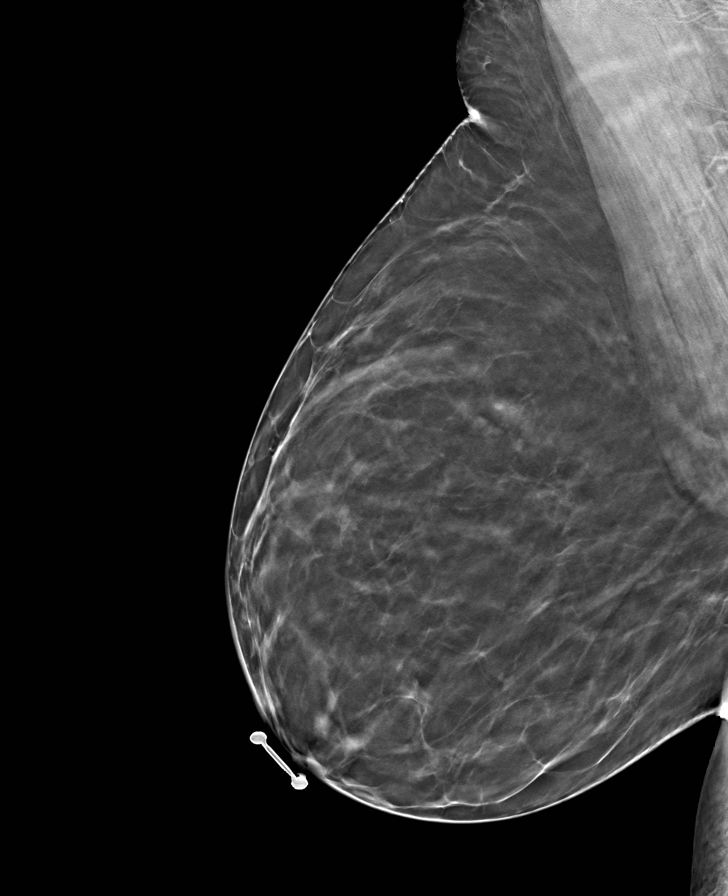

[R CC tomo · tomo slice 33/65.0]
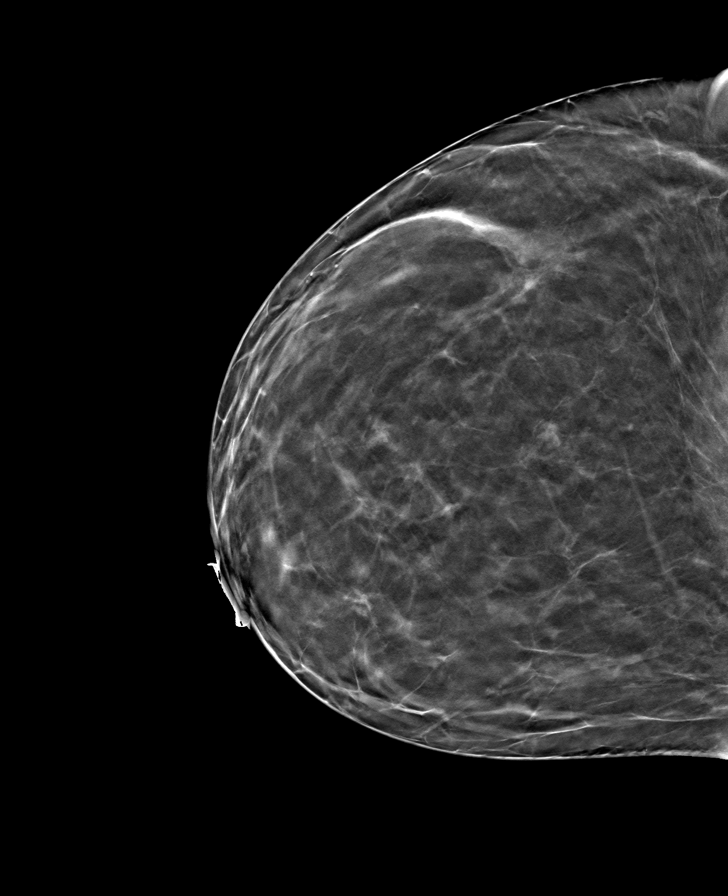

[8 of 24 positions shown; findings below may reference images not displayed]

ACR Breast Density Category b: There are scattered areas of
fibroglandular density.
FINDINGS: There are no findings suspicious for malignancy.
IMPRESSION: No mammographic evidence of malignancy. A result letter of this
screening mammogram will be mailed directly to the patient.

RECOMMENDATION:
Screening mammogram in one year. (Code:GB-Z-FIU)

BI-RADS CATEGORY  1: Negative.

## 2023-11-29 ENCOUNTER — Other Ambulatory Visit: Payer: Self-pay | Admitting: Family Medicine

## 2023-11-29 DIAGNOSIS — N938 Other specified abnormal uterine and vaginal bleeding: Secondary | ICD-10-CM

## 2024-01-02 ENCOUNTER — Encounter: Payer: 59 | Admitting: Family

## 2024-01-02 ENCOUNTER — Telehealth: Payer: Self-pay | Admitting: Family

## 2024-01-02 NOTE — Telephone Encounter (Signed)
Called patient to reschedule missed appointment no answer left voicemail

## 2024-01-02 NOTE — Progress Notes (Signed)
Erroneous encounter-disregard
